# Patient Record
Sex: Male | Born: 1935 | Race: White | Hispanic: No | Marital: Married | Smoking: Former smoker
Health system: Southern US, Community
[De-identification: ages and names within clinical notes are randomized; demographics above are authoritative.]

## PROBLEM LIST (undated history)

## (undated) DIAGNOSIS — F419 Anxiety disorder, unspecified: Secondary | ICD-10-CM

## (undated) DIAGNOSIS — D126 Benign neoplasm of colon, unspecified: Secondary | ICD-10-CM

## (undated) DIAGNOSIS — Z951 Presence of aortocoronary bypass graft: Secondary | ICD-10-CM

## (undated) DIAGNOSIS — C9140 Hairy cell leukemia not having achieved remission: Secondary | ICD-10-CM

## (undated) DIAGNOSIS — R351 Nocturia: Secondary | ICD-10-CM

## (undated) DIAGNOSIS — E119 Type 2 diabetes mellitus without complications: Secondary | ICD-10-CM

## (undated) DIAGNOSIS — K644 Residual hemorrhoidal skin tags: Secondary | ICD-10-CM

## (undated) DIAGNOSIS — R350 Frequency of micturition: Secondary | ICD-10-CM

## (undated) DIAGNOSIS — Z8679 Personal history of other diseases of the circulatory system: Secondary | ICD-10-CM

## (undated) DIAGNOSIS — R3915 Urgency of urination: Secondary | ICD-10-CM

## (undated) DIAGNOSIS — I2510 Atherosclerotic heart disease of native coronary artery without angina pectoris: Secondary | ICD-10-CM

## (undated) DIAGNOSIS — H3530 Unspecified macular degeneration: Secondary | ICD-10-CM

## (undated) DIAGNOSIS — E785 Hyperlipidemia, unspecified: Secondary | ICD-10-CM

## (undated) DIAGNOSIS — Z87442 Personal history of urinary calculi: Secondary | ICD-10-CM

## (undated) DIAGNOSIS — I2089 Other forms of angina pectoris: Secondary | ICD-10-CM

## (undated) DIAGNOSIS — K219 Gastro-esophageal reflux disease without esophagitis: Secondary | ICD-10-CM

## (undated) DIAGNOSIS — Z8719 Personal history of other diseases of the digestive system: Secondary | ICD-10-CM

## (undated) DIAGNOSIS — H6980 Other specified disorders of Eustachian tube, unspecified ear: Secondary | ICD-10-CM

## (undated) DIAGNOSIS — K5730 Diverticulosis of large intestine without perforation or abscess without bleeding: Secondary | ICD-10-CM

## (undated) DIAGNOSIS — I252 Old myocardial infarction: Secondary | ICD-10-CM

## (undated) DIAGNOSIS — I10 Essential (primary) hypertension: Secondary | ICD-10-CM

## (undated) DIAGNOSIS — N320 Bladder-neck obstruction: Secondary | ICD-10-CM

## (undated) DIAGNOSIS — E781 Pure hyperglyceridemia: Secondary | ICD-10-CM

## (undated) DIAGNOSIS — H269 Unspecified cataract: Secondary | ICD-10-CM

## (undated) DIAGNOSIS — L03113 Cellulitis of right upper limb: Secondary | ICD-10-CM

## (undated) DIAGNOSIS — I7300 Raynaud's syndrome without gangrene: Secondary | ICD-10-CM

## (undated) DIAGNOSIS — G4700 Insomnia, unspecified: Secondary | ICD-10-CM

## (undated) DIAGNOSIS — Z8546 Personal history of malignant neoplasm of prostate: Secondary | ICD-10-CM

## (undated) DIAGNOSIS — I208 Other forms of angina pectoris: Secondary | ICD-10-CM

## (undated) DIAGNOSIS — M1990 Unspecified osteoarthritis, unspecified site: Secondary | ICD-10-CM

## (undated) HISTORY — DX: Personal history of other diseases of the circulatory system: Z86.79

## (undated) HISTORY — DX: Pure hyperglyceridemia: E78.1

## (undated) HISTORY — DX: Gastro-esophageal reflux disease without esophagitis: K21.9

## (undated) HISTORY — DX: Atherosclerotic heart disease of native coronary artery without angina pectoris: I25.10

## (undated) HISTORY — DX: Essential (primary) hypertension: I10

## (undated) HISTORY — PX: CORONARY ARTERY BYPASS GRAFT: SHX141

## (undated) HISTORY — PX: CARDIAC CATHETERIZATION: SHX172

## (undated) HISTORY — DX: Diverticulosis of large intestine without perforation or abscess without bleeding: K57.30

## (undated) HISTORY — DX: Raynaud's syndrome without gangrene: I73.00

## (undated) HISTORY — DX: Anxiety disorder, unspecified: F41.9

## (undated) HISTORY — DX: Hyperlipidemia, unspecified: E78.5

## (undated) HISTORY — DX: Residual hemorrhoidal skin tags: K64.4

## (undated) HISTORY — DX: Hairy cell leukemia not having achieved remission: C91.40

## (undated) HISTORY — DX: Other specified disorders of Eustachian tube, unspecified ear: H69.80

## (undated) HISTORY — DX: Type 2 diabetes mellitus without complications: E11.9

## (undated) HISTORY — DX: Unspecified macular degeneration: H35.30

## (undated) HISTORY — DX: Benign neoplasm of colon, unspecified: D12.6

## (undated) HISTORY — DX: Insomnia, unspecified: G47.00

## (undated) HISTORY — DX: Cellulitis of right upper limb: L03.113

---
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
## 1999-04-16 ENCOUNTER — Ambulatory Visit (HOSPITAL_COMMUNITY): Admission: RE | Admit: 1999-04-16 | Discharge: 1999-04-16 | Payer: Self-pay | Admitting: Internal Medicine

## 1999-04-16 ENCOUNTER — Encounter: Payer: Self-pay | Admitting: Internal Medicine

## 1999-08-21 ENCOUNTER — Other Ambulatory Visit: Admission: RE | Admit: 1999-08-21 | Discharge: 1999-08-21 | Payer: Self-pay | Admitting: Gastroenterology

## 1999-08-21 ENCOUNTER — Encounter (INDEPENDENT_AMBULATORY_CARE_PROVIDER_SITE_OTHER): Payer: Self-pay | Admitting: Specialist

## 2000-01-14 HISTORY — PX: DEBRIDEMENT TENNIS ELBOW: SHX1442

## 2002-03-01 ENCOUNTER — Ambulatory Visit (HOSPITAL_COMMUNITY): Admission: RE | Admit: 2002-03-01 | Discharge: 2002-03-01 | Payer: Self-pay | Admitting: Gastroenterology

## 2002-03-01 ENCOUNTER — Encounter: Payer: Self-pay | Admitting: Gastroenterology

## 2003-05-29 ENCOUNTER — Encounter: Admission: RE | Admit: 2003-05-29 | Discharge: 2003-05-29 | Payer: Self-pay | Admitting: Internal Medicine

## 2003-07-07 ENCOUNTER — Ambulatory Visit (HOSPITAL_COMMUNITY): Admission: RE | Admit: 2003-07-07 | Discharge: 2003-07-07 | Payer: Self-pay | Admitting: Gastroenterology

## 2003-09-05 ENCOUNTER — Encounter (INDEPENDENT_AMBULATORY_CARE_PROVIDER_SITE_OTHER): Payer: Self-pay | Admitting: Gastroenterology

## 2003-09-05 ENCOUNTER — Encounter: Payer: Self-pay | Admitting: Gastroenterology

## 2003-09-05 DIAGNOSIS — K5730 Diverticulosis of large intestine without perforation or abscess without bleeding: Secondary | ICD-10-CM

## 2003-09-05 HISTORY — DX: Diverticulosis of large intestine without perforation or abscess without bleeding: K57.30

## 2004-01-14 DIAGNOSIS — Z951 Presence of aortocoronary bypass graft: Secondary | ICD-10-CM

## 2004-01-14 HISTORY — DX: Presence of aortocoronary bypass graft: Z95.1

## 2004-01-24 ENCOUNTER — Ambulatory Visit: Payer: Self-pay | Admitting: Internal Medicine

## 2004-04-22 ENCOUNTER — Inpatient Hospital Stay (HOSPITAL_COMMUNITY): Admission: EM | Admit: 2004-04-22 | Discharge: 2004-05-01 | Payer: Self-pay | Admitting: Emergency Medicine

## 2004-04-22 ENCOUNTER — Ambulatory Visit: Payer: Self-pay | Admitting: Cardiology

## 2004-04-23 HISTORY — PX: CARDIAC CATHETERIZATION: SHX172

## 2004-05-07 ENCOUNTER — Encounter: Admission: RE | Admit: 2004-05-07 | Discharge: 2004-05-07 | Payer: Self-pay | Admitting: Cardiothoracic Surgery

## 2004-05-21 ENCOUNTER — Ambulatory Visit: Payer: Self-pay | Admitting: Cardiology

## 2004-05-31 ENCOUNTER — Ambulatory Visit: Payer: Self-pay | Admitting: Cardiology

## 2004-06-03 ENCOUNTER — Encounter (HOSPITAL_COMMUNITY): Admission: RE | Admit: 2004-06-03 | Discharge: 2004-09-01 | Payer: Self-pay | Admitting: Cardiology

## 2004-06-07 ENCOUNTER — Encounter: Admission: RE | Admit: 2004-06-07 | Discharge: 2004-07-17 | Payer: Self-pay | Admitting: Cardiothoracic Surgery

## 2004-07-04 ENCOUNTER — Ambulatory Visit: Payer: Self-pay | Admitting: Cardiology

## 2004-07-19 ENCOUNTER — Ambulatory Visit: Payer: Self-pay | Admitting: Internal Medicine

## 2004-07-23 ENCOUNTER — Encounter: Admission: RE | Admit: 2004-07-23 | Discharge: 2004-07-23 | Payer: Self-pay | Admitting: Internal Medicine

## 2004-08-15 ENCOUNTER — Ambulatory Visit: Payer: Self-pay | Admitting: Internal Medicine

## 2004-09-02 ENCOUNTER — Encounter (HOSPITAL_COMMUNITY): Admission: RE | Admit: 2004-09-02 | Discharge: 2004-09-30 | Payer: Self-pay | Admitting: Cardiology

## 2004-10-16 ENCOUNTER — Ambulatory Visit: Payer: Self-pay | Admitting: Gastroenterology

## 2004-10-21 ENCOUNTER — Ambulatory Visit (HOSPITAL_COMMUNITY): Admission: RE | Admit: 2004-10-21 | Discharge: 2004-10-21 | Payer: Self-pay | Admitting: Gastroenterology

## 2005-02-05 ENCOUNTER — Encounter (INDEPENDENT_AMBULATORY_CARE_PROVIDER_SITE_OTHER): Payer: Self-pay | Admitting: Specialist

## 2005-02-05 ENCOUNTER — Ambulatory Visit (HOSPITAL_COMMUNITY): Admission: RE | Admit: 2005-02-05 | Discharge: 2005-02-05 | Payer: Self-pay | Admitting: *Deleted

## 2005-02-05 HISTORY — PX: LAPAROSCOPIC CHOLECYSTECTOMY: SUR755

## 2005-02-14 ENCOUNTER — Ambulatory Visit: Payer: Self-pay | Admitting: Internal Medicine

## 2005-03-27 ENCOUNTER — Ambulatory Visit: Payer: Self-pay | Admitting: Internal Medicine

## 2005-04-02 ENCOUNTER — Ambulatory Visit: Payer: Self-pay | Admitting: Internal Medicine

## 2005-08-20 ENCOUNTER — Ambulatory Visit: Payer: Self-pay | Admitting: Gastroenterology

## 2005-08-26 ENCOUNTER — Ambulatory Visit: Payer: Self-pay | Admitting: Gastroenterology

## 2005-08-26 ENCOUNTER — Encounter (INDEPENDENT_AMBULATORY_CARE_PROVIDER_SITE_OTHER): Payer: Self-pay | Admitting: Specialist

## 2005-09-10 ENCOUNTER — Ambulatory Visit: Payer: Self-pay | Admitting: Gastroenterology

## 2005-10-28 ENCOUNTER — Ambulatory Visit: Payer: Self-pay | Admitting: Internal Medicine

## 2005-11-06 ENCOUNTER — Ambulatory Visit: Payer: Self-pay | Admitting: Internal Medicine

## 2005-11-07 ENCOUNTER — Ambulatory Visit: Payer: Self-pay | Admitting: Internal Medicine

## 2005-11-07 LAB — CONVERTED CEMR LAB
ALT: 40 units/L (ref 0–40)
AST: 29 units/L (ref 0–37)
Basophils Relative: 0.4 % (ref 0.0–1.0)
Eosinophil percent: 1.8 % (ref 0.0–5.0)
MCV: 92 fL (ref 78.0–100.0)
Monocytes Absolute: 1.1 10*3/uL — ABNORMAL HIGH (ref 0.2–0.7)
Monocytes Relative: 18 % — ABNORMAL HIGH (ref 3.0–11.0)
Neutro Abs: 3.1 10*3/uL (ref 1.4–7.7)
Platelets: 191 10*3/uL (ref 150–400)
Potassium: 3.7 meq/L (ref 3.5–5.1)
RBC: 5.75 M/uL (ref 4.22–5.81)
WBC: 6.3 10*3/uL (ref 4.5–10.5)

## 2006-04-02 ENCOUNTER — Ambulatory Visit (HOSPITAL_COMMUNITY): Admission: RE | Admit: 2006-04-02 | Discharge: 2006-04-02 | Payer: Self-pay | Admitting: Urology

## 2006-04-14 ENCOUNTER — Ambulatory Visit: Admission: RE | Admit: 2006-04-14 | Discharge: 2006-06-03 | Payer: Self-pay | Admitting: Radiation Oncology

## 2006-04-20 DIAGNOSIS — E1169 Type 2 diabetes mellitus with other specified complication: Secondary | ICD-10-CM | POA: Insufficient documentation

## 2006-04-20 DIAGNOSIS — E785 Hyperlipidemia, unspecified: Secondary | ICD-10-CM

## 2006-05-11 ENCOUNTER — Encounter (INDEPENDENT_AMBULATORY_CARE_PROVIDER_SITE_OTHER): Payer: Self-pay | Admitting: Specialist

## 2006-05-11 ENCOUNTER — Inpatient Hospital Stay (HOSPITAL_COMMUNITY): Admission: RE | Admit: 2006-05-11 | Discharge: 2006-05-13 | Payer: Self-pay | Admitting: Urology

## 2006-05-11 HISTORY — PX: ROBOT ASSISTED LAPAROSCOPIC RADICAL PROSTATECTOMY: SHX5141

## 2006-07-29 ENCOUNTER — Inpatient Hospital Stay (HOSPITAL_COMMUNITY): Admission: EM | Admit: 2006-07-29 | Discharge: 2006-07-31 | Payer: Self-pay | Admitting: Emergency Medicine

## 2006-07-30 ENCOUNTER — Encounter: Payer: Self-pay | Admitting: Internal Medicine

## 2006-08-21 ENCOUNTER — Encounter: Payer: Self-pay | Admitting: Internal Medicine

## 2006-09-30 ENCOUNTER — Telehealth (INDEPENDENT_AMBULATORY_CARE_PROVIDER_SITE_OTHER): Payer: Self-pay | Admitting: *Deleted

## 2006-11-12 ENCOUNTER — Ambulatory Visit: Payer: Self-pay | Admitting: Internal Medicine

## 2006-11-12 DIAGNOSIS — F411 Generalized anxiety disorder: Secondary | ICD-10-CM | POA: Insufficient documentation

## 2006-11-20 ENCOUNTER — Encounter: Payer: Self-pay | Admitting: Internal Medicine

## 2006-11-24 ENCOUNTER — Encounter: Payer: Self-pay | Admitting: Internal Medicine

## 2007-02-15 ENCOUNTER — Telehealth (INDEPENDENT_AMBULATORY_CARE_PROVIDER_SITE_OTHER): Payer: Self-pay | Admitting: *Deleted

## 2007-04-08 ENCOUNTER — Telehealth: Payer: Self-pay | Admitting: Internal Medicine

## 2007-04-09 ENCOUNTER — Ambulatory Visit: Payer: Self-pay | Admitting: Internal Medicine

## 2007-04-09 DIAGNOSIS — J019 Acute sinusitis, unspecified: Secondary | ICD-10-CM | POA: Insufficient documentation

## 2007-05-20 ENCOUNTER — Encounter: Payer: Self-pay | Admitting: Internal Medicine

## 2007-06-15 ENCOUNTER — Telehealth (INDEPENDENT_AMBULATORY_CARE_PROVIDER_SITE_OTHER): Payer: Self-pay | Admitting: *Deleted

## 2007-10-12 ENCOUNTER — Telehealth (INDEPENDENT_AMBULATORY_CARE_PROVIDER_SITE_OTHER): Payer: Self-pay | Admitting: *Deleted

## 2007-10-18 ENCOUNTER — Ambulatory Visit: Payer: Self-pay | Admitting: Internal Medicine

## 2007-10-21 ENCOUNTER — Telehealth (INDEPENDENT_AMBULATORY_CARE_PROVIDER_SITE_OTHER): Payer: Self-pay | Admitting: *Deleted

## 2007-11-18 ENCOUNTER — Encounter: Payer: Self-pay | Admitting: Internal Medicine

## 2007-11-18 LAB — CONVERTED CEMR LAB
AST: 34 units/L
Albumin: 4.1 g/dL
Alkaline Phosphatase: 56 units/L
BUN: 16 mg/dL
CO2, serum: 28 mmol/L
Creatinine, Ser: 1 mg/dL
Glucose, Bld: 108 mg/dL
HDL: 53 mg/dL
Potassium, serum: 4.6 mmol/L
Total Bilirubin: 0.8 mg/dL
Total Protein: 6.8 g/dL

## 2007-11-24 ENCOUNTER — Encounter: Payer: Self-pay | Admitting: Internal Medicine

## 2007-12-14 DIAGNOSIS — K644 Residual hemorrhoidal skin tags: Secondary | ICD-10-CM | POA: Insufficient documentation

## 2007-12-14 HISTORY — DX: Residual hemorrhoidal skin tags: K64.4

## 2007-12-15 ENCOUNTER — Ambulatory Visit: Payer: Self-pay | Admitting: Gastroenterology

## 2007-12-15 DIAGNOSIS — Z860100 Personal history of colon polyps, unspecified: Secondary | ICD-10-CM | POA: Insufficient documentation

## 2007-12-15 DIAGNOSIS — K219 Gastro-esophageal reflux disease without esophagitis: Secondary | ICD-10-CM | POA: Insufficient documentation

## 2008-01-25 ENCOUNTER — Ambulatory Visit: Payer: Self-pay | Admitting: Gastroenterology

## 2008-02-21 ENCOUNTER — Encounter: Payer: Self-pay | Admitting: Gastroenterology

## 2008-02-21 ENCOUNTER — Encounter: Payer: Self-pay | Admitting: Internal Medicine

## 2008-04-21 ENCOUNTER — Telehealth: Payer: Self-pay | Admitting: Internal Medicine

## 2008-05-19 ENCOUNTER — Encounter: Payer: Self-pay | Admitting: Internal Medicine

## 2008-06-21 ENCOUNTER — Telehealth (INDEPENDENT_AMBULATORY_CARE_PROVIDER_SITE_OTHER): Payer: Self-pay | Admitting: *Deleted

## 2008-06-22 ENCOUNTER — Ambulatory Visit: Payer: Self-pay | Admitting: Internal Medicine

## 2008-06-22 DIAGNOSIS — H6980 Other specified disorders of Eustachian tube, unspecified ear: Secondary | ICD-10-CM

## 2008-06-22 DIAGNOSIS — H6990 Unspecified Eustachian tube disorder, unspecified ear: Secondary | ICD-10-CM

## 2008-06-22 HISTORY — DX: Other specified disorders of Eustachian tube, unspecified ear: H69.80

## 2008-06-22 HISTORY — DX: Unspecified eustachian tube disorder, unspecified ear: H69.90

## 2008-07-24 ENCOUNTER — Encounter: Payer: Self-pay | Admitting: Internal Medicine

## 2008-08-09 ENCOUNTER — Telehealth (INDEPENDENT_AMBULATORY_CARE_PROVIDER_SITE_OTHER): Payer: Self-pay | Admitting: *Deleted

## 2008-08-16 ENCOUNTER — Telehealth (INDEPENDENT_AMBULATORY_CARE_PROVIDER_SITE_OTHER): Payer: Self-pay | Admitting: *Deleted

## 2008-10-09 ENCOUNTER — Encounter: Payer: Self-pay | Admitting: Internal Medicine

## 2008-10-23 ENCOUNTER — Ambulatory Visit: Payer: Self-pay | Admitting: Internal Medicine

## 2008-11-03 ENCOUNTER — Ambulatory Visit: Payer: Self-pay | Admitting: Internal Medicine

## 2008-11-03 ENCOUNTER — Encounter (INDEPENDENT_AMBULATORY_CARE_PROVIDER_SITE_OTHER): Payer: Self-pay | Admitting: *Deleted

## 2008-11-03 LAB — CONVERTED CEMR LAB
OCCULT 1: NEGATIVE
OCCULT 3: NEGATIVE

## 2008-11-21 ENCOUNTER — Encounter: Payer: Self-pay | Admitting: Internal Medicine

## 2008-11-22 ENCOUNTER — Encounter: Payer: Self-pay | Admitting: Internal Medicine

## 2009-02-28 ENCOUNTER — Telehealth (INDEPENDENT_AMBULATORY_CARE_PROVIDER_SITE_OTHER): Payer: Self-pay | Admitting: *Deleted

## 2009-03-21 ENCOUNTER — Telehealth (INDEPENDENT_AMBULATORY_CARE_PROVIDER_SITE_OTHER): Payer: Self-pay | Admitting: *Deleted

## 2009-04-10 ENCOUNTER — Encounter: Payer: Self-pay | Admitting: Internal Medicine

## 2009-06-01 ENCOUNTER — Encounter: Payer: Self-pay | Admitting: Internal Medicine

## 2009-07-10 ENCOUNTER — Telehealth (INDEPENDENT_AMBULATORY_CARE_PROVIDER_SITE_OTHER): Payer: Self-pay | Admitting: *Deleted

## 2009-08-15 ENCOUNTER — Ambulatory Visit: Payer: Self-pay | Admitting: Internal Medicine

## 2009-08-15 DIAGNOSIS — R238 Other skin changes: Secondary | ICD-10-CM | POA: Insufficient documentation

## 2009-08-17 LAB — CONVERTED CEMR LAB
AST: 43 units/L — ABNORMAL HIGH (ref 0–37)
Albumin: 4.1 g/dL (ref 3.5–5.2)
Alkaline Phosphatase: 45 units/L (ref 39–117)
Basophils Relative: 0.2 % (ref 0.0–3.0)
Bilirubin, Direct: 0.2 mg/dL (ref 0.0–0.3)
MCHC: 34.5 g/dL (ref 30.0–36.0)
Prothrombin Time: 11.8 s (ref 9.7–11.8)
RDW: 13.5 % (ref 11.5–14.6)
Total Bilirubin: 0.8 mg/dL (ref 0.3–1.2)
Total Protein: 6.6 g/dL (ref 6.0–8.3)
aPTT: 27.1 s (ref 21.7–28.8)

## 2009-09-20 ENCOUNTER — Telehealth (INDEPENDENT_AMBULATORY_CARE_PROVIDER_SITE_OTHER): Payer: Self-pay | Admitting: *Deleted

## 2009-10-11 ENCOUNTER — Encounter: Payer: Self-pay | Admitting: Internal Medicine

## 2009-10-12 ENCOUNTER — Ambulatory Visit: Payer: Self-pay | Admitting: Internal Medicine

## 2009-11-20 ENCOUNTER — Encounter: Payer: Self-pay | Admitting: Internal Medicine

## 2009-12-12 ENCOUNTER — Ambulatory Visit: Payer: Self-pay | Admitting: Cardiology

## 2009-12-12 ENCOUNTER — Encounter: Payer: Self-pay | Admitting: Internal Medicine

## 2009-12-19 ENCOUNTER — Telehealth (INDEPENDENT_AMBULATORY_CARE_PROVIDER_SITE_OTHER): Payer: Self-pay | Admitting: *Deleted

## 2009-12-20 ENCOUNTER — Ambulatory Visit: Payer: Self-pay

## 2009-12-20 ENCOUNTER — Encounter (HOSPITAL_COMMUNITY)
Admission: RE | Admit: 2009-12-20 | Discharge: 2010-02-12 | Payer: Self-pay | Source: Home / Self Care | Attending: Cardiology | Admitting: Cardiology

## 2009-12-20 ENCOUNTER — Encounter: Payer: Self-pay | Admitting: Internal Medicine

## 2010-01-28 ENCOUNTER — Telehealth (INDEPENDENT_AMBULATORY_CARE_PROVIDER_SITE_OTHER): Payer: Self-pay | Admitting: *Deleted

## 2010-02-12 NOTE — Letter (Signed)
Summary: [facility]  [facility]   Imported By: Lanelle Bal 10/22/2009 13:19:08  _____________________________________________________________________  External Attachment:    Type:   Image     Comment:   External Document

## 2010-02-12 NOTE — Letter (Signed)
Summary: [facility]   Imported By: Lanelle Bal 06/13/2009 12:40:55  _____________________________________________________________________  External Attachment:    Type:   Image     Comment:   External Document

## 2010-02-12 NOTE — Progress Notes (Signed)
Summary: Nuclear Pre-Procedure  Phone Note Outgoing Call   Call placed by: Milana Na, EMT-P,  December 19, 2009 3:34 PM Summary of Call: Left message with information on Myov[employer] Information Sheet (see scanned document for details).      Nuclear Med Background Indications for Stress Test: Evaluation for Ischemia, Graft Patency   History: CABG, Myocardial Infarction, Myocardial Perfusion Study  History Comments: '06 MI --CABG x4 '07 MPS NL Fixed Inf-Lat defect  Symptoms: Chest Pain    Nuclear Pre-Procedure Cardiac Risk Factors: History of Smoking, Hypertension, Lipids Height (in): 71  Nuclear Med Study Referring MD:  P.Jordan     Appended Document: Cardiology Nuclear Testing copy sent to Dr.Jordan

## 2010-02-12 NOTE — Progress Notes (Signed)
Summary: REFILL REQUEST  Phone Note Refill Request Call back at [phone] Message from:  Pharmacy on July 10, 2009 10:02 AM  Refills Requested: Medication #1:  LORAZEPAM 1 MG TABS TAKE ONE HALF TABLET BY MOUTH DAILY AT BEDTIME AS NEEDED   Dosage confirmed as above?Dosage Confirmed   Supply Requested: 1 month   Last Refilled: 05/08/[facility] [address]  Next Appointment Scheduled: NONE Initial call taken by: Lavell Islam,  July 10, 2009 10:03 AM    Prescriptions: LORAZEPAM 1 MG TABS (LORAZEPAM) TAKE ONE HALF TABLET BY MOUTH DAILY AT BEDTIME AS NEEDED  #30 x 0   Entered by:   Shonna Chock   Authorized by:   [name] MD   Signed by:   Shonna Chock on 07/10/2009   Method used:   Printed then faxed to ...       [id number] ([facility]       [address]       [place], [place]  [address]       Ph: [phone] or [phone]       Fax: [phone]   RxID:   [id number]

## 2010-02-12 NOTE — Progress Notes (Signed)
Summary: Refill Request  Phone Note Refill Request Call back at [phone] Message from:  Pharmacy on September 20, 2009 9:20 AM  Refills Requested: Medication #1:  LORAZEPAM 1 MG TABS TAKE ONE HALF TABLET BY MOUTH DAILY AT BEDTIME AS NEEDED   Dosage confirmed as above?Dosage Confirmed   Supply Requested: 1 month   Last [facility]: [facility] [address]  Next Appointment Scheduled: NONE Initial call taken by: Harold Barban,  September 20, 2009 9:20 AM    Prescriptions: LORAZEPAM 1 MG TABS (LORAZEPAM) TAKE ONE HALF TABLET BY MOUTH DAILY AT BEDTIME AS NEEDED  #30 x 1   Entered by:   Shonna Chock CMA   Authorized by:   [name] MD   Signed by:   Shonna Chock CMA on 09/20/2009   Method used:   Printed then faxed to ...       [id number] (retail)       [address]       [place], [place]  [address]       Ph: [phone] or [phone]       Fax: [phone]   RxID:   [id number]

## 2010-02-12 NOTE — Progress Notes (Signed)
Summary: refill  Phone Note Refill Request Message from:  Fax from Pharmacy on February 28, 2009 9:23 AM  Refills Requested: Medication #1:  LORAZEPAM 1 MG TABS TAKE ONE HALF TABLET BY MOUTH DAILY AT BEDTIME AS NEEDED [facility] [address] fax [phone]   Method Requested: Fax to Local Pharmacy Next Appointment Scheduled: no appt Initial call taken by: Barb Merino,  February 28, 2009 9:24 AM    Prescriptions: LORAZEPAM 1 MG TABS (LORAZEPAM) TAKE ONE HALF TABLET BY MOUTH DAILY AT BEDTIME AS NEEDED  #30 x 1   Entered by:   Shonna Chock   Authorized by:   [name] MD   Signed by:   Shonna Chock on 02/28/2009   Method used:   Printed then faxed to ...       [facility] ([facility]       [address]       [place], [place]  [address]       Ph: [phone] or [phone]       Fax: [phone]   RxID:   [id number]

## 2010-02-12 NOTE — Progress Notes (Signed)
Summary: Refill Request  Phone Note Refill Request Message from:  [facility] [address] Fax #: [phone]  Refills Requested: Medication #1:  FLUOXETINE HCL 10 MG  TABS 1qd   Dosage confirmed as above?Dosage Confirmed   Supply Requested: 3 months   Last Refilled: 12/19/2008 Next Appointment Scheduled: none Initial call taken by: Harold Barban,  March 21, 2009 1:45 PM    Prescriptions: FLUOXETINE HCL 10 MG  TABS (FLUOXETINE HCL) 1qd  #90 Tablet x 2   Entered by:   Shonna Chock   Authorized by:   [name] MD   Signed by:   Shonna Chock on 03/21/2009   Method used:   Electronically to        [facility] ([facility]ail[facility]       [address]       [place], [place]  [address]       Ph: [phone] or [phone]       Fax: [phone]   RxID:   [id number]

## 2010-02-12 NOTE — Assessment & Plan Note (Signed)
Summary: rash on arm/cbs   Vital Signs:  Patient profile:   76 year old male Height:      71 inches Weight:      201 pounds Temp:     98.0 degrees F oral Pulse rate:   80 / minute Resp:     15 per minute BP sitting:   130 / 70  (left arm)  Vitals Entered By: Jeremy Johann CMA (August 15, 2009 2:03 PM) CC: burning discolored spots on arm, discuss growth on skin   Primary Care Provider:  [name], MD  CC:  burning discolored spots on arm and discuss growth on skin.  History of Present Illness: He is concerned about enlarging " liver spots " on arms  with burning when in sun for past  24 hrs. Chronically he has had smaller , stable spots . Med list reviewed, not on Doxycycline or HCTZ. He is on "low dose ASA " once daily, not on Plavix. He drinks 3-4 glasses of wine   daily . He is on Simvastatin 80 mg at bedtime. He was concerned about cancer.  Current Medications (verified): 1)  Lorazepam 1 Mg Tabs (Lorazepam) .... Take One Half Tablet By Mouth Daily At Bedtime As Needed 2)  Metoprolol Tartrate 50 Mg Tabs (Metoprolol Tartrate) .... Take 1 Tablet By Mouth Once A Day 3)  Zocor 80 Mg Tabs (Simvastatin) .[name].. 1 By Mouth At Bedtime 4)  Diovan 80 Mg Tabs (Valsartan) .... Take 1 Tablet By Mouth Once A Day 5)  Isosorbide Mononitrate Cr 60 Mg Xr24h-Tab (Isosorbide Mononitrate) .... Take 1 Tablet By Mouth Once A Day 6)  Adult Aspirin Ec Low Strength 81 Mg Tbec (Aspirin) .... Take 1 Tablet By Mouth Once A Day\par 7)  Multivitamins  Tabs (Multiple Vitamin) .... Take 1 Tablet By Mouth Once A Day 8)  Xalatan 0.005 %  Soln (Latanoprost) .... As Directed 9)  Fluoxetine Hcl 10 Mg  Tabs (Fluoxetine Hcl) .[name].. 1qd 10)  Prilosec Otc 20 Mg Tbec (Omeprazole Magnesium) .[name].. 1 By Mouth Once Daily 11)  Sucralfate 1 Gm Tabs (Sucralfate) .[name].. 1 Tablet in 5cc Water Three Times A Day Pre Meals  Allergies (verified): 1)  ! * Clindamycin  Review of Systems ENT:  Denies nosebleeds. Resp:  Denies  coughing up blood. GI:  Denies bloody stools and dark tarry stools. GU:  Denies hematuria.  Physical Exam  General:  in no acute distress; alert,appropriate and cooperative throughout examination Eyes:  No corneal or conjunctival inflammation noted.No icterus Abdomen:  Bowel sounds positive,abdomen soft and non-tender without masses, organomegaly . Ventral hernia present Skin:  Ecchymoses L forearm . Keratoses L posterior  ear & L back Cervical Nodes:  No lymphadenopathy noted Axillary Nodes:  No palpable lymphadenopathy   Impression & Recommendations:  Problem # 1:  ECCHYMOSES (ICD-782.9)  Orders: Venipuncture ([address]) TLB-CBC Platelet - w/Differential (85025-CBCD) TLB-Hepatic/Liver Function Pnl (80076-HEPATIC) TLB-PT (Protime) (85610-PTP) TLB-PTT (85730-PTTL)  Complete Medication List: 1)  Lorazepam 1 Mg Tabs (Lorazepam) .... Take one half tablet by mouth daily at bedtime as needed 2)  Metoprolol Tartrate 50 Mg Tabs (Metoprolol tartrate) .... Take 1 tablet by mouth once a day 3)  Zocor 80 Mg Tabs (Simvastatin) .[name].. 1 by mouth at bedtime 4)  Diovan 80 Mg Tabs (Valsartan) .... Take 1 tablet by mouth once a day 5)  Isosorbide Mononitrate Cr 60 Mg Xr24h-tab (Isosorbide mononitrate) .... Take 1 tablet by mouth once a day 6)  Adult Aspirin Ec Low Strength  81 Mg Tbec (Aspirin) .... Take 1 tablet by mouth once a day\par 7)  Multivitamins Tabs (Multiple vitamin) .... Take 1 tablet by mouth once a day 8)  Xalatan 0.005 % Soln (Latanoprost) .... As directed 9)  Fluoxetine Hcl 10 Mg Tabs (Fluoxetine hcl) .[name].. 1qd 10)  Prilosec Otc 20 Mg Tbec (Omeprazole magnesium) .[name].. 1 by mouth once daily 11)  Sucralfate 1 Gm Tabs (Sucralfate) .[name].. 1 tablet in 5cc water three times a day pre meals  Patient Instructions: 1)  Please share note & labs with Dr [place] when seen  Appended Document: rash on arm/cbs

## 2010-02-12 NOTE — Letter (Signed)
Summary: [facility]  [facility]   Imported By: Lanelle Bal 04/17/2009 09:43:13  _____________________________________________________________________  External Attachment:    Type:   Image     Comment:   External Document

## 2010-02-14 NOTE — Progress Notes (Signed)
Summary: Refill Request  Phone Note Refill Request Message from:  Pharmacy on January 28, 2010 10:54 AM  Refills Requested: Medication #1:  LORAZEPAM 1 MG TABS TAKE ONE HALF TABLET BY MOUTH DAILY AT BEDTIME AS NEEDED   Dosage confirmed as above?Dosage Confirmed   Supply Requested: 30   [facility] [address]   Next Appointment Scheduled: none Initial call taken by: Harold Barban,  January 28, 2010 10:54 AM    New/Updated Medications: LORAZEPAM 1 MG TABS (LORAZEPAM) 1/2 by mouth at bedtime as needed **APPOINTMENT DUE** Prescriptions: LORAZEPAM 1 MG TABS (LORAZEPAM) 1/2 by mouth at bedtime as needed **APPOINTMENT DUE**  #15 x 0   Entered by:   Shonna Chock CMA   Authorized by:   [name] MD   Signed by:   Shonna Chock CMA on 01/28/2010   Method used:   Printed then faxed to ...       [facility]       [address]       [place], [place]  [address]       Ph: [phone] or [phone]       Fax: [phone]   RxID:   [id number]  Patient needs to schdule CPX ( appointment)

## 2010-02-14 NOTE — Assessment & Plan Note (Signed)
Summary: Cardiology Nuclear Testing  Nuclear Med Background Indications for Stress Test: Evaluation for Ischemia, Graft Patency   History: CABG, Myocardial Infarction, Myocardial Perfusion Study  History Comments: '06 MI --CABG x4 '07 MPS NL Fixed Inf-Lat defect  Symptoms: Chest Pain, DOE, SOB    Nuclear Pre-Procedure Cardiac Risk Factors: History of Smoking, Hypertension, Lipids Caffeine/Decaff Intake: None NPO After: 8:00 AM Lungs: clear IV 0.9% NS with Angio Cath: 22g     IV Site: R Antecubital IV Started by: Bonnita Levan, RN Chest Size (in) 44     Height (in): 71 Weight (lb): 208 BMI: 29.11  Nuclear Med Study 1 or 2 day study:  1 day     Stress Test Type:  Treadmill/Lexiscan Reading MD:  Arvilla Meres, MD     Referring MD:  P.Jordan Resting Radionuclide:  Technetium 28m Tetrofosmin     Resting Radionuclide Dose:  11 mCi  Stress Radionuclide:  Technetium 14m Tetrofosmin     Stress Radionuclide Dose:  32 mCi   Stress Protocol Exercise Time (min):  6:02 min     Max HR:  134 bpm     Predicted Max HR:  147 bpm  Max Systolic BP: 193 mm Hg     Percent Max HR:  91.16 %     METS: 7.0 Rate Pressure Product:  [address]  Lexiscan: 0.4 mg   Stress Test Technologist:  Milana Na, EMT-P     Nuclear Technologist:  Doyne Keel, CNMT  Rest Procedure  Myocardial perfusion imaging was performed at rest 45 minutes following the intravenous administration of Technetium 35m Tetrofosmin.  Stress Procedure  The patient exercised for 6:02. The patient stopped due to fatigue, sob, and denied any chest pain.  There were non specific ST-T wave changes.  Technetium 9m Tetrofosmin was injected at peak exercise and myocardial perfusion imaging was performed after a brief delay.  QPS Raw Data Images:  Normal; no motion artifact; normal heart/lung ratio. Stress Images:  There is decreased uptake in the inferolateral wall Rest Images:  There is decreased uptake in the inferolateral  wall Subtraction (SDS):  Possible previous small inferolateral infarct with mild to moderate reversibility suggestive of ischemia. Transient Ischemic Dilatation:  1.12  (Normal <1.22)  Lung/Heart Ratio:  .35  (Normal <0.45)  Quantitative Gated Spect Images QGS EDV:  90 ml QGS ESV:  26 ml QGS EF:  72 % QGS cine images:  Normal  Findings Low risk nuclear study Evidence for inferior ischemia      Overall Impression  Exercise Capacity: Lexiscan with low level exercise Clinical Symptoms: There is dyspnea. ECG Impression: No significant ST segment change suggestive of ischemia. Overall Impression: Low risk stress nuclear study. Overall Impression Comments: Possible previous small inferolateral infarct with mild to moderate reversibility suggestive of ischemia.  Appended Document: Cardiology Nuclear Testing copy sent to Dr.Jordan

## 2010-02-14 NOTE — Letter (Signed)
Summary: [facility]   Imported By: Lanelle Bal 12/24/2009 09:34:18  _____________________________________________________________________  External Attachment:    Type:   Image     Comment:   External Document

## 2010-02-22 ENCOUNTER — Telehealth (INDEPENDENT_AMBULATORY_CARE_PROVIDER_SITE_OTHER): Payer: Self-pay | Admitting: *Deleted

## 2010-02-28 NOTE — Progress Notes (Signed)
Summary: Refill Request  Phone Note Refill Request Call back at [phone] Message from:  Pharmacy on February 22, 2010 3:36 PM  Refills Requested: Medication #1:  LORAZEPAM 1 MG TABS 1/2 by mouth at bedtime as needed **APPOINTMENT DUE**   Dosage confirmed as above?Dosage Confirmed   Supply Requested: 15   [facility] [address]   Next Appointment Scheduled: none Initial call taken by: Harold Barban,  February 22, 2010 3:36 PM  Follow-up for Phone Call        OK X 1 Follow-up by: [name] MD,  February 22, 2010 5:10 PM    Prescriptions: LORAZEPAM 1 MG TABS (LORAZEPAM) 1/2 by mouth at bedtime as needed **APPOINTMENT DUE**  #15 x 0   Entered by:   Shonna Chock CMA   Authorized by:   [name] MD   Signed by:   Shonna Chock CMA on 02/22/2010   Method used:   Printed then faxed to ...       [facility]       [address]       [place], [place]  [address]       Ph: [phone] or [phone]       Fax: [phone]   RxID:   [id number]

## 2010-03-14 ENCOUNTER — Other Ambulatory Visit: Payer: Self-pay

## 2010-03-25 ENCOUNTER — Telehealth (INDEPENDENT_AMBULATORY_CARE_PROVIDER_SITE_OTHER): Payer: Self-pay | Admitting: *Deleted

## 2010-04-02 NOTE — Progress Notes (Signed)
Summary: REFILL  Phone Note Refill Request Message from:  Pharmacy on March 25, 2010 12:43 PM  Refills Requested: Medication #1:  LORAZEPAM 1 MG TABS 1/2 by mouth at bedtime as needed **APPOINTMENT DUE**   Dosage confirmed as above?Dosage Confirmed   Supply Requested: 1 month [facility] [address]  Next Appointment Scheduled: NONE Initial call taken by: Lavell Islam,  March 25, 2010 12:44 PM    Prescriptions: LORAZEPAM 1 MG TABS (LORAZEPAM) 1/2 by mouth at bedtime as needed **APPOINTMENT DUE**  #15 x 0   Entered by:   Shonna Chock CMA   Authorized by:   [name] MD   Signed by:   Shonna Chock CMA on 03/25/2010   Method used:   Printed then faxed to ...       [facility]       [address]       [place], [place]  [address]       Ph: [phone] or [phone]       Fax: [phone]   RxID:   [id number]

## 2010-04-18 ENCOUNTER — Other Ambulatory Visit (INDEPENDENT_AMBULATORY_CARE_PROVIDER_SITE_OTHER): Payer: Medicare Other | Admitting: *Deleted

## 2010-04-18 ENCOUNTER — Telehealth: Payer: Self-pay | Admitting: Cardiology

## 2010-04-18 DIAGNOSIS — Z79899 Other long term (current) drug therapy: Secondary | ICD-10-CM

## 2010-04-18 LAB — HEPATIC FUNCTION PANEL: Total Bilirubin: 0.7 mg/dL (ref 0.3–1.2)

## 2010-04-18 NOTE — Telephone Encounter (Signed)
LM that he is scheduled to be seen in June but if having problems now to call back. Will notify him of lab results.

## 2010-04-18 NOTE — Telephone Encounter (Signed)
PATIENT WAS HERE FOR LAB THIS MORNING AND WANTED TO KNOW WHEN HE NEEDS TO COME BACK. DICTATION FROM LAST OV DOES NOT INDICATE THIS. PT SAID HAS BEEN HAVING PROBLEMS. NOT SURE IF HE COULD WAIT FOR NEXT AVAILABLE. PT ASK THAT ANITA CALL. PLACED CHART IN BOX.

## 2010-04-25 ENCOUNTER — Other Ambulatory Visit: Payer: Self-pay | Admitting: *Deleted

## 2010-04-25 MED ORDER — LORAZEPAM 1 MG PO TABS: 1.0000 mg | ORAL_TABLET | Freq: Three times a day (TID) | ORAL | Status: DC | PRN

## 2010-04-30 ENCOUNTER — Telehealth: Payer: Self-pay | Admitting: *Deleted

## 2010-04-30 NOTE — Telephone Encounter (Signed)
Message copied by Murrell Redden on Tue Apr 30, 2010 10:28 AM ------      Message from: [place], PETER      Created: Sat Apr 27, 2010  2:13 PM       Transaminases are mildly elevated but stable. Please report.

## 2010-04-30 NOTE — Telephone Encounter (Signed)
Notified of HFT's. Sent to Dr. [name]

## 2010-05-28 NOTE — Discharge Summary (Signed)
NAMEDAMONEY, JULIA NO.:  [id number]   MEDICAL RECORD NO.:  [id number]          PATIENT TYPE:  INP   LOCATION:  1424                         FACILITY:  [facility]   PHYSICIAN:  Valetta Fuller, M.D.  DATE OF BIRTH:  08/03/1934   DATE OF ADMISSION:  05/11/2006  DATE OF DISCHARGE:  05/13/2006                               DISCHARGE SUMMARY   DISCHARGE DIAGNOSIS:  Adenocarcinoma of prostate pathologic stage  PT3AN0MX.   PROCEDURE PERFORMED:  Robotic assisted laparoscopic radical retropubic  prostatectomy with bilateral pelvic lymph node dissection on May 11, 2006.   HOSPITAL COURSE:  Mr. Couey is 76 year old male originally sent to [place]  through the courtesy of Dr. Patsi Sears for management of clinical stage  T1C adenocarcinoma of the prostate.  The patient ended up having  relatively high-volume tumor in the right lobe of his prostate with a  primary Gleason 4 component.  He had lower volume disease on the left  side primarily with the Gleason 3 plus 3 equals 6 disease.  The patient  had no evidence of metastatic disease on preoperative evaluation.  He  had undergone extensive consultation and elected to have a laparoscopic-  assisted robotic prostatectomy.  That surgery was done on May 11, 2006.  Surgery itself really was unremarkable and the patient did very  well with relatively minimal blood loss.  The patient's final pathology  revealed tumor involving both lobes.  There was extracapsular extension  of tumor with involvement of the surgical margins.  Seminal vesicles and  lymph nodes were negative.  He was felt to be pathologic stage PT3A.  The patient's postoperative course was relatively unremarkable.  Hemoglobin postop day #1 was 13.4.  Urine output remained pretty good  with minimal JP drainage.  The patient and his wife did not feel  comfortable with him going home on postoperative day #1.  He was kept an  additional night to check.  He had a low  grade fever on postoperative  day #1.  On postop day #2 he had a small amount of gas pain, but had  also passed flatus.  He was taking liquids in relatively well.  He  remained afebrile with good urinary output and minimal JP drainage.  His  Jackson-Pratt drain was removed.  He underwent leg bag teaching and  routine postoperative instructions.  He was able to be then discharged  on that day.   DISPOSITION:  The patient was discharged to home.  He was given a  prescription for pain medicines to take as needed and to continue on  regular medications.  He is to go ahead and start a general diet.  We  talked about activity limitations and he will follow up in our office in  approximately 10 days.           ______________________________  Valetta Fuller, M.D.  Electronically Signed     DSG/MEDQ  D:  06/15/2006  T:  06/15/2006  Job:  [id number]

## 2010-05-28 NOTE — H&P (Signed)
[name], DENZ NO.:  [id number]   MEDICAL RECORD NO.:  [id number]          PATIENT TYPE:  INP   LOCATION:  1846                         FACILITY:  [facility]   PHYSICIAN:  Colleen Can. Deborah Chalk, M.D.DATE OF BIRTH:  05/08/1934   DATE OF ADMISSION:  07/29/2006  DATE OF DISCHARGE:                              HISTORY & PHYSICAL   CHIEF COMPLAINT:  Chest pain.   HISTORY OF PRESENT ILLNESS:  [name] is a very pleasant 76 year old  white male who has known ischemic heart disease. He had a previous  subendocardial MI a little over two years ago and subsequently underwent  coronary artery bypass grafting. He has had a long-standing history of  gastroesophageal reflux disease as well as chronic esophagitis. He has  also recently had prostate surgery towards the latter part of April. He  presents to our office with a two-day history of left-sided chest pain.  His worse episode occurred late this morning at around 11:00. It has  been radiating to the left arm. He has had tingling down to the fingers.  He has not been short of breath. He has had no nausea. His cardiac  symptoms are basically identical to his GI symptoms. He became very  scared and nervous. He presented to our office where he continued to  have just a vague ache in the left upper chest. It was worse with  palpation on the chest wall. EKG showed Q waves in leads III and aVF.  The Q waves in aVF are new. He had no acute changes. In light of the  ongoing symptomatology and difficult nature to sort out, he is referred  for admission with plans for cardiac catheterization.   His past medical history:  1. Known atherosclerotic cardiovascular disease. He had a      subendocardial MI in April of 2006 and subsequently underwent      coronary artery bypass grafting x4 with left internal mammary to      the LAD, saphenous vein graft to the diagonal, saphenous vein graft      to the distal circumflex, and saphenous  vein graft to the first      obtuse marginal per Dr. Kathlee Nations Trigt. His last Cardiolite test      was in April of 2007 which showed a very small mid inferolateral      defect. His ejection fraction was 61%. There was no evidence of      ischemia.  2. Chronic esophagitis with gastroesophageal reflux disease.  3. Hypertension.  4. Hyperlipidemia.  5. Previous cholecystectomy.  6. Recent prostatectomy April 2008 with subsequent residual      incontinence.  7. Previous rotator cuff injury.  8. Previous surgery for tennis elbow.   ALLERGIES:  None.   CURRENT MEDICATIONS:  1. Zocor 40 mg a day.  2. Xalatan eye drops daily.  3. Altace 10 mg a day.  4. Toprol-XL 50 mg a day.  5. Centrum A-Z vitamin daily.  6. Baby aspirin daily.  7. Folic acid daily.  8. Lorazepam p.r.n.  9. Zegerid 40 mg a day.  10.Carafate at bedtime which was recently started just over the last 1      to 2 weeks.   His family history:  Father died at 32 with Parkinson's, mother died in  her 64s with a history of a staph infection, one sister has had bypass  surgery at age 52, and another sister has had 2 coronary stents placed.   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 He is married. His wife is a Customer service manager. He has 2 sons in  good health. He stopped smoking over 30 years ago. He does drink 2 to 3  glasses of wine per day.   REVIEW OF SYSTEMS:  He has had continued to have right shoulder  discomfort. He notes that he had this after bypass surgery and actually  had to see orthopedics as well as had to have physical therapy. He does  feel like the shoulder injury may have been re-aggravated at the time of  his prostate surgery. He has continued to have problems with chronic  esophagitis and reflux which are exacerbated with coffee intake and  alcohol intake. He has tried to exercise but really not on a regular  basis. He has not used nitroglycerin. He has had no  shortness of breath.  He does appear to have had exertional symptoms. He does describe a  sensation of paralysis of both shoulders that occurs while driving his  vehicle. This has been somewhat chronic but has also been worse over the  past week. He is not exercising now secondary to incontinence. He has  also had some associated belching. This tends to relieve his discomfort,  yet for it only to return. He has had some tenderness over the chest  wall as well. He notes that his symptoms of chest pain are identical  with his esophagitis, gallbladder disease, as well as heart disease.   On exam, he is a very pleasant white male. He is no acute distress. He  is somewhat hard of hearing. His weight is 199 pounds. Blood pressure is  130/78, heart rate 60 and regular, respirations 18. He is afebrile.  SKIN:  Is warm and dry. His color is suntanned.  His lungs are clear.  HEART:  Shows a regular rhythm. He does have some chest wall tenderness  over the left upper chest. Cardiac exam shows a regular rhythm. There is  no murmur or rub.  ABDOMEN:  Is soft. He has operative scars noted.  EXTREMITIES:  Are without edema.  NEUROLOGICAL:  Shows no gross focal deficits.   PERTINENT LABORATORY DATA:  Are pending.   OVERALL IMPRESSION:  1. Atypical chest pain.  2. Known ischemic heart disease with previous subendocardial      myocardial infarction and subsequent coronary artery bypass      grafting.  3. Chronic esophagitis and reflux.  4. Recent robotic surgery/prostatectomy for prostate cancer.  5. Hypertension.  6. Hyperlipidemia.   PLAN:  [name] symptoms are certainly difficult to sort out. It is  felt that it is best to proceed with admission to the hospital. He will  be placed on IV nitroglycerin and IV heparin. Will proceed on with  cardiac catheterization per Dr. Swaziland tomorrow afternoon. If his bypass  grafts are patent, then we can proceed on with other evaluation.  The patient  has been maintained on protein-pump inhibitor as well as the  addition of Carafate, and we will continue those during this  admission.  Cardiac enzymes will be drawn in a serial fashion. Further treatment  plan to follow per Dr. Elvis Coil discretion.      Sharlee Blew, N.P.      Colleen Can. Deborah Chalk, M.D.  Electronically Signed    LC/MEDQ  D:  07/29/2006  T:  07/29/2006  Job:  [id number]   cc:   Peter M. Swaziland, M.D.  Colleen Can. Deborah Chalk, M.D.

## 2010-05-28 NOTE — Cardiovascular Report (Signed)
NAME:  [name], [name] NO.:  [id number]   MEDICAL RECORD NO.:  [id number]          PATIENT TYPE:  INP   LOCATION:  3743                         FACILITY:  [facility]   PHYSICIAN:  Peter M. Swaziland, M.D.  DATE OF BIRTH:  12-Dec-1934   DATE OF PROCEDURE:  07/30/2006  DATE OF DISCHARGE:                            CARDIAC CATHETERIZATION   INDICATIONS FOR PROCEDURE:  The patient is a 76 year old male who is  status post coronary artery  bypass surgery in April 2006.  He now  presents with refractory left shoulder discomfort.  The patient does  have a history of hypercholesterolemia and hypertension.   PROCEDURE:  Left heart catheterization, coronary and left ventricular  angiography, saphenous vein graft angiography x3, LIMA graft  angiography.   EQUIPMENT USED:  6-French 4-cm right and left Judkins catheter, 6-French  LIMA catheter, 6-French left Amplatz 1 catheter, 6-French LCB catheter,  6-French pigtail catheter, 6-French arterial sheath.   MEDICATIONS:  Local anesthesia with 1% Xylocaine.   CONTRAST:  185 mL of Omnipaque.   HEMODYNAMIC DATA:  Aortic pressure is 130/75 with a mean of 98 mmHg,  left ventricle pressure is 125 with EDP of 27 mmHg.   ANGIOGRAPHIC DATA:  The left coronary artery arises normally.  It  distributes in a dominant fashion.  The left main coronary has mild  irregularities less than 10%.   The left anterior descending artery is occluded after the takeoff of the  first diagonal branch.  The first diagonal branch has approximately 50%  stenosis at its origin.  It then tapers significantly in the mid vessel  and is occluded in the mid to distal vessel with faint left-to-left  collaterals to the distal diagonal.   The left circumflex coronary artery is a dominant vessel. It gives rise  to four marginal vessels and then terminates in a posterior descending  artery.  The first marginal vessel is a large branch which has segmental  80-90%  stenosis proximally. The second marginal vessel is without  significant disease.  The mid left circumflex coronary has a 70%  stenosis at the takeoff of a small third obtuse marginal vessel.  The  fourth obtuse marginal vessel has a 99% stenosis in the mid vessel at  the insertion site of the vein graft.   The terminal circumflex prior to the PDA has diffuse 60-70% disease.   The right coronary artery is a nondominant vessel.  It is occluded  proximally and has bridging collaterals to the remainder of the vessel.   The LIMA graft to the LAD is a large graft that is widely patent.  It  inserts in the mid-LAD and appears to have good runoff.  It is very  difficult to directly engage this but we were able to get good flush  shots of this vessel.   The saphenous vein graft to the first obtuse marginal vessel is widely  patent with good runoff.   The saphenous vein graft to the first diagonal branch is occluded.   The saphenous vein graft to the fourth obtuse marginal vessel is  occluded as well.  Left ventricular angiography was performed in the RAO view and this  demonstrates normal left ventricular size, contractility with normal  systolic function.  Ejection fraction is estimated at 60% with no  significant mitral insufficiency.   FINAL INTERPRETATION:  1. Severe three-vessel obstructive atherosclerotic coronary artery      disease.  2. Patent LIMA graft to LAD.  3. Patent saphenous vein graft to the first obtuse marginal vessel.  4. Occluded saphenous vein graft to the diagonal.  5. Occluded saphenous vein graft to the fourth obtuse marginal vessel.  6. Normal left ventricular function.   PLAN:  At this point would recommend aggressive medical therapy. If the  patient has persistent anginal symptoms could consider percutaneous  intervention of the mid circumflex and possibly the fourth obtuse  marginal vessel although this is fairly distal and a small branch.            ______________________________  Peter M. Swaziland, M.D.     PMJ/MEDQ  D:  07/30/2006  T:  07/31/2006  Job:  [id number]   cc:   Kerin Perna, M.D.

## 2010-05-28 NOTE — Discharge Summary (Signed)
NAME:  [name], [name] NO.:  [id number]   MEDICAL RECORD NO.:  [id number]          PATIENT TYPE:  INP   LOCATION:  3743                         FACILITY:  [facility]   PHYSICIAN:  Peter M. Swaziland, M.D.  DATE OF BIRTH:  [birth year]   DATE OF ADMISSION:  07/29/2006  DATE OF DISCHARGE:  07/31/2006                               DISCHARGE SUMMARY   HISTORY OF PRESENT ILLNESS:  [name] is a 76 year old white male who  has a known history of coronary disease.  He had coronary bypass surgery  in April 2006.  He presented, at this time, with complaints of left  upper chest pain radiating to his left shoulder and tingling in his left  arm.  These symptoms were refractory, and somewhat difficult to  evaluate.  He had no shortness of breath, nausea, or vomiting.  Due to  his refractory symptoms, it is recommended that he be admitted for  further evaluation with cardiac catheterization.  The patient does have  a history of hypercholesterolemia and hypertension.  He has a history of  prior prostate surgery.   For details of his past medical history, social history, family history  and physical exam -- please see admission history and physical.   LABORATORY DATA:  His ECG shows Q waves in leads III and aVF; otherwise  no acute changes.  His chest x-ray showed cardiomegaly without acute  change.  White count 6100, hemoglobin 15.4 hematocrit 45.4, platelets  were 195,000.  TSH was 0.606.  Coags were normal.  Sodium 136, potassium  3.8, chloride 103, CO2 25, glucose of 115, BUN 11, creatinine 0.98.  Serial cardiac enzymes were normal.  Liver function studies were normal.  Cholesterol is 138, triglycerides 176, HDL 46, LDL of 57.  This is a  nonfasting sample.   HOSPITAL COURSE:  The patient was admitted to telemetry; and was placed  on IV nitroglycerin and heparin.  As noted he ruled out for myocardial  infarction.  On July 30, 2006 he underwent cardiac catheterization.  This  demonstrated occlusion of the LAD proximally.  The first diagonal  was also occluded in the mid-to-distal vessel.  The left circumflex  coronary had an 80-90% stenosis of the proximal first marginal vessel.  There was a 70% stenosis at the mid circumflex.  The fourth obtuse  marginal vessel had a 95% stenosis in the mid vessel at the insertion of  the vein graft.  There was diffuse 60-70% stenosis in the PDA; this was  a dominant circumflex.  The right coronary was a nondominant vessel; and  was occluded proximally, and had bridging collaterals to the rest of the  right coronary artery.  His grafts included LIMA graft to LAD which was  patent; a vein graft to the first marginal vessel which was patent;  saphenous vein graft to the first diagonal was occluded.  The saphenous  vein graft to the fourth obtuse marginal vessel was occluded.  Left  ventricular function was normal with an ejection fraction 60%.   Based on these findings, it was felt that the patient would be best  treated medically.  The fourth marginal vessel was small in caliber; and  had a distal location.  The first diagonal was occluded.  The only area  that could be intervened on would be the mid circumflex, but this was  only a moderate stenosis.  It was unclear that this was the source of  his discomfort.  It was fairly clear through his hospital stay that his  left shoulder pain was more of a musculoskeletal pain.  This was  reproduced with any repositioning of his shoulder.  The patient had had  similar pain after bypass surgery on the right; and it took fairly  extensive physical therapy and chiropractor care to relieve the pain.  He dates his current pain to when he had his prostate surgery; and it  may be that positioning during his surgery aggravated underlying  condition.   The patient is intolerant of nonsteroidal anti-inflammatory drugs.  It  is recommended that he take Tylenol for pain; and he is going to   followup with his chiropractor concerning treatment of his left shoulder  pain.  If this was unsuccessful, he may need referral to orthopedics.  As far as his coronary disease, we did intensify his therapy.  We added  a long-acting nitrate with isosorbide 60 mg per day started him on  Plavix 75 mg per day.  The patient had also had been experiencing  symptoms of cough and strangling on Altace; and so we switched this to  an angiotensin receptor blocker, Diovan 160 mg per day.  The patient was  discharged home in stable condition on July 31, 2006.   DISCHARGE DIAGNOSIS:  1. Left shoulder pain predominantly musculoskeletal.  2. Coronary disease status post coronary artery bypass graft with      occlusion of two of his vein grafts to small diffusely diseased      vessels.  3. Hypercholesterolemia.  4. Hypertension.  5. Gastroesophageal reflux disease, chronic  6. History of prostate surgery.   DISCHARGE MEDICATIONS:  1. Zocor 40 mg daily.  2. Zegerid 40 mg daily.  3. Carafate 1 gram daily.  4. Metoprolol tartrate 50 mg daily.  5. Xalatan eye drops as prescribed.  6. Folic acid 1 mg daily.  7. Altace was DISCONTINUED.  8. Isosorbide mononitrate 60 mg per day.  9. Plavix 75 mg per day.  10.Diovan 160 mg per day.   The patient will follow up with Dr. Swaziland in 2-3 weeks.   DISCHARGE STATUS:  Improved.           ______________________________  Peter M. Swaziland, M.D.     PMJ/MEDQ  D:  07/31/2006  T:  07/31/2006  Job:  [id number]

## 2010-05-31 NOTE — Assessment & Plan Note (Signed)
[facility]                           GASTROENTEROLOGY OFFICE NOTE   RAPHEAL, MASSO                     MRN:          [id number]  DATE:09/10/2005                            DOB:          [birth year]    I went over the _____________and the results of his colonoscopy.  I told him  that I did not think that the results were consistent with his shoulder pain  and that he probably should seek a consultation with an orthopedic surgeon  involved with, predominately, the shoulder.  He does see the physicians at  Dr. Pearletha Furl office.  I told him if he had problems with this, I would be  more than happy to help him out.  He says he is basically asymptomatic now.  He taking Lopressor, baby aspirin, folic acid, Altace, metoprolol,  sucralfate, __________ .  I did go over the results of the biopsies and  which are basically negative.  I told him that this was all good.  I told  him he needed to continue on a proton pump inhibitor; I gave him some  samples and wrote him out a prescription for his medication and told him to  call me if he has any further difficulty.                                   Ulyess Mort, MD   SML/MedQ  DD:  09/10/2005  DT:  09/11/2005  Job #:  [id number]

## 2010-05-31 NOTE — Op Note (Signed)
NAME:  [name], [name]              ACCOUNT NO.:  [id number]   MEDICAL RECORD NO.:  [id number]          PATIENT TYPE:  INP   LOCATION:  2301                         FACILITY:  [facility]   PHYSICIAN:  Kathlee Nations Trigt III, M.D.DATE OF BIRTH:  Mar 22, 1934   DATE OF PROCEDURE:  04/25/2004  DATE OF DISCHARGE:                                 OPERATIVE REPORT   OPERATION:  Coronary artery bypass grafting x 4 (left internal mammary  artery to the left anterior descending, saphenous vein graft to diagonal,  saphenous vein graft to first obtuse marginal, saphenous vein graft to  distal circumflex).   PREOPERATIVE DIAGNOSIS:  Class IV unstable angina with severe three vessel  coronary artery disease.   POSTOPERATIVE DIAGNOSIS:  Class IV unstable angina with severe three vessel  coronary artery disease.   SURGEON:  Kerin Perna, M.D.   ASSISTANT:  Coral Ceo, P.A.-C.   ANESTHESIA:  General.   INDICATIONS FOR PROCEDURE:  The patient is a 76 year old male who presented  with unstable angina and minimally elevated enzymes.  Cardiac  catheterization demonstrated severe three vessel coronary artery disease  with preserved LV function.  He was not felt to be a candidate for  percutaneous intervention and a surgical evaluation was requested.  Prior to  surgery, I examined the patient in his CCU room and reviewed the results of  the cardiac catheterization with the patient and family.  I discussed the  indications and expected benefits of coronary bypass surgery for treatment  of his coronary artery disease.  I discussed the major indications,  benefits, and risks with the patient.  We discussed the major details of the  operation including the choice of conduit for grafts, the location of the  surgical incisions, the use of general anesthesia and cardiopulmonary  bypass, and the expected postoperative hospital recovery.  I discussed with  the patient the risks to him of coronary bypass surgery  including the risks  of MI, CVA, bleeding, infection, and death.  He understood the implications  for the surgery and agreed to proceed with the operation as planned under  what I felt was an informed consent.   OPERATIVE FINDINGS:  The patient's coronaries were severely and diffusely  diseased.  The distal circumflex, the dominant coronary vessel, had a small  posterior descending which was too small to graft.  The OM1 was  intramyocardial.  The diagonal was a heavily calcified vessel and a  suboptimal target.  Vein was harvested from the leg endoscopically and was a  good conduit.  The mammary artery was a good vessel with excellent flow.   PROCEDURE:  The patient was brought to the operating room and placed supine  on the operating table.  General anesthesia was induced under invasive  hemodynamic monitoring.  The chest, abdomen, and legs were prepped with  Betadine and draped as a sterile field.  A sternal incision was made as the  saphenous vein was harvested endoscopically from the right leg.  The left  internal mammary artery was harvested as a pedicle graft from its origin of  the subclavian vessels.  It was a good vessel with excellent flow.  Heparin  was administered and the ACT was documented as being therapeutic.  The  sternal retractor was placed using the deep blades due to the patient's  obese body habitus.  The pericardium was opened and suspended.  Purse-  strings were placed in the ascending aorta and right atrium and the patient  was cannulated and placed on bypass.  The coronaries were identified for  grafting and the mammary artery and vein grafts were prepared for the distal  anastomoses.  Cardioplegia catheters were placed for both antegrade aortic  and retrograde coronary sinus cardioplegia.  The patient was cooled to 30  degrees.  The aortic Cross clamp was applied and 800 mL of cold blood  cardioplegia was delivered in split doses between the antegrade aortic  and  retrograde coronary sinus catheters.  There was good cardioplegic arrest and  septal temperature dropped to less than 15 degrees.  Topical iced saline  slush was used to augment myocardial preservation and a pericardial  insufflator pad was used to protect the left phrenic nerve.   The distal coronary anastomoses were then performed.  The first distal  anastomosis was to the diagonal branch of the LAD.  It was 1.4 mm vessel  with a heavily calcified proximal 90% stenosis.  A reversed saphenous vein  was sewn end-to-side with running 7-0 Prolene and there was good flow  through the graft.  The second distal anastomosis was to the OM1.  This was  a heavily diffusely diseased 1.7 vessel with proximal 80% stenosis.  A  reversed saphenous vein graft was sewn end-to-side with running 7-0 Prolene  with good flow through the graft.  Cardioplegia was redosed.  The third  distal anastomosis was to the distal circumflex posterolateral branch.  This  was a 1.5 mm vessel with a proximal 80% stenosis.  It was also heavily and  diffusely diseased.  A reversed saphenous vein was sewn end-to-side with  running 7-0 Prolene with good flow through the graft.  The cardioplegia was  redosed.  The fourth distal anastomosis was placed to the mid LAD which was  deep in epicardial fat.  The left IMA pedicle was brought through an opening  created in the left lateral pericardium and was brought down to the LAD and  sewn end-to-side with running 8-0 Prolene.  There was excellent flow through  the anastomosis after briefly releasing the bulldog on the mammary pedicle.  The bulldog was replaced and the pedicle was secured to the epicardium with  6-0 Prolene.  Cardioplegia was redosed.   While the Cross clamp was still in place, three proximal vein anastomoses  were placed on the ascending aorta using a 4.0 mm punch and running 6-0 Prolene.  Prior to tying down the last proximal anastomosis, air was vented  from  the coronaries and left side of the heart using a dose of retrograde  warm blood cardioplegia and the usual deairing maneuvers on bypass.  The  Cross clamp was then removed.  The heart resumed a spontaneous rhythm.  Air  was aspirated from the vein grafts using a 27 gauge needle.  The vein grafts  were then perfused and each had good flow.  Hemostasis was documented at the  proximal and distal anastomoses.  The patient was rewarmed and reperfused.  Temporary pacing wires were applied.  The lungs were expanded and the  ventilator resumed.  When the patient was rewarmed and reperfused, he was  weaned from bypass on low dose Dopamine with stable blood pressure and good  cardiac output.  Protamine was administered without adverse reaction.  The  mediastinum was irrigated with warm antibiotic irrigation.  The leg incision  was irrigated and closed in a standard fashion.  The pericardium was loosely  reapproximated superiorly.  Two mediastinal and left pleural chest tubes  were placed and brought out through separate incisions.  The sternum was  closed with interrupted steel wire.  The pectoralis fascia was closed with a  running #1 Vicryl.  While we were closing the subcutaneous layer, the  patient developed a change in his EKG morphology which was consistent with  either a left bundle branch block or ST elevation from ischemia.  He  remained hemodynamically stable.  We elected to reopen the chest.  All the  sternal wires were removed.  The sternal retractor was replaced.  He  remained hemodynamically stable.  The vein grafts were inspected and found  to be patent.  No visible air was seen in the vein grafts.  They were  aspirated again with a 27 gauge needle.  There was no apparent air  aspirated.  A 27 gauge needle was also used to aspirate the ascending aorta.  Again, no apparent air was aspirated.  The patient's EKG morphology returned  back to normal.  The anesthesiologist placed a  transesophageal 2D echo  probe.,  The LV function was normal and there was no regional wall  dysfunction, there was no sign of intramyocardial or intraventricular air.  There were no valvular abnormalities.  The patient was observed carefully  and continued to be stable with a more normal EKG morphology.  The sternum  was then reclosed with interrupted  steel wire.  The pectoralis fascia was reclosed with a running #1 Vicryl.  The subcutaneous tissue and skin layers were closed with running Vicryl and  sterile dressings were applied.  Total bypass time was 140 minutes.  The  Cross clamp time was 84 minutes.      PV/MEDQ  D:  04/25/2004  T:  04/25/2004  Job:  [id number]   cc:   Arturo Morton. Riley Kill, M.D. [facility]

## 2010-05-31 NOTE — Op Note (Signed)
NAMEDAMON, HARGROVE NO.:  [id number]   MEDICAL RECORD NO.:  [id number]          PATIENT TYPE:  INP   LOCATION:  1424                         FACILITY:  [facility]   PHYSICIAN:  Valetta Fuller, M.D.  DATE OF BIRTH:  1934-05-15   DATE OF PROCEDURE:  05/11/2006  DATE OF DISCHARGE:                               OPERATIVE REPORT   PREOPERATIVE DIAGNOSIS:  Clinical stage T1C adenocarcinoma prostate.   POSTOPERATIVE DIAGNOSIS:  Clinical stage T1C adenocarcinoma prostate.   PROCEDURE PERFORMED:  A robotic assisted laparoscopic radical retropubic  prostatectomy with bilateral pelvic lymph node dissection.   SURGEON:  Valetta Fuller, M.D.   ASSISTANT:  Crecencio Mc, M.D.   ANESTHESIA:  General endotracheal.   COMPLICATIONS:  None.   SPECIMEN:  Prostate with bilateral pelvic lymph nodes.   INDICATIONS:  Mr. Cronkright is a 76 year old male.  He was sent to me  through the courtesy of Dr. Patsi Sears for possible surgical management  of a recently diagnosed clinical stage T1C adenocarcinoma prostate.  The  patient has had a mildly elevated PSA of approximately 5. Ultrasound  revealed a 40 grams prostate.  The patient ended up having relatively  high-volume cancer in the right lobe of his prostate.  He had primary  Gleason 4 component with the lesser component Gleason 3 for a total  score of 7.  On the left side there were smaller volume Gleason 3 + 3 =  6 disease.  The abdominal pelvic CT as well as bone scan failed to  reveal any obvious clinical metastatic disease.  The patient has had  ongoing problems with chronic prostatitis and prostadynia.  He has some  fairly significant AUA symptom score the mid 20 range with some  improvement with alpha blocker therapy.  He has severe significant  erectile dysfunction.  The patient underwent extensive counseling by Dr.  Patsi Sears as well as Dr. Chipper Herb in radiation oncology.  I also  discussed multiple options with him.   The patient does have a history of  coronary artery disease but has had good cardiac report recently and was  given clearance as a low risk cardiac patient.  We recommended  unilateral nerve spare but felt that he would have ongoing significant  erectile dysfunction.  The patient appeared to understand the advantages  and disadvantages of a surgical approach for managing this problem.  He  elected to proceed with a robotic prostatectomy.   TECHNIQUE AND FINDINGS:  The patient was brought to the operating room.  He had received preoperative Unasyn and also had compression boots  placed.  He had successful induction of general endotracheal anesthesia  and was placed in the mid lithotomy position.  He was secured carefully  the table then placed in Trendelenburg position.  All extremities were  carefully padded.  He was then prepped and draped in the usual manner.  Foley catheter was inserted sterilely on the field.  Initial camera port  incision was chosen 18 cm above the pubic symphysis just to the left of  the umbilicus.  A standard open Hasson technique was used  and a 12-mm  cannula was placed.  The abdomen was then insufflated without incident  and carefully inspected.  No adhesions were noted.  Additional ports  were all placed under direct visual guidance.  This included three 8-mm  robotic ports, a 12-mm assist port and 5-mm assist port.  Once all ports  were in position, the robot was then docked.  The bladder was filled and  the space of Retzius was entered by going superior to the bladder  utilizing a cautery scissors.  Once the prostate bladder was exposed,  the prostate was defatted.  The endopelvic fascia was incised laterally  on both sides from the apex of the base of the prostate.  Levator  musculature was swept away off the apex of the prostate, allowing [place] to  isolate the dorsal vein complex.  Puboprostatic ligaments were taken  down.  An ETS stapling device was used to  staple across the dorsal vein.   Attention was then turned towards the anterior bladder neck.  This was  identified with the aid of the balloon in the catheter.  The anterior  bladder neck was divided.  The underlying catheter was removed and  retracted anteriorly.  There was no evidence of a middle lobe.  Indigo  carmine was utilized to help identify the ureteral orifices.  The  posterior bladder neck was then divided.  The seminal vesicles and vas  were identified in the midline.  These structures were individually  dissected free.  We then were able to establish the posterior plane  between the rectum and the prostate.  Attention was then turned towards  nerve spare on the low left side.  The neurovascular bundle was  dissected off the lateral posterior aspect of the prostate and then  freed apically towards the base.  Once that plane was established, we  were able to lift the prostate and identify the pedicle to the prostate.  This was taken down with surgical clips of Hem-o-lok variety.  On the  right side we went wide and completely left the neurovascular bundle  attached to the lateral surface of the prostate.  A small amount of  remaining dorsal vein was then incised.  The underlying urethra was then  transected completely leave a nice urethral stump and the specimen was  brought out of the pelvis and placed in the abdomen.   Attention was then turned towards bilateral pelvic lymph node  dissection.  Standard obturator packets were removed bilaterally  identifying the obturator nerve on both sides and preserving the  structure.  Clips were used for the lymph channels and small vessels.  The bladder did not require any type of reconstruction.  Reanastomosis  was done initially with a 2-0 Vicryl suture in the 6 o'clock position on  the urethra and bladder neck to reapproximate those structures.  We then used a double-armed 3-0 Monocryl suture to perform a 360 degrees running   anastomosis.  A new coude catheter was then placed through the  anastomosis without difficulty and irrigation revealed this to be  watertight.  A Blake drain was placed in the pelvis through one of the  robotic ports.  Estimated blood loss was relatively minimal.  The  specimen was retrieved through an Endopouch.  The 12-mm assist port was  closed with the help of a suture passer with some 2-0 Vicryl suture.  The camera port incision was extended slightly for removal of the  specimen.  The fascia was then closed  with a running zero Vicryl suture.  All incisions were then closed with staples.  The patient was brought to  recovery room in stable condition.  He had no obvious complications or  problems.           ______________________________  Valetta Fuller, M.D.  Electronically Signed     DSG/MEDQ  D:  05/12/2006  T:  05/13/2006  Job:  [id number]   cc:   Vonzell Schlatter. Patsi Sears, M.D.  Fax: [phone]   [facility]

## 2010-05-31 NOTE — Cardiovascular Report (Signed)
[name], [name] NO.:  [id number]   MEDICAL RECORD NO.:  [id number]          PATIENT TYPE:  INP   LOCATION:  2926                         FACILITY:  [facility]   PHYSICIAN:  Arturo Morton. Riley Kill, M.D. [facility] OF BIRTH:  Nov 29, 1934   DATE OF PROCEDURE:  04/23/2004  DATE OF DISCHARGE:                              CARDIAC CATHETERIZATION   INDICATIONS:  [name] is a 76 year old gentleman who presents with a non-  ST-elevation MI.  He has continued to have some stuttering chest pain, there  are not definitive electrocardiographic abnormalities.  He was brought to  the catheterization laboratory after evaluation by Dr. Daleen Nash this morning.   PROCEDURE:  1.  Left heart catheterization  2.  Selective coronary arteriography.  3.  Selective left ventriculography.  4.  Subclavian angiography.   DESCRIPTION OF PROCEDURE:  The procedure was performed through the right  femoral artery using 6-French catheters.  He tolerated the procedure without  complications and he was taken to the holding area in satisfactory clinical  condition.  I reviewed the films with the patient's spouse, and discussed  the patient's case and we called a surgical consult.   HEMODYNAMIC DATA:  1.  Central aortic pressure 126/84, mean 104.  2.  Left ventricular pressure 122/50.  3.  No gradient or pullback across the aortic valve.   ANGIOGRAPHIC DATA:  1.  On plain fluoroscopy there was fairly moderate calcification of the      coronary arteries, particularly in the LAD system.  2.  The left main coronary is a large-caliber vessel that is free of      critical disease.  3.  The LAD courses to the apex.  The LAD is severely diseased.  There is      probably a 50% eccentric stenosis at the ostium, there is then 95%      narrowing in the LAD proper at the takeoff of the major diagonal.      Distal to this is diffuse 70 and 90% narrowing in the LAD.  The distal      vessel has mild luminal  irregularity but is a vessel that appears to be      suitable for grafting.  There is probably 50% disease of the distal      apical tip.  The diagonal itself is a fairly large-caliber vessel that      itself is diffusely diseased.  There are tandem lesions that involve 90%      at the ostium, 50-70% at the mid vessel.  Some branches demonstrate 50-      70% narrowing prior to a second bifurcation, the bifurcation also has      80% disease.  As noted, these vessels are severely diseased and exact      grading is difficult because of the diffuseness of the disease.  4.  The circumflex is a very large-caliber vessel.  There is an initial      small marginal branch that is small in caliber and free of focal      disease.  There is then a large-caliber  marginal branch that has about      90% narrowing and then opens up into what looks like a 3-4 mm vessel.      Following this, there is an additional marginal branch that has 50%      narrowing at the ostium and 50% narrowing in the mid.  The AV circumflex      has 70% hazy narrowing, then distally prior to a takeoff of a small      marginal.  The next marginal has 70% narrowing at its ostium and then a      90% mid stenosis and what looks like a large- caliber vessel.  The PDA      is fairly small in caliber and diffusely diseased throughout its      proximal portion.  5.  The right coronary artery is totally occluded and probably is a small      vessel given the size of the circumflex.  6.  The subclavian appears to be widely patent.  There is some mild      narrowing at the ostium of the vertebral which looks like a 50% area of      plaquing.   Ventriculography in the RAO projection reveals well-preserved global  systolic function.   CONCLUSIONS:  1.  Well-preserved left ventricular function.  2.  Critical three-vessel coronary artery disease.   DISPOSITION:  The anatomy is not suitable for percutaneous intervention.  He  has severe  three-vessel disease, ongoing ischemic symptoms, and preserved  overall LV function.  Revascularization surgical consultation has been  obtained with [facility].  My understanding is that Dr. Donata Nash will be seeing  the patient.      TDS/MEDQ  D:  04/23/2004  T:  04/23/2004  Job:  [id number]   cc:   [name], M.D. [facility]   [name], M.D. [facility]   CV Laboratory

## 2010-05-31 NOTE — Consult Note (Signed)
NAMEKIP, CROPP NO.:  [id number]   MEDICAL RECORD NO.:  [id number]          PATIENT TYPE:  INP   LOCATION:  2926                         FACILITY:  [facility]   PHYSICIAN:  Kathlee Nations Trigt III, M.D.DATE OF BIRTH:  1934/11/02   DATE OF CONSULTATION:  04/23/2004  DATE OF DISCHARGE:                                   CONSULTATION   PRIMARY CARDIOLOGIST:  Olga Millers, M.D. [facility]   REASON FOR CONSULTATION:  Severe three-vessel coronary artery disease with  subendocardial myocardial infarction and unstable angina.   CHIEF COMPLAINT:  Chest pain.   HISTORY OF PRESENT ILLNESS:  I was asked to evaluate this 76 year old white  male for potential surgical coronary revascularization for recently  diagnosed severe three-vessel coronary artery disease. The patient developed  exertional chest pain which progressed to nocturnal angina. He presented to  the emergency room on the evening of April 10th. At that time he had had  stuttering chest pain for 36 hours. It was associated with some diaphoresis,  nausea, and shortness of breath. It improved with rest. The patient's EKG  showed a sinus rhythm with evidence of a previous inferior MI. There is  slight ST-segment elevation in the inferolateral leads. His cardiac enzymes  are positive with his CPK-MB arising to 40 ng/mL. He is admitted, placed on  heparin, Integrilin, and nitroglycerin, and underwent cardiac  catheterization. He was also given Plavix 75 mg yesterday and today. Cardiac  catheterization was completed by Dr. Bonnee Quin. This showed severe three-  vessel coronary artery disease with a dominant left coronary circulation.  There is a proximal 95% stenosis of the LAD and a 95% stenosis of a  diagonal. The OM-1 had a 90% stenosis and the distal circumflex had an 80%  stenosis. Ejection fraction was 60% and left ventricular end-diastolic  pressure was 15 mmHg. There was no evidence of mitral regurgitation or  aortic  stenosis. The patient was felt not to be a candidate for PCI and a  surgical evaluation was requested.   PAST MEDICAL HISTORY:  1.  GERD, esophagitis.  2.  Hypertension.  3.  Hyperlipidemia.   HOME MEDICATIONS:  Nexium, Norvasc, folate, multivitamin.   ALLERGIES:  None.   SOCIAL HISTORY:  The patient smokes an occasional cigar. He drinks wine  usually every night. He is still employed as a Surveyor, minerals.   FAMILY HISTORY:  Positive for coronary artery disease in his sister and  father.   PAST SURGICAL HISTORY:  Left tennis elbow surgery.   REVIEW OF SYSTEMS:  CONSTITUTIONAL: Negative for fever or weight loss. ENT:  Negative for difficulty swallowing or active dental problems. THORACIC:  Negative for chest trauma, abnormal chest x-ray, or productive cough.  CARDIAC: Positive for unstable angina or acute MI. Negative for history of  cardiac murmur. GI: Positive for GERD. Negative for jaundice, hepatitis, or  blood per rectum. ENDOCRINE: Negative for diabetes or thyroid disease,  RENAL: Positive for kidney stones. Negative for BPH or prostate cancer.  VASCULAR: Negative for DVT, claudication, or TIA. HEMATOLOGIC: Negative for  bleeding disorder or prior blood transfusion. NEUROLOGIC:  Negative for  stroke or seizure.   PHYSICAL EXAMINATION:  VITAL SIGNS: The patient is 5 feet 11 inches, weight  204 pounds, blood pressure 148/70, pulse 80 and regular sinus, respirations  18, saturation 95% on room air.  GENERAL APPEARANCE:  A pleasant middle-age white male, slightly overweight,  in the CCU, following cardiac catheterization, in no acute distress.  HEENT: Normocephalic. Dentition good.  NECK: Without JVD, mass, or carotid bruits.  LYMPHATICS: No palpable supraclavicular or axillary adenopathy.  LUNGS: Clear without thoracic deformity.  CARDIAC: Regular rhythm without S3, gallop, murmur, or rub.  ABDOMEN: Soft, nontender without organomegaly or pulsatile mass.  EXTREMITIES: No  clubbing, cyanosis, or edema.  VASCULAR EXAM:  2+ pulses in all extremities without venous insufficiency of  the lower extremities.  NEUROLOGIC: Alert and oriented without focal motor deficits.   LABORATORY DATA:  Creatinine is 1.3, hematocrit 49%.   Chest x-ray reveals no acute disease. His coronary arteriograms are reviewed  and I am in agreement with Dr. Riley Kill and his impression is severe coronary  artery disease not amenable to percutaneous intervention with fairly well  preserved LV systolic function.   IMPRESSION/PLAN:  The patient has severe coronary artery disease and would  be best served with surgical revascularization. His surgery will be  scheduled for the morning of April 13th. I discussed the procedure including  indications, benefits, risks, and expected recovery. He is in agreement to  proceed. He will obtain his pre-CABG Doppler exams in the morning.      PV/MEDQ  D:  04/23/2004  T:  04/23/2004  Job:  [id number]

## 2010-05-31 NOTE — H&P (Signed)
NAME:  TOR, TSUDA NO.:  [id number]   MEDICAL RECORD NO.:  [id number]          PATIENT TYPE:  EMS   LOCATION:  MAJO                         FACILITY:  [facility]   PHYSICIAN:  Olga Millers, M.D. LHCDATE OF BIRTH:  1934-08-20   DATE OF ADMISSION:  04/22/2004  DATE OF DISCHARGE:                                HISTORY & PHYSICAL   Mr. Shifflett is a 76 year old male with past medical history of hypertension,  hypertriglyceridemia, nephrolithiasis, and gastroesophageal reflux  disease/esophagitis, who presents with subendocardial myocardial infarction.  He has no prior cardiac history.  The patient does state that he has had  exertional chest pain for years.  He also states that he has had esophagitis  for approximately 20 years.  However, over the past six months, he has had  recurrent symptoms.  He developed substernal chest pain at approximately 3  o'clock this morning.  It was described as a pressure.  The pain did not  radiate to his neck or arm.  There was no associated shortness of breath,  nausea, vomiting, or diaphoresis.  It lasted through the morning and then  improved.  He did notice some increased dyspnea on exertion this afternoon.  His chest pain returned this evening, and he noted diaphoresis with this.  He, therefore, presented to the emergency room.  His enzymes were noted to  be positive.  We were asked to further evaluate.  Of note, his pain has  improved, but it is still at a mild level by his report.   ALLERGIES:  No known drug allergies.   MEDICATIONS AT HOME:  Nexium, Norvasc, folate, and a multivitamin.   SOCIAL HISTORY:  He does smoke an occasional cigar.  He consumes  approximately one-half bottle wine per night.   FAMILY HISTORY:  Positive for coronary artery disease in his sister.   PAST MEDICAL HISTORY:  Significant for hypertension and  hypertriglyceridemia, but there is no diabetes mellitus.  He does have a  history of  nephrolithiasis.  He has a long history of gastroesophageal  reflux disease and esophagitis.  He has had prior arm surgery.   REVIEW OF SYSTEMS:  He denies any headaches, fevers, or chills,  There is no  productive cough or hemoptysis.  There is no dysphagia, odynophagia, melena,  or hematochezia.  There is no dysuria or hematuria.  No rash or seizure  activity.  There is no orthopnea, PND.  There is no claudication.  Remaining  systems are negative.   PHYSICAL EXAMINATION:  VITAL SIGNS:  Blood pressure 159/93, pulse 83.  GENERAL:  Well-developed, well-nourished, in no acute distress.  Skin is  warm and dry. He does not appear to be depressed, and there is no peripheral  clubbing.  HEENT:  Unremarkable with normal eyelids.  NECK:  Supple with normally upstroke bilaterally, and there are no bruits  noted.  There is no jugular venous distention.  I cannot appreciate  thyromegaly.  CHEST:  Clear to auscultation with normal expansion.  CARDIOVASCULAR:  Regular rate and rhythm with normal S1 and S2.  There are  no murmurs, rubs,  or gallops noted.  ABDOMEN:  Nontender and nondistended.  Positive bowel sounds.  No  hepatosplenomegaly and no mass appreciated.  There is no abdominal bruit.  He has 2+ femoral pulses bilaterally and no bruits.  EXTREMITIES:  Show no edema, and I can palpate no cords.  He has 2+ dorsalis  pedis pulses bilaterally.  NEUROLOGIC:  Exam is grossly intact.   LABORATORY DATA AND OTHER STUDIES:  Hemoglobin 16.7, hematocrit 49.  Potassium is 4.2.  BUN and creatinine are 14 and 1.3, respectively.  His CK-  MB is 22.9 with troponin I of 1.55.   Electrocardiogram shows a normal sinus rhythm at a rate of 77.  He has an RV  conduction delay, and a prior inferior infarct is noted.  There is slight ST  elevation inferolaterally.   DIAGNOSES:  1.  Subendocardial myocardial infarction.  2.  Hypertension.  3.  Hypertriglyceridemia.  4.  History of nephrolithiasis.  5.   Gastroesophageal reflux disease/esophagitis.   PLAN:  Mr. Eisemann presents with a subendocardial myocardial infarction. We  will treat with aspirin, heparin, Integrilin, Plavix, Zocor, beta blockade,  and nitroglycerin.  If he is pain free, then we will plan to proceed with  catheterization tomorrow morning.  The risks and benefits have been  discussed with the patient, and he agrees to proceed.  We also need to  continue with risk factor modification.      BC/MEDQ  D:  04/22/2004  T:  04/22/2004  Job:  [id number]

## 2010-05-31 NOTE — Discharge Summary (Signed)
[name], [name] NO.:  [id number]   MEDICAL RECORD NO.:  [id number]          PATIENT TYPE:  INP   LOCATION:  2007                         FACILITY:  [facility]   PHYSICIAN:  Kerin Perna, M.D.  DATE OF BIRTH:  1934-01-15   DATE OF ADMISSION:  04/22/2004  DATE OF DISCHARGE:  04/30/2004                                 DISCHARGE SUMMARY   PRIMARY ADMITTING DIAGNOSIS:  Chest pain.   ADDITIONAL/DISCHARGE DIAGNOSES:  1.  Severe three-vessel coronary artery disease.  2.  Subendocardial myocardial infarction.  3.  Unstable angina.  4.  History of gastroesophageal reflux disease/esophagitis.  5.  Hypertension.  6.  Hyperlipidemia.  7.  Postoperative bronchitis.   PROCEDURES PERFORMED:  1.  Cardiac catheterization.  2.  Coronary artery bypass grafting x4 (left internal mammary artery to the      LAD, saphenous vein graft to the diagonal, saphenous vein graft to the      distal circumflex, saphenous vein graft to the first obtuse marginal).  3.  Endoscopic vein harvest right side and mid calf.   HISTORY:  The patient is a 76 year old male with no prior cardiac history,  but a past medical history positive for hypertension and  hypertriglyceridemia.  Over the past six months he has had recurrent  symptoms of exertional chest pain.  He has never sought specific treatment  for this problem, however.  On the date of this admission he was awakened  from sleep with an episode of substernal chest pain which he described as a  pressure and did not radiate to his neck or arm.  He had no associated  shortness of breath, nausea, vomiting or diaphoresis.  The pain lasted  throughout the morning and then resolved.  Later that evening he had some  increased dyspnea on exertion with a recurrence of the chest pain.  He also  developed some diaphoresis.  Because of these symptoms he presented to the  emergency department for further evaluation and treatment.  While in the ER  he  was noted to have elevation of cardiac enzymes and evidence of an acute  subendocardial myocardial infarction.  He was started on aspirin, heparin,  Integrilin, Plavix, Zocor, nitroglycerin and a beta-blocker and was admitted  for further cardiac workup.   HOSPITAL COURSE:  The patient was seen by Dr. Olga Millers and was taken  for cardiac catheterization on April 23, 2004.  He was found to have severe  three-vessel coronary artery disease which was not felt to be amenable to  percutaneous intervention.  Specifically he had a proximal 95% stenosis of  the LAD and 95% stenosis of a diagonal and 90% stenosis of the OM1 and 80%  stenosis of the distal circumflex.  His ejection fraction was 60%.  Because  of the severity of his disease and his recent MI, a cardiothoracic surgery  consultation was obtained.  Dr. Kathlee Nations Trigt saw the patient and reviewed  his films and agreed that surgical revascularization was his best course of  active.  After explanation of the risks, benefits and alternatives, the  patient  did agree to proceed with surgery.  He remained stable and pain-free  in the hospital during his preoperative workup.  He was taken to the  operating room on April 25, 2004 and underwent CABG x4 by Dr. Donata Clay as  described above.  He tolerated the procedure well and was transferred to the  SICU in stable condition.  He was able to be extubated shortly after  surgery.  He remained hemodynamically stable and was doing well on postop  day one.  At that time his chest tubes and hemodynamic monitoring devices  were removed and he was mobilized.  He was started on diuretic for postop  volume overload as well as started on beta-blockade.  He had some mild  abdominal distention with no nausea or vomiting.  He was treated  conservatively with Reglan and laxatives.  His diet was able to be advanced  as tolerated.  At present he is having normal bowel function.  Also, he has  postoperatively  had some low grade fevers associated with a productive cough  and discolored sputum.  His chest x-ray shows poor aeration in the bases.  He was started on Avelox as well as nebulizer treatments and mucolytics for  presumed postoperative bronchitis.  He has otherwise done very well  postoperatively.  He has remained in normal sinus rhythm throughout his  admission.  His blood pressure has been slowly trending upward and his beta-  blocker dose has been increased and he has been started on an ACE inhibitor.  He is diuresing well, but is still clinically volume overloaded.  His  surgical incision sites are all healing well.  He is ambulating in the halls  with cardiac rehab phase I and is doing very well.  He is tolerating regular  diet without problem.  His most recent labs show a hemoglobin of 10,  hematocrit 28.8, white count 11,000 and platelets 141, sodium 134, potassium  4.1, BUN 15, creatinine 1.2.  He has also been started on Zocor, however  because a history of a fatty liver as well as mild elevation of his liver  function tests on admission, (with an SGOT of 60, SGPT of 46) it is felt  that he will need close outpatient followup of his liver function tests with  further evaluation as needed by Dr. Victorino Dike who is the patients  gastroenterologist.  It is felt that if he continues to remain stable over  the next 24-48 hours, he will hopefully be ready for discharge home on  04/30/2004.   DISCHARGE MEDICATIONS:  1.  Enteric-coated aspirin 325 mg daily.  2.  Lopressor 50 mg b.i.d.  3.  Altace 2.5 mg daily.  4.  Zocor 40 mg daily.  5.  Avelox 400 mg daily x5 days.  6.  Lasix 40 mg daily x1 week.  7.  K-Dur 20 mEq daily x1 week.  8.  Nexium 40 mg daily.  9.  Folic acid as directed.  10. Multivitamin daily.  11. Tylox 1-2 q.4 hours p.r.n. for pain.   DISCHARGE INSTRUCTIONS:  He is asked to refrain from driving, heavy lifting or strenuous activity.  He may continue ambulating  daily and using his  incentive spirometer.  He may shower daily and clean his incisions with soap  and water.   DISCHARGE FOLLOWUP:  He will see Dr. Jens Som in two weeks.  He will have a  chest x-ray at that visit.  He is to bring his films the following week  to  his appointment at the [facility] office.  Our office will contact him with an  appointment to see Dr. Donata Clay in three weeks.  If he develops any  problems or has questions in the interim, he is asked to contact our office  immediately.      GC/MEDQ  D:  04/29/2004  T:  04/29/2004  Job:  [id number]   cc:   Olga Millers, M.D. [facility]   Ulyess Mort, M.D. [facility]   Titus Dubin. Alwyn Ren, M.D. [facility]

## 2010-05-31 NOTE — Assessment & Plan Note (Signed)
[facility]                           GASTROENTEROLOGY OFFICE NOTE   [name], [name]                     MRN:          [id number]  DATE:08/20/2005                            DOB:          [birth year]    [name] comes in concerned about esophageal pain.  He says when he drinks  anything spicy or hot or with caffeine it stresses his esophagus and he is  getting some pain up in his right chest anteriorly.  It has gotten worse  over the past 2 months and he feels like it needs to be reassessed.  I  talked to him about the shoulder pain which I think is really  musculoskeletal but he really is convinced that the other pain is at the  base of his esophagus.  Otherwise he has been doing fairly well.   PHYSICAL EXAMINATION:  GENERAL:  He looks good for his age.  VITAL SIGNS:  He weighs 201, blood pressure 146/86, pulse 82 and regular.  HEENT:  His oropharynx is negative.  NECK:  Negative.  CHEST:  Clear.  HEART:  Revealed a regular rhythm.  ABDOMEN:  Soft.  No masses or organomegaly.  RECTAL:  Deferred.  EXTREMITIES:  Unremarkable.   IMPRESSION:  1.  History of gastroesophageal reflux disease with exacerbation of his      symptoms at this time.  The patient is taking Nexium 40 mg daily.  2.  History of arteriosclerotic cardiovascular disease and status post      bypass graft.  3.  Status post cholecystectomy.   RECOMMENDATIONS:  1.  Take Zegerid b.i.d.  2.  Stop the Nexium.  3.  Use Carafate suspension  t.i.d. between meals.  4.  Schedule him for an EGD.                                   Ulyess Mort, MD   SML/MedQ  DD:  08/20/2005  DT:  08/20/2005  Job #:  [id number]

## 2010-05-31 NOTE — Op Note (Signed)
NAME:  [name], [name] NO.:  [id number]   MEDICAL RECORD NO.:  [id number]            PATIENT TYPE:   LOCATION:                                 FACILITY:   PHYSICIAN:  Alfonse Ras, MD        DATE OF BIRTH:   DATE OF PROCEDURE:  02/05/2005  DATE OF DISCHARGE:                                 OPERATIVE REPORT   PREOPERATIVE DIAGNOSIS:  Biliary dyskinesia.   POSTOPERATIVE DIAGNOSIS:  Biliary dyskinesia.   PROCEDURE:  Laparoscopic cholecystectomy.   SURGEON:  Alfonse Ras, MD   ASSISTANT:  Lebron Conners, M.D.   ANESTHESIA:  General.   DESCRIPTION:  The patient was taken to the operating room and placed in the  supine position. After adequate general anesthesia was induced using  endotracheal tube, the abdomen was prepped and draped in normal sterile  fashion. Using a transverse infraumbilical incision I dissected down to the  fascia. The fascia was opened vertically. No Vicryl pursestring suture was  placed around the fascial defect. A sound trocar was placed in the abdomen;  the abdomen was insufflated with continuous flow carbon dioxide. Under  direct vision, an 11-mm port was placed in subxiphoid region; two 5-mm ports  were placed in the right abdomen. Gallbladder was identified and retracted  cephalad.   Dissection at the neck of the gallbladder was begun until the critical view  of the cystic duct was visualized. It was clipped proximally; however, in  trying to make a ductotomy, the duct was incredibly small and fibrotic, and  could not be cannulated even with the right angle tip Bovie. For this  reason, cholangiogram could not be performed; and, therefore, the duct was  triply clipped and divided. The cystic artery was identified in a similar  fashion, triply clipped and divided. Gallbladder was taken off the  gallbladder bed using Bovie electrocautery, placed in an EndoCatch bag, and  removed through the umbilical port. Adequate hemostasis was  assured.  Pneumoperitoneum was released. The infraumbilical fascial defect was closed  with the #0 Vicryl pursestring suture. Skin incisions were closed with  subcuticular 4-0 Monocryl. Steri-Strips and sterile dressings were applied.  The patient tolerated the procedure well and went to PACU in good condition.      Alfonse Ras, MD  Electronically Signed     KRE/MEDQ  D:  02/05/2005  T:  02/05/2005  Job:  [id number]

## 2010-06-14 ENCOUNTER — Ambulatory Visit: Payer: Medicare Other | Admitting: Cardiology

## 2010-06-14 ENCOUNTER — Encounter: Payer: Self-pay | Admitting: *Deleted

## 2010-07-01 ENCOUNTER — Encounter: Payer: Self-pay | Admitting: Internal Medicine

## 2010-07-01 ENCOUNTER — Other Ambulatory Visit: Payer: Self-pay | Admitting: *Deleted

## 2010-07-02 MED ORDER — FLUOXETINE HCL 10 MG PO CAPS: 10.0000 mg | ORAL_CAPSULE | Freq: Every day | ORAL | Status: DC

## 2010-07-04 ENCOUNTER — Other Ambulatory Visit: Payer: Self-pay | Admitting: *Deleted

## 2010-07-04 ENCOUNTER — Encounter: Payer: Self-pay | Admitting: Cardiology

## 2010-07-04 ENCOUNTER — Ambulatory Visit (INDEPENDENT_AMBULATORY_CARE_PROVIDER_SITE_OTHER): Payer: Medicare Other | Admitting: Cardiology

## 2010-07-04 VITALS — BP 130/80 | HR 66 | Wt 207.0 lb

## 2010-07-04 DIAGNOSIS — E1159 Type 2 diabetes mellitus with other circulatory complications: Secondary | ICD-10-CM | POA: Insufficient documentation

## 2010-07-04 DIAGNOSIS — I10 Essential (primary) hypertension: Secondary | ICD-10-CM

## 2010-07-04 DIAGNOSIS — I2510 Atherosclerotic heart disease of native coronary artery without angina pectoris: Secondary | ICD-10-CM

## 2010-07-04 DIAGNOSIS — I152 Hypertension secondary to endocrine disorders: Secondary | ICD-10-CM | POA: Insufficient documentation

## 2010-07-04 DIAGNOSIS — E785 Hyperlipidemia, unspecified: Secondary | ICD-10-CM

## 2010-07-04 MED ORDER — SIMVASTATIN 40 MG PO TABS: 40.0000 mg | ORAL_TABLET | Freq: Every day | ORAL | Status: DC

## 2010-07-04 MED ORDER — METOPROLOL SUCCINATE ER 50 MG PO TB24: 50.0000 mg | ORAL_TABLET | Freq: Every day | ORAL | Status: DC

## 2010-07-04 NOTE — Assessment & Plan Note (Signed)
He is asymptomatic on medical therapy. We will continue with risk factor modification. He had a stress Cardiolite study in December of 2011 which demonstrated inferior lateral infarct consistent with his known disease. He had no mild peri-infarct ischemia. His ejection fraction 72%.

## 2010-07-04 NOTE — Assessment & Plan Note (Signed)
Blood pressure is well controlled on current medications. 

## 2010-07-04 NOTE — Patient Instructions (Signed)
We will schedule you for follow up blood work.  I will see you back in 6 months.

## 2010-07-04 NOTE — Telephone Encounter (Signed)
Faxed refills for simvastatin and metoprolol to [facility]

## 2010-07-04 NOTE — Progress Notes (Signed)
[name] Date of Birth: [birth year]   History of Present Illness: Mr. Prokop is seen today for followup. He states he hasn't been exercising regularly and he can tell that he is out of shape. He continues to have problems with insomnia. He has had no chest pain. We did reduce his simvastatin dose after his last visit because of elevated liver function studies. He is due for followup lab work.  Current Outpatient Prescriptions on File Prior to Visit  Medication Sig Dispense Refill  . aspirin 81 MG tablet Take 81 mg by mouth daily.        [name] FLUoxetine (PROZAC) 10 MG capsule Take 1 capsule (10 mg total) by mouth daily.  90 capsule  0  . isosorbide mononitrate (IMDUR) 60 MG 24 hr tablet Take 60 mg by mouth daily.        [name] latanoprost (XALATAN) 0.005 % ophthalmic solution 1 drop at bedtime.        [name] LORazepam (ATIVAN) 1 MG tablet Take 1 tablet (1 mg total) by mouth every 8 (eight) hours as needed for anxiety.  15 tablet  0  . Multiple Vitamin (MULTIVITAMIN) tablet Take 1 tablet by mouth daily.        . nitroGLYCERIN (NITROSTAT) 0.4 MG SL tablet Place 0.4 mg under the tongue every 5 (five) minutes as needed.        [name] omeprazole (PRILOSEC) 20 MG capsule Take 20 mg by mouth daily.        . valsartan (DIOVAN) 80 MG tablet Take 80 mg by mouth daily.        [name] DISCONTD: metoprolol (TOPROL-XL) 50 MG 24 hr tablet Take 50 mg by mouth daily.        [name] DISCONTD: simvastatin (ZOCOR) 40 MG tablet Take 40 mg by mouth at bedtime.          Allergies  Allergen Reactions  . Clindamycin     Past Medical History  Diagnosis Date  . Coronary artery disease     subendocranial MI; left inter. mamm to LAD, saph vein to diag; saph vein to distal circ; saph vein to 1st obtuse  . GERD (gastroesophageal reflux disease)   . Hypertension   . Hyperlipidemia   . Insomnia     Past Surgical History  Procedure Date  . Cardiac catheterization   . Coronary artery bypass graft     left inter. mamm to LAD, saph  vein to diag; saph vein to distal circ; saph vein to 1st obtuse  . Cholecystectomy   . Prostatectomy   . Rotator cuff repair   . Debridement tennis elbow     History  Smoking status  . Former Smoker  Smokeless tobacco  . Not on file    History  Alcohol Use  . Yes    Family History  Problem Relation Age of Onset  . Heart disease Sister     CABGX 1 sister and stents X1 sister    Review of Systems:  All other systems were reviewed and are negative.  Physical Exam: BP 130/80  Pulse 66  Wt 207 lb (93.895 kg) He is an overweight white male in no acute distress. HEENT normocephalic, atraumatic. Pupils are equal round and reactive to light. Oropharynx is clear. Neck is supple without JVD, adenopathy, thyromegaly, or bruits. Lungs are clear. Cardiac exam reveals a regular rate and rhythm without gallop or murmur. His abdomen is soft and nontender. He has no edema. Pedal pulses are good.  LABORATORY DATA:   Assessment / Plan:

## 2010-07-04 NOTE — Assessment & Plan Note (Signed)
We will followup on chemistries and lipid panel on lower dose of simvastatin.

## 2010-07-09 ENCOUNTER — Other Ambulatory Visit (INDEPENDENT_AMBULATORY_CARE_PROVIDER_SITE_OTHER): Payer: Medicare Other | Admitting: *Deleted

## 2010-07-09 DIAGNOSIS — I2510 Atherosclerotic heart disease of native coronary artery without angina pectoris: Secondary | ICD-10-CM

## 2010-07-09 LAB — LIPID PANEL
HDL: 50.8 mg/dL (ref >39.00)
Total CHOL/HDL Ratio: 3
Triglycerides: 178 mg/dL — ABNORMAL HIGH (ref 0.0–149.0)
VLDL: 35.6 mg/dL (ref 0.0–40.0)

## 2010-07-09 LAB — BASIC METABOLIC PANEL
CO2: 28 mEq/L (ref 19–32)
Calcium: 9 mg/dL (ref 8.4–10.5)
Creatinine, Ser: 1.1 mg/dL (ref 0.4–1.5)
GFR: 67.37 mL/min (ref >60.00)

## 2010-07-09 LAB — HEPATIC FUNCTION PANEL
Albumin: 4.2 g/dL (ref 3.5–5.2)
Bilirubin, Direct: 0.1 mg/dL (ref 0.0–0.3)
Total Protein: 6.5 g/dL (ref 6.0–8.3)

## 2010-07-10 ENCOUNTER — Telehealth: Payer: Self-pay | Admitting: *Deleted

## 2010-07-10 NOTE — Telephone Encounter (Signed)
Message copied by Lorayne Bender on Wed Jul 10, 2010 10:57 AM ------      Message from: [place], PETER M      Created: Tue Jul 09, 2010  9:12 PM       LFTs improved. Lipids look very good except mild increase in trigs. Continue current RX.

## 2010-07-10 NOTE — Telephone Encounter (Signed)
Notified of lab results  Will send to Dr. Alwyn Ren

## 2010-07-18 ENCOUNTER — Other Ambulatory Visit: Payer: Self-pay | Admitting: Internal Medicine

## 2010-07-18 MED ORDER — LORAZEPAM 1 MG PO TABS: 1.0000 mg | ORAL_TABLET | Freq: Three times a day (TID) | ORAL | Status: DC | PRN

## 2010-07-18 NOTE — Telephone Encounter (Signed)
RX called in .

## 2010-08-08 ENCOUNTER — Encounter: Payer: Self-pay | Admitting: Internal Medicine

## 2010-08-08 ENCOUNTER — Ambulatory Visit (INDEPENDENT_AMBULATORY_CARE_PROVIDER_SITE_OTHER): Payer: Medicare Other | Admitting: Internal Medicine

## 2010-08-08 DIAGNOSIS — K5732 Diverticulitis of large intestine without perforation or abscess without bleeding: Secondary | ICD-10-CM

## 2010-08-08 DIAGNOSIS — K5792 Diverticulitis of intestine, part unspecified, without perforation or abscess without bleeding: Secondary | ICD-10-CM

## 2010-08-08 DIAGNOSIS — R109 Unspecified abdominal pain: Secondary | ICD-10-CM

## 2010-08-08 MED ORDER — HYOSCYAMINE SULFATE 0.125 MG SL SUBL: 0.1250 mg | SUBLINGUAL_TABLET | SUBLINGUAL | Status: AC | PRN

## 2010-08-08 MED ORDER — METRONIDAZOLE 500 MG PO TABS: 500.0000 mg | ORAL_TABLET | Freq: Three times a day (TID) | ORAL | Status: AC

## 2010-08-08 MED ORDER — CIPROFLOXACIN HCL 500 MG PO TABS: 500.0000 mg | ORAL_TABLET | Freq: Two times a day (BID) | ORAL | Status: AC

## 2010-08-08 NOTE — Progress Notes (Signed)
  Subjective:    Patient ID: [name], male    DOB: 07-27-1934, 76 y.o.   MRN: [id number]  HPI ABDOMINAL PAIN: Location: BLQ  Onset: 7/24   Radiation: no  Severity: up to 5 Quality: sharp 7/24- 25, now cramping initially  with ambulation  Duration: a few minutes   Better with: after ambulating few minutes; also better with BMs  Symptoms Nausea/Vomiting: yes, nausea w/o vomiting  Diarrhea: yes, 7/24 as frank stool; relieved with Immodium AD X 1 . City water; no antibiotics or travel Constipation: yes, slightly  Melena/BRBPR: no  Hematemesis: no  Anorexia: yes, some  Fever/Chills: fever & chills last night & today Dysuria/ hematuria/pyuria: no   Wt loss: no  EtOH use: yes, 3/day  NSAIDs/ASA: yes, & tylenol    Past Surgeries:  Colonoscopy 2008:? Diverticulosis, Dr Arlyce Dice; Upper Endo 2002 : Hiatal Hernia. There was some question of Barrett's esophagus but  this was not diagnosed. He still has his appendix. He has had a prostatectomy        Review of Systems stools thin X 3 days     Objective:   Physical Exam General appearance:  good  nourishment w/o distress.  Eyes: No conjunctival inflammation or scleral icterus is present.  Oral exam: Dental hygiene is good; lips and gums are healthy appearing.There is no oropharyngeal erythema or exudate noted. Tongue moist  Heart:  Normal rate and regular rhythm. S1 and S2 normal without gallop, murmur, click, rub or other extra sounds     Lungs:Chest clear to auscultation; no wheezes, rhonchi,rales ,or rubs present.No increased work of breathing.   Abdomen: bowel sounds normal, soft  But slightly tender LLQ > RLQ  without masses, or organomegaly .Ventral  hernia noted.  No guarding or rebound   Skin:Warm & dry.  Intact without suspicious lesions or rashes ; no jaundice or tenting  Lymphatic: No lymphadenopathy is noted about the head, neck, axilla, or inguinal areas.   Genitourinary/digital rectal exam: No hernias  present. A varicocele is noted on the left. Prostate surgically absent. There is no adnexal tenderness             Assessment & Plan:  #1 abdominal pain bilateral lower quadrants. Clinical diagnosis is diverticulitis  Plan: See orders and patient instructions.

## 2010-08-08 NOTE — Patient Instructions (Signed)
Stay on clear liquids for 48-72 hours or until bowels are normal.This would include  jello, sherbert (NOT ice cream), Lipton's chicken noodle soup(NOT cream based soups),Gatorade Lite, flat Ginger ale (without High Fructose Corn Syrup),dry toast or crackers, baked potato.No milk , dairy or grease until bowels are formed. Align , a [employer] , daily if stools are loose. Immodium AD for frankly watery stool. Report increasing pain, fever or rectal bleeding To ER if pain persists or progresses or is associaled with Warning Signs as discussed.

## 2010-08-12 ENCOUNTER — Telehealth: Payer: Self-pay | Admitting: Internal Medicine

## 2010-08-12 NOTE — Telephone Encounter (Signed)
Left msg for pt to return call.

## 2010-08-12 NOTE — Telephone Encounter (Signed)
Finish both meds  ; add Lomotil 2.5  Mg prn frank diiarrhea, not loose stool. #12

## 2010-08-12 NOTE — Telephone Encounter (Signed)
Pt called says that is abdominal pain is better but now diarrhea has returned and he has been taking Immodium and would like to know what to do.

## 2010-08-13 MED ORDER — DIPHENOXYLATE-ATROPINE 2.5-0.025 MG PO TABS: ORAL_TABLET | ORAL | Status: DC

## 2010-08-13 MED ORDER — LORAZEPAM 1 MG PO TABS: 1.0000 mg | ORAL_TABLET | Freq: Three times a day (TID) | ORAL | Status: DC | PRN

## 2010-08-13 NOTE — Telephone Encounter (Signed)
Spoke with patient, patient aware of instructions

## 2010-08-14 ENCOUNTER — Other Ambulatory Visit: Payer: Self-pay | Admitting: Cardiology

## 2010-08-14 NOTE — Telephone Encounter (Signed)
escribe medication per fax request  

## 2010-08-15 ENCOUNTER — Other Ambulatory Visit: Payer: Self-pay | Admitting: Cardiology

## 2010-08-15 MED ORDER — METOPROLOL SUCCINATE ER 50 MG PO TB24: 50.0000 mg | ORAL_TABLET | Freq: Every day | ORAL | Status: DC

## 2010-08-15 NOTE — Telephone Encounter (Signed)
Pt called to get refill on prescription, pt is leaving town early tomorrow morning, pt would like to have filled today prior to his leaving, uses [facility] [address], script is for metoprolol and pt needs ASAP

## 2010-08-15 NOTE — Telephone Encounter (Signed)
escribe medication per fax request  

## 2010-08-24 ENCOUNTER — Other Ambulatory Visit: Payer: Self-pay | Admitting: Cardiology

## 2010-08-26 NOTE — Telephone Encounter (Signed)
escribe medication per fax request  

## 2010-10-01 ENCOUNTER — Other Ambulatory Visit: Payer: Self-pay | Admitting: Internal Medicine

## 2010-10-02 MED ORDER — LORAZEPAM 1 MG PO TABS: 1.0000 mg | ORAL_TABLET | Freq: Three times a day (TID) | ORAL | Status: DC | PRN

## 2010-10-02 NOTE — Telephone Encounter (Signed)
RX faxed

## 2010-10-07 ENCOUNTER — Other Ambulatory Visit: Payer: Self-pay | Admitting: Internal Medicine

## 2010-10-07 MED ORDER — FLUOXETINE HCL 10 MG PO CAPS: 10.0000 mg | ORAL_CAPSULE | Freq: Every day | ORAL | Status: DC

## 2010-10-07 NOTE — Telephone Encounter (Signed)
RX sent to pharmacy  

## 2010-10-28 LAB — BASIC METABOLIC PANEL
Calcium: 8.5
Calcium: 8.9
GFR calc Af Amer: >60
GFR calc Af Amer: >60
GFR calc non Af Amer: >60
GFR calc non Af Amer: >60
Glucose, Bld: 93
Sodium: 136
Sodium: 138

## 2010-10-28 LAB — CBC
Hemoglobin: 13.5
Hemoglobin: 14.1
RBC: 4.45
RBC: 4.67
RBC: 5.19
RDW: 13.5
WBC: 6.1

## 2010-10-28 LAB — CARDIAC PANEL(CRET KIN+CKTOT+MB+TROPI)
Relative Index: INVALID
Relative Index: INVALID
Total CK: 77
Total CK: 82

## 2010-10-28 LAB — COMPREHENSIVE METABOLIC PANEL
ALT: 29
AST: 23
Calcium: 9.5
GFR calc Af Amer: >60
Sodium: 138
Total Protein: 6.4

## 2010-10-28 LAB — TROPONIN I: Troponin I: 0.01

## 2010-10-28 LAB — APTT: aPTT: 34

## 2010-10-28 LAB — CK TOTAL AND CKMB (NOT AT ARMC)
CK, MB: 1.9
Total CK: 109

## 2010-10-28 LAB — LIPID PANEL
Total CHOL/HDL Ratio: 3
VLDL: 35

## 2010-11-28 ENCOUNTER — Telehealth: Payer: Self-pay | Admitting: Internal Medicine

## 2010-11-28 MED ORDER — LORAZEPAM 1 MG PO TABS: 1.0000 mg | ORAL_TABLET | Freq: Three times a day (TID) | ORAL | Status: DC | PRN

## 2010-11-28 NOTE — Telephone Encounter (Signed)
RX called into pharmacy

## 2010-11-28 NOTE — Telephone Encounter (Signed)
Patient needs refill lorazapam - [facility] [address] - patient going out of town today

## 2010-12-20 ENCOUNTER — Ambulatory Visit (INDEPENDENT_AMBULATORY_CARE_PROVIDER_SITE_OTHER): Payer: Medicare Other | Admitting: Cardiology

## 2010-12-20 ENCOUNTER — Encounter: Payer: Self-pay | Admitting: Cardiology

## 2010-12-20 VITALS — BP 126/82 | HR 64 | Ht 70.5 in | Wt 212.0 lb

## 2010-12-20 DIAGNOSIS — I10 Essential (primary) hypertension: Secondary | ICD-10-CM

## 2010-12-20 DIAGNOSIS — E785 Hyperlipidemia, unspecified: Secondary | ICD-10-CM

## 2010-12-20 DIAGNOSIS — R251 Tremor, unspecified: Secondary | ICD-10-CM

## 2010-12-20 DIAGNOSIS — R259 Unspecified abnormal involuntary movements: Secondary | ICD-10-CM

## 2010-12-20 DIAGNOSIS — G250 Essential tremor: Secondary | ICD-10-CM | POA: Insufficient documentation

## 2010-12-20 DIAGNOSIS — I2510 Atherosclerotic heart disease of native coronary artery without angina pectoris: Secondary | ICD-10-CM

## 2010-12-20 MED ORDER — NITROGLYCERIN 0.4 MG SL SUBL: 0.4000 mg | SUBLINGUAL_TABLET | SUBLINGUAL | Status: DC | PRN

## 2010-12-20 NOTE — Patient Instructions (Signed)
Continue your current therapy. Let me know if there is a change in your chest pain symptoms.  I will see you again in 6 months.

## 2010-12-20 NOTE — Assessment & Plan Note (Signed)
This is a new complaint. His exam is fairly unremarkable. I have asked that he discuss this with Dr. Alwyn Ren.

## 2010-12-20 NOTE — Assessment & Plan Note (Signed)
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 The vein graft to the diagonal is occluded as is the native diagonal. There is also high-grade stenosis in the fourth marginal branch and the graft to that is occluded. These lesions were not suitable for intervention. Since his anginal pattern is stable we will continue with his current medication including nitrates and beta blocker therapy. I offered the addition of calcium channel blocker or Ranexa but he feels comfortable with his current symptomatology. We did refill his sublingual nitroglycerin prescription. If he has any acceleration of his chest pain symptoms he is to contact me. Otherwise I'll see him back again in 6 months.

## 2010-12-20 NOTE — Progress Notes (Signed)
Darl Pikes Date of Birth: [birth year]   History of Present Illness: Mr. Coury is seen today for followup. He does report that he has intermittent chest pain. He describes this is mild. It tends to come and go and radiates from his left breast area to his back. Sometimes is associated with exertion. At other times it feels more like his reflux symptoms. He has had it at night and it goes away if he rolls over. There has been no significant change in the pattern of his pain over the past 6 months. He also complains of a new tremor.  Current Outpatient Prescriptions on File Prior to Visit  Medication Sig Dispense Refill  . aspirin 81 MG tablet Take 81 mg by mouth daily.        . diphenoxylate-atropine (LOMOTIL) 2.5-0.025 MG per tablet As needed for loose stools (max 8 times daily)  12 tablet  0  . FLUoxetine (PROZAC) 10 MG capsule Take 1 capsule (10 mg total) by mouth daily.  90 capsule  2  . isosorbide mononitrate (IMDUR) 60 MG 24 hr tablet Take 60 mg by mouth daily.        [name] latanoprost (XALATAN) 0.005 % ophthalmic solution 1 drop at bedtime.        [name] LORazepam (ATIVAN) 1 MG tablet Take 1 tablet (1 mg total) by mouth every 8 (eight) hours as needed for anxiety.  30 tablet  0  . metoprolol (TOPROL-XL) 50 MG 24 hr tablet Take 1 tablet (50 mg total) by mouth daily.  30 tablet  5  . Multiple Vitamin (MULTIVITAMIN) tablet Take 1 tablet by mouth daily.        . nitroGLYCERIN (NITROSTAT) 0.4 MG SL tablet Place 0.4 mg under the tongue every 5 (five) minutes as needed.        [name] omeprazole (PRILOSEC) 20 MG capsule Take 20 mg by mouth daily.        . simvastatin (ZOCOR) 40 MG tablet Take 1 tablet (40 mg total) by mouth at bedtime.  90 tablet  3  . valsartan (DIOVAN) 80 MG tablet Take 80 mg by mouth daily.        [name] DISCONTD: DIOVAN 160 MG tablet TAKE 1 TABLET BY MOUTH EVERY DAY  30 tablet  5    Allergies  Allergen Reactions  . Clindamycin     ? reaction    Past Medical History  Diagnosis  Date  . Coronary artery disease     subendocranial MI; left inter. mamm to LAD, saph vein to diag; saph vein to distal circ; saph vein to 1st obtuse  . GERD (gastroesophageal reflux disease)   . Hypertension   . Hyperlipidemia   . Insomnia   . Left shoulder pain   . Hypertriglyceridemia   . Nephrolithiasis   . Esophagitis   . Bronchitis 2006    Postoperative bronchitis  . Unstable angina   . History of atherosclerotic cardiovascular disease   . Tennis elbow     Past Surgical History  Procedure Date  . Cardiac catheterization   . Coronary artery bypass graft     left inter. mamm to LAD, saph vein to diag; saph vein to distal circ; saph vein to 1st obtuse  . Cholecystectomy   . Prostatectomy   . Rotator cuff repair   . Debridement tennis elbow     History  Smoking status  . Former Smoker  Smokeless tobacco  . Not on file    History  Alcohol Use  . Yes    Family History  Problem Relation Age of Onset  . Heart disease Sister     CABGX 1 sister and stents X1 sister  . Parkinsonism Father     Review of Systems: As noted in history of present illness. All other systems were reviewed and are negative.  Physical Exam: BP 126/82  Pulse 64  Ht 5' 10.5" (1.791 m)  Wt 212 lb (96.163 kg)  BMI 29.99 kg/m2 He is an overweight white male in no acute distress. HEENT normocephalic, atraumatic. Pupils are equal round and reactive to light. Oropharynx is clear. Neck is supple without JVD, adenopathy, thyromegaly, or bruits. Lungs are clear. Cardiac exam reveals a regular rate and rhythm without gallop or murmur. His abdomen is soft and nontender. He has no edema. Pedal pulses are good. LABORATORY DATA:   Assessment / Plan:

## 2010-12-20 NOTE — Assessment & Plan Note (Signed)
Blood pressure is well controlled today. We will continue on his current therapy.

## 2010-12-20 NOTE — Assessment & Plan Note (Signed)
His last lipid panel showed mild elevation of his triglycerides. Have recommended working on weight loss and regular aerobic exercise.

## 2010-12-25 ENCOUNTER — Other Ambulatory Visit: Payer: Self-pay | Admitting: Cardiology

## 2011-01-02 ENCOUNTER — Ambulatory Visit: Payer: Medicare Other | Admitting: Cardiology

## 2011-02-03 ENCOUNTER — Ambulatory Visit (INDEPENDENT_AMBULATORY_CARE_PROVIDER_SITE_OTHER): Payer: Medicare Other

## 2011-02-03 DIAGNOSIS — J019 Acute sinusitis, unspecified: Secondary | ICD-10-CM

## 2011-02-10 ENCOUNTER — Other Ambulatory Visit: Payer: Self-pay | Admitting: Cardiology

## 2011-02-10 ENCOUNTER — Other Ambulatory Visit: Payer: Self-pay

## 2011-02-10 MED ORDER — ISOSORBIDE MONONITRATE ER 60 MG PO TB24: 60.0000 mg | ORAL_TABLET | Freq: Every day | ORAL | Status: DC

## 2011-02-10 NOTE — Telephone Encounter (Signed)
Pt [facility] 90 day supply of [facility]ide [facility] [address]

## 2011-02-24 ENCOUNTER — Other Ambulatory Visit: Payer: Self-pay | Admitting: *Deleted

## 2011-02-24 MED ORDER — METOPROLOL SUCCINATE ER 50 MG PO TB24: 50.0000 mg | ORAL_TABLET | Freq: Every day | ORAL | Status: DC

## 2011-02-24 NOTE — Telephone Encounter (Signed)
Refilled metoprolol 

## 2011-02-28 ENCOUNTER — Telehealth: Payer: Self-pay | Admitting: Cardiology

## 2011-02-28 NOTE — Telephone Encounter (Signed)
Patient called stating insurance co raised co pay on diovan.States wants to know if Dr.Jordan can prescribe another medication that don't cost as much.Advised will check with Dr.Jordan and call him back next week.

## 2011-02-28 NOTE — Telephone Encounter (Signed)
New msg Pt called.  He is calling about his diovan medication. He wants to talk to you about this med going to generic thru his ins. Please call

## 2011-03-04 MED ORDER — LOSARTAN POTASSIUM 50 MG PO TABS: 50.0000 mg | ORAL_TABLET | Freq: Every day | ORAL | Status: DC

## 2011-03-04 NOTE — Telephone Encounter (Signed)
Pt aware of instructions.  [facility] [address]

## 2011-03-04 NOTE — Telephone Encounter (Signed)
We can certainly switch him to losartan start at 50 mg daily. Stop diovan. Letha Mirabal [place] MD, [facility]

## 2011-03-04 NOTE — Telephone Encounter (Signed)
Will need to forward to Dr [place] for his review and any orders

## 2011-03-04 NOTE — Telephone Encounter (Signed)
Fu call Pt was calling back about this issue. He hasnt heard anything

## 2011-03-12 ENCOUNTER — Telehealth: Payer: Self-pay

## 2011-03-12 ENCOUNTER — Ambulatory Visit (INDEPENDENT_AMBULATORY_CARE_PROVIDER_SITE_OTHER): Payer: Medicare Other | Admitting: Family Medicine

## 2011-03-12 VITALS — BP 125/77 | HR 65 | Temp 98.7°F | Resp 16 | Ht 70.5 in | Wt 214.0 lb

## 2011-03-12 DIAGNOSIS — J019 Acute sinusitis, unspecified: Secondary | ICD-10-CM

## 2011-03-12 DIAGNOSIS — J329 Chronic sinusitis, unspecified: Secondary | ICD-10-CM

## 2011-03-12 DIAGNOSIS — R05 Cough: Secondary | ICD-10-CM

## 2011-03-12 DIAGNOSIS — R059 Cough, unspecified: Secondary | ICD-10-CM

## 2011-03-12 MED ORDER — CEFDINIR 300 MG PO CAPS: 300.0000 mg | ORAL_CAPSULE | Freq: Two times a day (BID) | ORAL | Status: AC

## 2011-03-12 MED ORDER — FLUTICASONE PROPIONATE 50 MCG/ACT NA SUSP: 2.0000 | Freq: Every day | NASAL | Status: DC

## 2011-03-12 NOTE — Progress Notes (Signed)
Patient Name: [name] Date of Birth: [birth year] Medical Record Number: [id number] Gender: male Date of Encounter: 03/12/2011  History of Present Illness:  [name] is a 77 y.o. very pleasant male patient who presents with the following:  Here about a month ago and treated for sinusitis with amoxicillin.  He does not feel that he ever really got well.  Notes aches, sinus pressure and pain. flonase does help- he started back on this just last night.  Ears feel stuffy.  Not having a cough but is sneezing.  Lying down to rest does not help, but he feels tired.  He has not noted a fever but has had chills.  Slight ST.  Has noted some nausea but no vomiting or diarrhea.    He last had a stress test about a year ago per his report, and it looked fine.  He has not noted any heart palpitations or anginal pain.  Patient Active Problem List  Diagnoses  . HYPERLIPIDEMIA  . ANXIETY STATE NOS  . EUSTACHIAN TUBE DYSFUNCTION, BILATERAL  . EXTERNAL HEMORRHOIDS  . ESOPHAGEAL REFLUX  . DIVERTICULOSIS, COLON  . ECCHYMOSES  . PERSONAL HISTORY OF COLONIC POLYPS  . Coronary artery disease  . Hypertension  . Tremor   Past Medical History  Diagnosis Date  . Coronary artery disease     subendocranial MI; left inter. mamm to LAD, saph vein to diag; saph vein to distal circ; saph vein to 1st obtuse  . GERD (gastroesophageal reflux disease)   . Hypertension   . Hyperlipidemia   . Insomnia   . Left shoulder pain   . Hypertriglyceridemia   . Nephrolithiasis   . Esophagitis   . Bronchitis 2006    Postoperative bronchitis  . Unstable angina   . History of atherosclerotic cardiovascular disease   . Tennis elbow    Past Surgical History  Procedure Date  . Cardiac catheterization   . Coronary artery bypass graft     left inter. mamm to LAD, saph vein to diag; saph vein to distal circ; saph vein to 1st obtuse  . Cholecystectomy   . Prostatectomy   . Rotator cuff repair   . Debridement  tennis elbow    History  Substance Use Topics  . Smoking status: Former Games developer  . Smokeless tobacco: Not on file  . Alcohol Use: Yes   Family History  Problem Relation Age of Onset  . Heart disease Sister     CABGX 1 sister and stents X1 sister  . Parkinsonism Father    Allergies  Allergen Reactions  . Clindamycin     ? reaction    Medication list has been reviewed and updated.  Review of Systems: As per HPI- otherwise negative.  Physical Examination: Filed Vitals:   03/12/11 1105  BP: 125/77  Pulse: 65  Temp: 98.7 F (37.1 C)  TempSrc: Oral  Resp: 16  Height: 5' 10.5" (1.791 m)  Weight: 214 lb (97.07 kg)    Body mass index is 30.27 kg/(m^2).  GEN: WDWN, NAD, Non-toxic, A & O x 3, obese HEENT: Atraumatic, Normocephalic. Neck supple. No masses, No LAD.  Oropharynx and TM wnl bilaterally Ears and Nose: No external deformity. CV: some irregularity- question PVCs vs afib, No M/G/R. No JVD. No thrill. No extra heart sounds. PULM: CTA B, no wheezes, crackles, rhonchi. No retractions. No resp. distress. No accessory muscle use. ABD: S, NT, ND, +BS. No rebound. No HSM. EXTR: No c/c/e NEURO Normal gait.  PSYCH:  Normally interactive. Conversant. Not depressed or anxious appearing.  Calm demeanor.   EKG: normal sinus rhythm, suspect pt had PVCs on ascultation but none on 3 page strip.  No ST elevation or depression  Assessment and Plan: 1. Sinusitis  fluticasone (FLONASE) 50 MCG/ACT nasal spray, cefdinir (OMNICEF) 300 MG capsule   Encouraged him to try using the flonase for a couple of days before he starts Omnicef- I wonder if he is having allergies as opposed to a bacterial infection.  However, he has the omnicef rx on hold if he does not get better.  Continue regular follow- up with cardiology.  Let us know if he is not better in a few days- Sooner if worse.

## 2011-03-12 NOTE — Telephone Encounter (Signed)
.  [facility] Pt would like a referral to an allergist. Patient saw Dr. Patsy Lager on 03/12/11 and pt states he was diagnosed with an allergy issue.  Patient states would like to proceed with allergy shot per an allergist and would like to be referred by Dr. Patsy Lager.

## 2011-03-14 NOTE — Progress Notes (Signed)
Addended by: Abbe Amsterdam C on: 03/14/2011 10:18 AM   Modules accepted: Orders

## 2011-04-01 ENCOUNTER — Encounter: Payer: Self-pay | Admitting: Family Medicine

## 2011-04-08 ENCOUNTER — Telehealth: Payer: Self-pay | Admitting: Cardiology

## 2011-04-08 NOTE — Telephone Encounter (Signed)
New msg: [employer] calling needing a DX for pt. Please return call to discuss further.   The issues will be terminated if not addressed within 24 hours.   Please return call to discuss further ASAP.

## 2011-04-08 NOTE — Telephone Encounter (Signed)
Spoke to [employer] was told diovan will not be approved unless patient has tried another drug in same class that has been tried and failed.Will review patient's chart and call [employer] back.

## 2011-04-09 NOTE — Telephone Encounter (Signed)
Patient called no answer.LMTC. 

## 2011-04-10 ENCOUNTER — Telehealth: Payer: Self-pay | Admitting: *Deleted

## 2011-04-10 NOTE — Telephone Encounter (Signed)
Patient called was told insurance want cover diovan.Patient stated he already new they want cover.Diovan was d/c 03/04/11 and Dr.Jordan prescribed losartan.

## 2011-04-10 NOTE — Telephone Encounter (Signed)
Pt wants to his Diovan decreased. The [employer] and [employer] denied it.they said if the doctor wants to approve this medication they need to file an appeal the number to this is the [id number]

## 2011-04-10 NOTE — Telephone Encounter (Signed)
Spoke to patient earlier this morning diovan was stopped 03/04/11 and losartan prescribed.

## 2011-04-10 NOTE — Telephone Encounter (Signed)
FU Call: Pt returning call to Cheryl. Please return pt call to discuss further.  

## 2011-04-24 ENCOUNTER — Other Ambulatory Visit: Payer: Self-pay | Admitting: Urology

## 2011-04-24 ENCOUNTER — Encounter (HOSPITAL_BASED_OUTPATIENT_CLINIC_OR_DEPARTMENT_OTHER): Payer: Self-pay | Admitting: *Deleted

## 2011-04-25 ENCOUNTER — Encounter (HOSPITAL_BASED_OUTPATIENT_CLINIC_OR_DEPARTMENT_OTHER): Payer: Self-pay | Admitting: *Deleted

## 2011-04-28 ENCOUNTER — Encounter (HOSPITAL_BASED_OUTPATIENT_CLINIC_OR_DEPARTMENT_OTHER): Payer: Self-pay | Admitting: *Deleted

## 2011-04-28 NOTE — Progress Notes (Addendum)
NPO AFTER MN . ARRIVES AT 1115. NEEDS ISTAT. CURRENT 03-12-2011 W/ CHART. WILL TAKE PRILOSEC AND ZOCOR AM OF SURG. W/ SIP OF WATER.  REVIEWED CHART W/ DR EWELL MDA, PT NEEDS CARDIAC CLEARENCE. LM W/ SELITA (OR SCHEDULER FOR DR Isabel Caprice). LAST VISIT W/ DR [place] 12-20-2010.

## 2011-04-29 ENCOUNTER — Telehealth: Payer: Self-pay | Admitting: Cardiology

## 2011-04-29 NOTE — H&P (Signed)
[name] is an 77 y.o. male.   Chief Complaint: Voiding issues with recurrent urethral stricture disease/ Bladder neck contracture HPI:[name] returns for 6 month follow-up.  He is currently 77 years of age.  The patient is status post robotic prostatectomy in April 2008.  He did have a more worrisome final pathology with primary Gleason's 4 component and a total score of 7.  The tumor was bilateral.  He did have an area with a positive margin but his PSAs postoperatively has been 0.0 and we think continued observation is in his best interest.  The patient generally has relatively good urinary control but does have ongoing stress events primarily with increased activity such as lifting, golfing, etc.  Under normal circumstances with daily activities he does not leak, but he is forced to continue to wear at least a thin pad on a regular basis.  This is at least moderately bothersome to him.  The patient had preop erectile dysfunction which was not further pursued.  No real new complaints today.  He did present last month with malodorous urine and some slight dysuria. His urine was abnormal and a culture did demonstrate growth with E. coli. His symptoms have resolved. He has a crystal clear today. He does note that his urinary stream has continued to be somewhat weak. It is unclear whether he is emptying his bladder completely. Again, PSA last month was 0.0, which is quite encouraging. He is now about 5 years out from his surgery.   Past Medical History  Diagnosis Date  . GERD (gastroesophageal reflux disease)   . Hypertension   . Hyperlipidemia   . Insomnia   . Hypertriglyceridemia   . History of atherosclerotic cardiovascular disease   . Bladder neck contracture   . Stable angina   . History of myocardial infarction 2006   S/P CABG  . S/P CABG x 4 2006  . Coronary artery disease CARDIOLOGIST- DR [place]-  LAST VISIT NOTE 12-20-2010 IN EPIC AND W/ CHART    subendocranial MI; left  inter. mamm to LAD, saph vein to diag; saph vein to distal circ; saph vein to 1st obtuse  . Arthritis   . H/O hiatal hernia   . History of kidney stones   . Glaucoma BILATERAL  . Cataract immature BILATERAL  . Frequency of urination   . Urgency of urination   . Nocturia   . History of prostate cancer S/P PROSTATECTOMY-  NO RECURRENCE    FOLLOWED BY DR Isabel Caprice    Past Surgical History  Procedure Date  . Debridement tennis elbow 2002    RIGHT  . Robot assisted laparoscopic radical prostatectomy 05-11-2006  . Cardiac catheterization 07-30-2006  DR [place]    POST CABG / OCCLUDED DIAGONAL &  FOURTH OBTUSE MARGINAL GRAFTS/ NORMAL LVF/ PATENT LIMA TO LAD & SEPHENOUS VEIN GRAFT TO 1ST OBTUSE MARGINAL   . Cardiac catheterization 04-23-2004    CRITICAL THREE-VESSEL CAD/ PRESERVED LVF  . Laparoscopic cholecystectomy 02-05-2005  . Coronary artery bypass graft 04-25-2004  X4 VESSELS    left inter. mamm to LAD, saph vein to diag; saph vein to distal circ; saph vein to 1st obtuse    Family History  Problem Relation Age of Onset  . Heart disease Sister     CABGX 1 sister and stents X1 sister  . Parkinsonism Father    Social History:  reports that he has quit smoking. His smoking use included Cigarettes and Cigars. He quit after 3 years of use.  He has never used smokeless tobacco. He reports that he drinks about 8.4 ounces of alcohol per week. He reports that he does not use illicit drugs.  Allergies:  Allergies  Allergen Reactions  . Clindamycin Other (See Comments)    UNKNOWN    No current facility-administered medications on file as of .   Medications Prior to Admission  Medication Sig Dispense Refill  . aspirin 81 MG tablet Take 81 mg by mouth daily.       [name] FLUoxetine (PROZAC) 10 MG capsule Take 1 capsule (10 mg total) by mouth daily.  90 capsule  2  . isosorbide mononitrate (IMDUR) 60 MG 24 hr tablet Take 60 mg by mouth every evening.      . latanoprost (XALATAN) 0.005 %  ophthalmic solution Place 1 drop into both eyes at bedtime.       [name] losartan (COZAAR) 50 MG tablet Take 50 mg by mouth every morning.      . metoprolol succinate (TOPROL-XL) 50 MG 24 hr tablet Take 50 mg by mouth every evening.      . Multiple Vitamin (MULTIVITAMIN) tablet Take 1 tablet by mouth daily.       . nitrofurantoin (MACRODANTIN) 100 MG capsule Take 100 mg by mouth 4 (four) times daily.      . nitroGLYCERIN (NITROSTAT) 0.4 MG SL tablet Place 1 tablet (0.4 mg total) under the tongue every 5 (five) minutes as needed for chest pain.  100 tablet  3  . omeprazole (PRILOSEC) 20 MG capsule Take 20 mg by mouth every morning.       [name] PRESCRIPTION MEDICATION Place 1 spray into the nose daily. Q- NASL  NASAL SPRAY DAILY      . simvastatin (ZOCOR) 40 MG tablet       . fluticasone (FLONASE) 50 MCG/ACT nasal spray Place 2 sprays into the nose daily.      [name] LORazepam (ATIVAN) 1 MG tablet Take 1 mg by mouth every 8 (eight) hours as needed.        No results found for this or any previous visit (from the past 48 hour(s)). No results found.  Review of Systems - Negative except Slow urinary stream, Fatigue, itching, sinus problems,Back pain, joint pain, depression.  Height 5' 10.5" (1.791 m), weight 95.255 kg (210 lb). General appearance: alert, cooperative and no distress Neck: no adenopathy and no JVD Resp: clear to auscultation bilaterally Cardio: regular rate and rhythm GI: soft, non-tender; bowel sounds normal; no masses,  no organomegaly Male genitalia: normal, penis: no lesions or discharge. testes: no masses or tenderness. no hernias Extremities: extremities normal, atraumatic, no cyanosis or edema Skin: Skin color, texture, turgor normal. No rashes or lesions Neurologic: Grossly normal  Assessment/Plan Urethral stricture disease/bladder neck contracture status post robotic prostatectomy.   From a cancer standpoint I am certainly encouraged with what we are seeing [name]. Again,  the patient did have a positive margin but his PSA has continued to be undetectable now 5 years out, which is extremely encouraging. [name] has had a recent episode of cystitis. His urine is clear today. His ultrasound, however, showed close to a 400 cc residual and it is extremely likely that his infection was secondary to incomplete bladder emptying. He has had more leakage, especially with activity, suggesting an overflow phenomenon. Cystoscopically today he clearly has a recurrent bladder contracture. It is not pinhole but I would estimate it at no bigger than about 8 [place]. This should be treated. I  would recommend the least traumatic way possible. I would suggest that we start with a balloon dilation in the operating room. If that results in sufficient opening, I would leave it there. If not, we can consider a limited Holmium laser of part of the scar as well. I would anticipate about a 30 minute procedure. This should be able to be done as an outpatient. At the time of surgery we will have to determine whether we think he would benefit from leaving an indwelling catheter in for a day or two. I would like to keep him on a daily antibiotic until the time of the procedure. He understands that this has an excellent chance of improving urinary stream and bladder emptying, but will have a more uncertain result with regard to any leakage issues. I do think the overflow situation would improve dramatically, but it is always difficult to know how the stress incontinence will respond to these types of procedures. He understands that recurrence of stricturing/contractures is very possible.  Aven Cegielski S 04/29/2011, 5:38 PM

## 2011-04-29 NOTE — Telephone Encounter (Signed)
New msg Pt said he is having urology surgery tomorrow. He needs surgical clearance Please call

## 2011-04-29 NOTE — Telephone Encounter (Signed)
Patient called was told surgical clearance already faxed to [facility].

## 2011-04-30 ENCOUNTER — Ambulatory Visit (HOSPITAL_BASED_OUTPATIENT_CLINIC_OR_DEPARTMENT_OTHER): Payer: Medicare Other | Admitting: Anesthesiology

## 2011-04-30 ENCOUNTER — Encounter (HOSPITAL_BASED_OUTPATIENT_CLINIC_OR_DEPARTMENT_OTHER): Payer: Self-pay | Admitting: Anesthesiology

## 2011-04-30 ENCOUNTER — Ambulatory Visit (HOSPITAL_BASED_OUTPATIENT_CLINIC_OR_DEPARTMENT_OTHER)
Admission: RE | Admit: 2011-04-30 | Discharge: 2011-04-30 | Disposition: A | Discharge status: Home / Self Care | Payer: Medicare Other | Source: Ambulatory Visit | Attending: Urology | Admitting: Urology

## 2011-04-30 ENCOUNTER — Encounter (HOSPITAL_BASED_OUTPATIENT_CLINIC_OR_DEPARTMENT_OTHER): Payer: Self-pay | Admitting: *Deleted

## 2011-04-30 ENCOUNTER — Encounter (HOSPITAL_BASED_OUTPATIENT_CLINIC_OR_DEPARTMENT_OTHER)
Admission: RE | Disposition: A | Discharge status: Home / Self Care | Payer: Self-pay | Source: Ambulatory Visit | Attending: Urology

## 2011-04-30 DIAGNOSIS — I10 Essential (primary) hypertension: Secondary | ICD-10-CM | POA: Insufficient documentation

## 2011-04-30 DIAGNOSIS — Z951 Presence of aortocoronary bypass graft: Secondary | ICD-10-CM | POA: Insufficient documentation

## 2011-04-30 DIAGNOSIS — Z9079 Acquired absence of other genital organ(s): Secondary | ICD-10-CM | POA: Insufficient documentation

## 2011-04-30 DIAGNOSIS — N320 Bladder-neck obstruction: Secondary | ICD-10-CM

## 2011-04-30 DIAGNOSIS — Z7982 Long term (current) use of aspirin: Secondary | ICD-10-CM | POA: Insufficient documentation

## 2011-04-30 DIAGNOSIS — E785 Hyperlipidemia, unspecified: Secondary | ICD-10-CM | POA: Insufficient documentation

## 2011-04-30 DIAGNOSIS — K219 Gastro-esophageal reflux disease without esophagitis: Secondary | ICD-10-CM | POA: Insufficient documentation

## 2011-04-30 DIAGNOSIS — Z8546 Personal history of malignant neoplasm of prostate: Secondary | ICD-10-CM | POA: Insufficient documentation

## 2011-04-30 DIAGNOSIS — I252 Old myocardial infarction: Secondary | ICD-10-CM | POA: Insufficient documentation

## 2011-04-30 DIAGNOSIS — I2510 Atherosclerotic heart disease of native coronary artery without angina pectoris: Secondary | ICD-10-CM | POA: Insufficient documentation

## 2011-04-30 DIAGNOSIS — Z79899 Other long term (current) drug therapy: Secondary | ICD-10-CM | POA: Insufficient documentation

## 2011-04-30 HISTORY — DX: Unspecified cataract: H26.9

## 2011-04-30 HISTORY — DX: Personal history of urinary calculi: Z87.442

## 2011-04-30 HISTORY — PX: BALLOON DILATION: SHX5330

## 2011-04-30 HISTORY — DX: Other forms of angina pectoris: I20.89

## 2011-04-30 HISTORY — DX: Personal history of other diseases of the digestive system: Z87.19

## 2011-04-30 HISTORY — DX: Old myocardial infarction: I25.2

## 2011-04-30 HISTORY — DX: Nocturia: R35.1

## 2011-04-30 HISTORY — PX: CYSTOSCOPY: SHX5120

## 2011-04-30 HISTORY — DX: Urgency of urination: R39.15

## 2011-04-30 HISTORY — DX: Personal history of malignant neoplasm of prostate: Z85.46

## 2011-04-30 HISTORY — DX: Bladder-neck obstruction: N32.0

## 2011-04-30 HISTORY — DX: Other forms of angina pectoris: I20.8

## 2011-04-30 HISTORY — DX: Presence of aortocoronary bypass graft: Z95.1

## 2011-04-30 HISTORY — DX: Unspecified osteoarthritis, unspecified site: M19.90

## 2011-04-30 HISTORY — DX: Frequency of micturition: R35.0

## 2011-04-30 LAB — POCT I-STAT 4, (NA,K, GLUC, HGB,HCT)
Glucose, Bld: 129 mg/dL — ABNORMAL HIGH (ref 70–99)
HCT: 39 % (ref 39.0–52.0)
Hemoglobin: 13.3 g/dL (ref 13.0–17.0)

## 2011-04-30 SURGERY — CYSTOSCOPY
Anesthesia: General

## 2011-04-30 MED ORDER — CIPROFLOXACIN IN D5W 400 MG/200ML IV SOLN
400.0000 mg | INTRAVENOUS | Status: AC
  Administered 2011-04-30: 400 mg via INTRAVENOUS

## 2011-04-30 MED ORDER — BELLADONNA ALKALOIDS-OPIUM 16.2-60 MG RE SUPP
RECTAL | Status: DC | PRN
  Administered 2011-04-30: 1 via RECTAL

## 2011-04-30 MED ORDER — LACTATED RINGERS IV SOLN
INTRAVENOUS | Status: DC | PRN
  Administered 2011-04-30: 10:00:00 via INTRAVENOUS

## 2011-04-30 MED ORDER — LIDOCAINE HCL 2 % EX GEL
CUTANEOUS | Status: DC | PRN
  Administered 2011-04-30: 1

## 2011-04-30 MED ORDER — PROPOFOL 10 MG/ML IV EMUL
INTRAVENOUS | Status: DC | PRN
  Administered 2011-04-30: 200 mg via INTRAVENOUS

## 2011-04-30 MED ORDER — FENTANYL CITRATE 0.05 MG/ML IJ SOLN: 25.0000 ug | INTRAMUSCULAR | Status: DC | PRN

## 2011-04-30 MED ORDER — FENTANYL CITRATE 0.05 MG/ML IJ SOLN
INTRAMUSCULAR | Status: DC | PRN
  Administered 2011-04-30 (×2): 50 ug via INTRAVENOUS

## 2011-04-30 MED ORDER — SODIUM CHLORIDE 0.9 % IR SOLN
Status: DC | PRN
  Administered 2011-04-30: 3000 mL

## 2011-04-30 MED ORDER — ONDANSETRON HCL 4 MG/2ML IJ SOLN
INTRAMUSCULAR | Status: DC | PRN
  Administered 2011-04-30: 4 mg via INTRAVENOUS

## 2011-04-30 MED ORDER — MIDAZOLAM HCL 5 MG/5ML IJ SOLN
INTRAMUSCULAR | Status: DC | PRN
  Administered 2011-04-30: 2 mg via INTRAVENOUS

## 2011-04-30 MED ORDER — IOHEXOL 350 MG/ML SOLN
INTRAVENOUS | Status: DC | PRN
  Administered 2011-04-30: 50 mL via INTRAVENOUS

## 2011-04-30 MED ORDER — DEXAMETHASONE SODIUM PHOSPHATE 4 MG/ML IJ SOLN
INTRAMUSCULAR | Status: DC | PRN
  Administered 2011-04-30: 4 mg via INTRAVENOUS

## 2011-04-30 MED ORDER — KETOROLAC TROMETHAMINE 30 MG/ML IJ SOLN
INTRAMUSCULAR | Status: DC | PRN
  Administered 2011-04-30: 30 mg via INTRAVENOUS

## 2011-04-30 MED ORDER — HYDROCODONE-ACETAMINOPHEN 5-325 MG PO TABS: 1.0000 | ORAL_TABLET | Freq: Four times a day (QID) | ORAL | Status: AC | PRN

## 2011-04-30 MED ORDER — LACTATED RINGERS IV SOLN
INTRAVENOUS | Status: DC
  Administered 2011-04-30: 100 mL/h via INTRAVENOUS
  Administered 2011-04-30: 13:00:00 via INTRAVENOUS

## 2011-04-30 MED ORDER — LIDOCAINE HCL (CARDIAC) 20 MG/ML IV SOLN
INTRAVENOUS | Status: DC | PRN
  Administered 2011-04-30: 100 mg via INTRAVENOUS

## 2011-04-30 SURGICAL SUPPLY — 23 items
BAG DRAIN URO-CYSTO SKYTR STRL (DRAIN) ×2 IMPLANT
BAG DRN UROCATH (DRAIN) ×1
BALLN NEPHROSTOMY (BALLOONS) ×2
BALLOON NEPHROSTOMY (BALLOONS) IMPLANT
CANISTER SUCT LVC 12 LTR MEDI- (MISCELLANEOUS) ×1 IMPLANT
CATH ROBINSON RED A/P 14FR (CATHETERS) IMPLANT
CATH ROBINSON RED A/P 16FR (CATHETERS) IMPLANT
CLOTH BEACON ORANGE TIMEOUT ST (SAFETY) ×2 IMPLANT
DRAPE CAMERA CLOSED 9X96 (DRAPES) ×2 IMPLANT
ELECT REM PT RETURN 9FT ADLT (ELECTROSURGICAL) ×2
ELECTRODE REM PT RTRN 9FT ADLT (ELECTROSURGICAL) ×1 IMPLANT
GLOVE BIO SURGEON STRL SZ7.5 (GLOVE) ×2 IMPLANT
GLOVE INDICATOR 6.5 STRL GRN (GLOVE) ×1 IMPLANT
GOWN STRL REIN XL XLG (GOWN DISPOSABLE) ×2 IMPLANT
GOWN SURGICAL LARGE (GOWNS) ×1 IMPLANT
NDL SAFETY ECLIPSE 18X1.5 (NEEDLE) IMPLANT
NEEDLE HYPO 18GX1.5 SHARP (NEEDLE)
NEEDLE HYPO 22GX1.5 SAFETY (NEEDLE) IMPLANT
NS IRRIG 500ML POUR BTL (IV SOLUTION) IMPLANT
PACK CYSTOSCOPY (CUSTOM PROCEDURE TRAY) ×2 IMPLANT
SYR 20CC LL (SYRINGE) ×1 IMPLANT
SYR BULB IRRIGATION 50ML (SYRINGE) IMPLANT
WATER STERILE IRR 3000ML UROMA (IV SOLUTION) ×2 IMPLANT

## 2011-04-30 NOTE — Anesthesia Procedure Notes (Signed)
Procedure Name: LMA Insertion Date/Time: 04/30/2011 11:35 AM Performed by: Jessica Priest Pre-anesthesia Checklist: Patient identified, Emergency Drugs available, Suction available and Patient being monitored Patient Re-evaluated:Patient Re-evaluated prior to inductionOxygen Delivery Method: Circle System Utilized Preoxygenation: Pre-oxygenation with 100% oxygen Intubation Type: IV induction Ventilation: Mask ventilation without difficulty LMA: LMA inserted LMA Size: 4.0 Number of attempts: 1 Airway Equipment and Method: bite block Placement Confirmation: positive ETCO2 Tube secured with: Tape Dental Injury: Teeth and Oropharynx as per pre-operative assessment

## 2011-04-30 NOTE — Anesthesia Postprocedure Evaluation (Signed)
  Anesthesia Post-op Note  Patient: [name]  Procedure(s) Performed: Procedure(s) (LRB): CYSTOSCOPY (N/A) BALLOON DILATION (N/A)  Patient Location: PACU  Anesthesia Type: General  Level of Consciousness: oriented and sedated  Airway and Oxygen Therapy: Patient Spontanous Breathing  Post-op Pain: mild  Post-op Assessment: Post-op Vital signs reviewed, Patient's Cardiovascular Status Stable, Respiratory Function Stable and Patent Airway  Post-op Vital Signs: stable  Complications: No apparent anesthesia complications

## 2011-04-30 NOTE — Discharge Instructions (Signed)

## 2011-04-30 NOTE — Progress Notes (Addendum)
Pt questioning macrodantin dose.  States he takes 100mg  x1 @ home.  Instructions read 4x/day.  Dr. Isabel Caprice paged via beeper.  Pt ready to go home so I talked w Selena Batten in the office.  Dr. Isabel Caprice isn't available at this time.  Selena Batten will clarify and call pt at home in the am. "  Pt called and message left on voicemail for him.

## 2011-04-30 NOTE — Anesthesia Preprocedure Evaluation (Signed)
Anesthesia Evaluation  Patient identified by MRN, date of birth, ID band Patient awake    Reviewed: Allergy & Precautions, H&P , NPO status , Patient's Chart, lab work & pertinent test results, reviewed documented beta blocker date and time   Airway Mallampati: II TM Distance: >3 FB Neck ROM: Full    Dental  (+) Teeth Intact and Dental Advisory Given   Pulmonary neg pulmonary ROS,  breath sounds clear to auscultation        Cardiovascular hypertension, Pt. on medications + CAD Rhythm:Regular Rate:Normal  CAD, s/p CABG 2006 Cath 2008 2 occluded grafts, medical management Rare CP Given cardiology clearance   Neuro/Psych negative neurological ROS  negative psych ROS   GI/Hepatic negative GI ROS, Neg liver ROS,   Endo/Other  negative endocrine ROS  Renal/GU negative Renal ROS   Bladder neck contracture Hx prostate cancer    Musculoskeletal negative musculoskeletal ROS (+)   Abdominal   Peds negative pediatric ROS (+)  Hematology negative hematology ROS (+)   Anesthesia Other Findings   Reproductive/Obstetrics negative OB ROS                           Anesthesia Physical Anesthesia Plan  ASA: III  Anesthesia Plan: General   Post-op Pain Management:    Induction: Intravenous  Airway Management Planned: LMA  Additional Equipment:   Intra-op Plan:   Post-operative Plan: Extubation in OR  Informed Consent: I have reviewed the patients History and Physical, chart, labs and discussed the procedure including the risks, benefits and alternatives for the proposed anesthesia with the patient or authorized representative who has indicated his/her understanding and acceptance.   Dental advisory given  Plan Discussed with: CRNA and Surgeon  Anesthesia Plan Comments:         Anesthesia Quick Evaluation

## 2011-04-30 NOTE — Op Note (Signed)
Preoperative diagnosis: Bladder neck contracture status post radical prostatectomy. Postoperative diagnosis: Same  Procedure: Cystoscopy with balloon dilation of bladder neck contracture.   Surgeon: Valetta Fuller M.D.  Anesthesia: Gen.  Indications: [name] is 77 years of age. He is approximately 5 years status post a prostatectomy for prostate cancer. PSA has been undetectable and he is no sign of recurrent disease. The patient recently had an episode of acute cystitis. Also residual was found to be close to 400 cc used. He had reported some weakening of his urinary stream and on cystoscopy in the office clearly had evidence of a recurrent bladder neck contracture. We recommended that we go ahead with an additional dilation. We discussed the pros and cons of this with them. He understands a stress leakage may worsen.     Technique and findings: Patient was brought to the operating room or in successful induction of general anesthesia. Patient was lithotomy position prepped and draped in usual manner. The patient had placement of PAS compression boots. Appropriate surgical timeout was performed. Cystoscopy revealed a bladder neck contracture of approximately 8 French size. Guidewire was placed through this. A 24 French fascial dilating balloon was then placed through this narrowed area with fluoroscopic guidance. This was inflated to proximally 16 atmospheres for 5 minutes. There was excellent dilation of the strictured area. I did not feel indwelling Foley catheter was necessary. Lidocaine jelly was instilled per urethra. The patient's right recovery room having had no obvious complications.

## 2011-04-30 NOTE — Interval H&P Note (Signed)
History and Physical Interval Note:  04/30/2011 11:20 AM  [name]  has presented today for surgery, with the diagnosis of BLADDER NECK CONTRACTURE  The various methods of treatment have been discussed with the patient and family. After consideration of risks, benefits and other options for treatment, the patient has consented to  Procedure(s) (LRB): CYSTOSCOPY (N/A) BALLOON DILATION (N/A) HOLMIUM LASER APPLICATION (N/A) as a surgical intervention .  The patients' history has been reviewed, patient examined, no change in status, stable for surgery.  I have reviewed the patients' chart and labs.  Questions were answered to the patient's satisfaction.     [name]

## 2011-04-30 NOTE — Transfer of Care (Signed)
Immediate Anesthesia Transfer of Care Note  Patient: [name]  Procedure(s) Performed: Procedure(s) (LRB): CYSTOSCOPY (N/A) BALLOON DILATION (N/A)  Patient Location: PACU  Anesthesia Type: General  Level of Consciousness: awake, sedated, patient cooperative and responds to stimulation  Airway & Oxygen Therapy: Patient Spontanous Breathing and Patient connected to face mask oxygen  Post-op Assessment: Report given to PACU RN, Post -op Vital signs reviewed and stable and Patient moving all extremities  Post vital signs: Reviewed and stable  Complications: No apparent anesthesia complications

## 2011-05-01 ENCOUNTER — Encounter (HOSPITAL_BASED_OUTPATIENT_CLINIC_OR_DEPARTMENT_OTHER): Payer: Self-pay | Admitting: Urology

## 2011-06-10 ENCOUNTER — Other Ambulatory Visit: Payer: Self-pay | Admitting: Cardiology

## 2011-06-17 ENCOUNTER — Ambulatory Visit (INDEPENDENT_AMBULATORY_CARE_PROVIDER_SITE_OTHER): Payer: Medicare Other | Admitting: Cardiology

## 2011-06-17 ENCOUNTER — Encounter: Payer: Self-pay | Admitting: Cardiology

## 2011-06-17 VITALS — BP 134/72 | HR 60 | Ht 70.0 in | Wt 215.0 lb

## 2011-06-17 DIAGNOSIS — E785 Hyperlipidemia, unspecified: Secondary | ICD-10-CM

## 2011-06-17 DIAGNOSIS — I10 Essential (primary) hypertension: Secondary | ICD-10-CM

## 2011-06-17 DIAGNOSIS — I2510 Atherosclerotic heart disease of native coronary artery without angina pectoris: Secondary | ICD-10-CM

## 2011-06-17 DIAGNOSIS — E7800 Pure hypercholesterolemia, unspecified: Secondary | ICD-10-CM

## 2011-06-17 MED ORDER — METOPROLOL SUCCINATE ER 50 MG PO TB24: 50.0000 mg | ORAL_TABLET | Freq: Every evening | ORAL | Status: DC

## 2011-06-17 NOTE — Patient Instructions (Signed)
Continue your medication  Stay active  We will schedule you for fasting lab work

## 2011-06-18 NOTE — Assessment & Plan Note (Signed)
Blood pressure is well controlled today. We'll continue his current therapy.

## 2011-06-18 NOTE — Assessment & Plan Note (Signed)
There has been no change in his chest pain pattern which is atypical for angina. We will continue with his current medical therapy. This includes aspirin, isosorbide, and metoprolol. 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

## 2011-06-18 NOTE — Assessment & Plan Note (Signed)
We will followup fasting lab work including complete chemistry panel and lipid panel. Since he is having some muscle cramping we will also check a CPK. For now we will continue with his statin therapy.

## 2011-06-18 NOTE — Progress Notes (Signed)
Darl Pikes Date of Birth: 08/03/1934   History of Present Illness: Mr. Catanzaro is seen today for followup. In general he has been doing well. He has started exercising more. He admits that he hasn't been as active. He denies any change in his chest pain symptoms. He still gets occasional chest pain is described as mild and is nonexertional. He occasionally gets cramps in his arms, legs, and neck. He denies any true myalgias.  Current Outpatient Prescriptions on File Prior to Visit  Medication Sig Dispense Refill  . aspirin 81 MG tablet Take 81 mg by mouth daily.       . isosorbide mononitrate (IMDUR) 60 MG 24 hr tablet TAKE 1 TABLET (60 MG TOTAL) BY MOUTH DAILY.  90 tablet  0  . latanoprost (XALATAN) 0.005 % ophthalmic solution Place 1 drop into both eyes at bedtime.       [name] losartan (COZAAR) 50 MG tablet Take 50 mg by mouth every morning.      . metoprolol succinate (TOPROL-XL) 50 MG 24 hr tablet Take 1 tablet (50 mg total) by mouth every evening.  90 tablet  3  . Multiple Vitamin (MULTIVITAMIN) tablet Take 1 tablet by mouth daily.       . nitroGLYCERIN (NITROSTAT) 0.4 MG SL tablet Place 1 tablet (0.4 mg total) under the tongue every 5 (five) minutes as needed for chest pain.  100 tablet  3  . omeprazole (PRILOSEC) 20 MG capsule Take 20 mg by mouth every morning.       [name] PRESCRIPTION MEDICATION Place 1 spray into the nose daily. Q- NASL  NASAL SPRAY DAILY      . simvastatin (ZOCOR) 40 MG tablet Take 40 mg by mouth daily.         Allergies  Allergen Reactions  . Clindamycin Other (See Comments)    UNKNOWN    Past Medical History  Diagnosis Date  . GERD (gastroesophageal reflux disease)   . Hypertension   . Hyperlipidemia   . Insomnia   . Hypertriglyceridemia   . History of atherosclerotic cardiovascular disease   . Bladder neck contracture   . Stable angina   . History of myocardial infarction 2006   S/P CABG  . S/P CABG x 4 2006  . Coronary artery disease  CARDIOLOGIST- DR [place]-  LAST VISIT NOTE 12-20-2010 IN EPIC AND W/ CHART    subendocranial MI; left inter. mamm to LAD, saph vein to diag; saph vein to distal circ; saph vein to 1st obtuse  . Arthritis   . H/O hiatal hernia   . History of kidney stones   . Glaucoma BILATERAL  . Cataract immature BILATERAL  . Frequency of urination   . Urgency of urination   . Nocturia   . History of prostate cancer S/P PROSTATECTOMY-  NO RECURRENCE    FOLLOWED BY DR Isabel Caprice    Past Surgical History  Procedure Date  . Debridement tennis elbow 2002    RIGHT  . Robot assisted laparoscopic radical prostatectomy 05-11-2006  . Cardiac catheterization 07-30-2006  DR [place]    POST CABG / OCCLUDED DIAGONAL &  FOURTH OBTUSE MARGINAL GRAFTS/ NORMAL LVF/ PATENT LIMA TO LAD & SEPHENOUS VEIN GRAFT TO 1ST OBTUSE MARGINAL   . Cardiac catheterization 04-23-2004    CRITICAL THREE-VESSEL CAD/ PRESERVED LVF  . Laparoscopic cholecystectomy 02-05-2005  . Coronary artery bypass graft 04-25-2004  X4 VESSELS    left inter. mamm to LAD, saph vein to diag; saph vein to distal  circ; saph vein to 1st obtuse  . Cystoscopy 04/30/2011    Procedure: CYSTOSCOPY;  Surgeon: Valetta Fuller, MD;  Location: [facility];  Service: Urology;  Laterality: N/A;  30 MINUTES CYSTO, BALLOON DILATION OF BLADDER NECK CONTRACTURE  . Balloon dilation 04/30/2011    Procedure: BALLOON DILATION;  Surgeon: Valetta Fuller, MD;  Location: [facility];  Service: Urology;  Laterality: N/A;    History  Smoking status  . Former Smoker -- 3 years  . Types: Cigarettes, Cigars  Smokeless tobacco  . Never Used  Comment: QUIT SMOKING CIGARETTES 30 YRS AGO AND QUIT OCCASIONAL CIGAR USE 2006    History  Alcohol Use  . 8.4 oz/week  . 7 Glasses of wine, 7 Shots of liquor per week    Family History  Problem Relation Age of Onset  . Heart disease Sister     CABGX 1 sister and stents X1 sister  . Parkinsonism Father      Review of Systems: As noted in history of present illness. All other systems were reviewed and are negative.  Physical Exam: BP 134/72  Pulse 60  Ht 5\' 10"  (1.778 m)  Wt 215 lb (97.523 kg)  BMI 30.85 kg/m2 He is an overweight white male in no acute distress. HEENT normocephalic, atraumatic. Pupils are equal round and reactive to light. Oropharynx is clear. Neck is supple without JVD, adenopathy, thyromegaly, or bruits. Lungs are clear. Cardiac exam reveals a regular rate and rhythm without gallop or murmur. His abdomen is soft and nontender. He has no edema. Pedal pulses are good. LABORATORY DATA:  ECG shows sinus bradycardia with first-degree AV block. He has voltage criteria for LVH with left axis deviation. Assessment / Plan:

## 2011-06-19 ENCOUNTER — Other Ambulatory Visit (INDEPENDENT_AMBULATORY_CARE_PROVIDER_SITE_OTHER): Payer: Medicare Other

## 2011-06-19 DIAGNOSIS — I10 Essential (primary) hypertension: Secondary | ICD-10-CM

## 2011-06-19 DIAGNOSIS — E7800 Pure hypercholesterolemia, unspecified: Secondary | ICD-10-CM

## 2011-06-19 DIAGNOSIS — I2510 Atherosclerotic heart disease of native coronary artery without angina pectoris: Secondary | ICD-10-CM

## 2011-06-19 LAB — BASIC METABOLIC PANEL
Calcium: 8.8 mg/dL (ref 8.4–10.5)
Creatinine, Ser: 1 mg/dL (ref 0.4–1.5)
GFR: 73.96 mL/min (ref >60.00)

## 2011-06-19 LAB — LIPID PANEL
HDL: 41.3 mg/dL (ref >39.00)
LDL Cholesterol: 41 mg/dL (ref 0–99)
Total CHOL/HDL Ratio: 2
Triglycerides: 94 mg/dL (ref 0.0–149.0)

## 2011-06-19 LAB — HEPATIC FUNCTION PANEL
Bilirubin, Direct: 0.1 mg/dL (ref 0.0–0.3)
Total Bilirubin: 0.8 mg/dL (ref 0.3–1.2)

## 2011-08-13 ENCOUNTER — Encounter: Payer: Self-pay | Admitting: Internal Medicine

## 2011-08-13 ENCOUNTER — Ambulatory Visit (INDEPENDENT_AMBULATORY_CARE_PROVIDER_SITE_OTHER): Payer: Medicare Other | Admitting: Internal Medicine

## 2011-08-13 VITALS — BP 114/62 | HR 63 | Temp 98.1°F | Wt 218.0 lb

## 2011-08-13 DIAGNOSIS — R252 Cramp and spasm: Secondary | ICD-10-CM

## 2011-08-13 DIAGNOSIS — IMO0001 Reserved for inherently not codable concepts without codable children: Secondary | ICD-10-CM

## 2011-08-13 DIAGNOSIS — G250 Essential tremor: Secondary | ICD-10-CM

## 2011-08-13 DIAGNOSIS — R7309 Other abnormal glucose: Secondary | ICD-10-CM

## 2011-08-13 DIAGNOSIS — G252 Other specified forms of tremor: Secondary | ICD-10-CM

## 2011-08-13 DIAGNOSIS — M791 Myalgia, unspecified site: Secondary | ICD-10-CM

## 2011-08-13 MED ORDER — TRAMADOL HCL 50 MG PO TABS: 50.0000 mg | ORAL_TABLET | Freq: Four times a day (QID) | ORAL | Status: AC | PRN

## 2011-08-13 NOTE — Patient Instructions (Addendum)
There is  minimal reduction in Alkaline Phosphatase.Dietary sources of  Alk Phos include whole grains & nuts. Alk Phos is important for optimal liver & bone health.      Repeat the isometric exercises discussed 4- 5 times prior to standing if you've been seated for a period of time.  To prevent tremor, avoid stimulants such as decongestants, diet pills, nicotine, or caffeine (coffee, tea, cola, or chocolate) to excess.  Please try to go on My Chart within the next 24 hours to allow me to release the results directly to you.

## 2011-08-13 NOTE — Progress Notes (Signed)
Subjective:    Patient ID: [name], male    DOB: [birth year], 77 y.o.   MRN: [id number]  HPI He has several concerns; his major concern is cramps. For the last few weeks he's had cramps in his legs which will awaken him. He states he has no difficulty going to sleep but is awakened by the cramps and then will have trouble  going back to sleep  He denies claudication symptoms. He's also had cramps in shoulders and neck area for 2-3 months.  He was on simvastatin 40 mg daily until one month ago. Because of the cramping his cardiologist decreased this to one half pill daily. The cramping in his legs has actually gotten worse despite this intervention. The shoulder & neck cramps may have improved with the decreased dose of statin  His last LDL was 41 on 06/19/11; his AST and ALT were normal at that time.  On the same date his potassium and calcium were normal. Alkaline phosphatase was mildly reduced at 37.  He describes a tremor in his RUE with it outstretched  for the last year. He is right-handed. He does not notice a tremor on the left. He is on metoprolol ,a beta selective agent. He is unfamiliar with any family history of tremor    Review of Systems Labs also revealed glucoses ranging from 122-129. He denies polyphagia, polydipsia, polyuria.       Objective:   Physical Exam Gen.: adequately nourished in appearance. Alert, appropriate and cooperative throughout exam.  Eyes: No corneal or conjunctival inflammation noted. Arcus present. Extraocular motion intact.No lid lag or proptosis Neck: No deformities, masses, or tenderness noted. Range of motion decreased. Thyroid normal. Lungs: Normal respiratory effort; chest expands symmetrically. Lungs are clear to auscultation without rales, wheezes, or increased work of breathing. Heart: Normal rate and rhythm. Normal S1 and S2. No gallop, click, or rub. Grade 1/6 systolic murmur  Abdomen: Bowel sounds normal; abdomen soft and nontender.  No masses, organomegaly .Large ventral hernia noted.                                                                 Musculoskeletal/extremities:  No clubbing, cyanosis,  or deformity noted. Tone & strength  normal.Joints normal. Nail health good; no onycholysis. Trace-one half plus ankle edema at the sock line Vascular: Carotid, radial artery, dorsalis pedis and  posterior tibial pulses are full and equal. No bruits present. Neurologic: Alert and oriented x3. Deep tendon reflexes symmetrical and normal. No tremor at rest       Skin: Intact without suspicious lesions or rashes. Lymph: No cervical, axillary lymphadenopathy present. Psych: Mood and affect are normal. Normally interactive                                                                                        Assessment & Plan:  #1 nocturnal cramps; possible restless leg variant. Additionally he describes cramps  in shoulders and neck area despite reduction in his statin dose  #2 tremor, benign intention  #3 hyperglycemia  Plan: See labs and orders

## 2011-08-14 ENCOUNTER — Ambulatory Visit: Payer: Medicare Other | Admitting: Internal Medicine

## 2011-08-14 LAB — HEMOGLOBIN A1C: Hgb A1c MFr Bld: 6.8 % — ABNORMAL HIGH (ref 4.6–6.5)

## 2011-09-02 ENCOUNTER — Ambulatory Visit (INDEPENDENT_AMBULATORY_CARE_PROVIDER_SITE_OTHER): Payer: Medicare Other | Admitting: Internal Medicine

## 2011-09-02 VITALS — BP 132/80 | HR 62 | Temp 98.5°F | Resp 16 | Ht 71.0 in | Wt 210.0 lb

## 2011-09-02 DIAGNOSIS — J209 Acute bronchitis, unspecified: Secondary | ICD-10-CM

## 2011-09-02 DIAGNOSIS — J029 Acute pharyngitis, unspecified: Secondary | ICD-10-CM

## 2011-09-02 DIAGNOSIS — R05 Cough: Secondary | ICD-10-CM

## 2011-09-02 DIAGNOSIS — R059 Cough, unspecified: Secondary | ICD-10-CM

## 2011-09-02 MED ORDER — HYDROCODONE-ACETAMINOPHEN 7.5-500 MG/15ML PO SOLN: 5.0000 mL | Freq: Four times a day (QID) | ORAL | Status: AC | PRN

## 2011-09-02 MED ORDER — AZITHROMYCIN 500 MG PO TABS: 500.0000 mg | ORAL_TABLET | Freq: Every day | ORAL | Status: AC

## 2011-09-02 NOTE — Progress Notes (Signed)
  Subjective:    Patient ID: [name], male    DOB: 11/09/1934, 77 y.o.   MRN: [id number]  HPI Abrupt onset st and cough. No sob or cp. No smoke or asthma Feels feverish,rundown.   Review of Systems Heart disorder    Objective:   Physical Exam  Constitutional: He is oriented to person, place, and time. He appears well-nourished. No distress.  HENT:  Right Ear: External ear normal.  Left Ear: External ear normal.  Mouth/Throat: Oropharyngeal exudate present.  Eyes: EOM are normal.  Neck: Neck supple.  Cardiovascular: Normal rate and normal heart sounds.   Pulmonary/Chest: Effort normal. He has wheezes.  Lymphadenopathy:    He has cervical adenopathy.  Neurological: He is alert and oriented to person, place, and time.  Skin: Skin is warm and dry.   rst       Assessment & Plan:  Pharyngitis /bronchitis

## 2011-09-02 NOTE — Patient Instructions (Addendum)
Laryngitis At the top of your windpipe is your voice box. It is the source of your voice. Inside your voice box are 2 bands of muscles called vocal cords. When you breathe, your vocal cords are relaxed and open so that air can get into the lungs. When you decide to say something, these cords come together and vibrate. The sound from these vibrations goes into your throat and comes out through your mouth as sound. Laryngitis is an inflammation of the vocal cords that causes hoarseness, cough, loss of voice, sore throat, and dry throat. Laryngitis can be temporary (acute) or long-term (chronic). Most cases of acute laryngitis improve with time.Chronic laryngitis lasts for more than 3 weeks. CAUSES Laryngitis can often be related to excessive smoking, talking, or yelling, as well as inhalation of toxic fumes and allergies. Acute laryngitis is usually caused by a viral infection, vocal strain, measles or mumps, or bacterial infections. Chronic laryngitis is usually caused by vocal cord strain, vocal cord injury, postnasal drip, growths on the vocal cords, or acid reflux. SYMPTOMS   Cough.   Sore throat.   Dry throat.  RISK FACTORS  Respiratory infections.   Exposure to irritating substances, such as cigarette smoke, excessive amounts of alcohol, stomach acids, and workplace chemicals.   Voice trauma, such as vocal cord injury from shouting or speaking too loud.  DIAGNOSIS  Your cargiver will perform a physical exam. During the physical exam, your caregiver will examine your throat. The most common sign of laryngitis is hoarseness. Laryngoscopy may be necessary to confirm the diagnosis of this condition. This procedure allows your caregiver to look into the larynx. HOME CARE INSTRUCTIONS  Drink enough fluids to keep your urine clear or pale yellow.   Rest until you no longer have symptoms or as directed by your caregiver.   Breathe in moist air.   Take all medicine as directed by your  caregiver.   Do not smoke.   Talk as little as possible (this includes whispering).   Write on paper instead of talking until your voice is back to normal.   Follow up with your caregiver if your condition has not improved after 10 days.  SEEK MEDICAL CARE IF:   You have trouble breathing.   You cough up blood.   You have persistent fever.   You have increasing pain.   You have difficulty swallowing.  MAKE SURE YOU:  Understand these instructions.   Will watch your condition.   Will get help right away if you are not doing well or get worse.  Document Released: 12/30/2004 Document Revised: 12/19/2010 Document Reviewed: 03/07/2010 [facility] Patient Information 2012 [place], [place].Pharyngitis, Viral and Bacterial Pharyngitis is soreness (inflammation) or infection of the pharynx. It is also called a sore throat. CAUSES  Most sore throats are caused by viruses and are part of a cold. However, some sore throats are caused by strep and other bacteria. Sore throats can also be caused by post nasal drip from draining sinuses, allergies and sometimes from sleeping with an open mouth. Infectious sore throats can be spread from person to person by coughing, sneezing and sharing cups or eating utensils. TREATMENT  Sore throats that are viral usually last 3-4 days. Viral illness will get better without medications (antibiotics). Strep throat and other bacterial infections will usually begin to get better about 24-48 hours after you begin to take antibiotics. HOME CARE INSTRUCTIONS   If the caregiver feels there is a bacterial infection or if there is a  positive strep test, they will prescribe an antibiotic. The full course of antibiotics must be taken. If the full course of antibiotic is not taken, you or your child may become ill again. If you or your child has strep throat and do not finish all of the medication, serious heart or kidney diseases may develop.   Drink enough water and  fluids to keep your urine clear or pale yellow.   Only take over-the-counter or prescription medicines for pain, discomfort or fever as directed by your caregiver.   Get lots of rest.   Gargle with salt water ( tsp. of salt in a glass of water) as often as every 1-2 hours as you need for comfort.   Hard candies may soothe the throat if individual is not at risk for choking. Throat sprays or lozenges may also be used.  SEEK MEDICAL CARE IF:   Large, tender lumps in the neck develop.   A rash develops.   Green, yellow-brown or bloody sputum is coughed up.   Your baby is older than 3 months with a rectal temperature of 100.5 F (38.1 C) or higher for more than 1 day.  SEEK IMMEDIATE MEDICAL CARE IF:   A stiff neck develops.   You or your child are drooling or unable to swallow liquids.   You or your child are vomiting, unable to keep medications or liquids down.   You or your child has severe pain, unrelieved with recommended medications.   You or your child are having difficulty breathing (not due to stuffy nose).   You or your child are unable to fully open your mouth.   You or your child develop redness, swelling, or severe pain anywhere on the neck.   You have a fever.   Your baby is older than 3 months with a rectal temperature of 102 F (38.9 C) or higher.   Your baby is 58 months old or younger with a rectal temperature of 100.4 F (38 C) or higher.  MAKE SURE YOU:   Understand these instructions.   Will watch your condition.   Will get help right away if you are not doing well or get worse.  Document Released: 12/30/2004 Document Revised: 12/19/2010 Document Reviewed: 03/29/2007 [facility] Patient Information 2012 [place], [place].Bronchitis Bronchitis is the body's way of reacting to injury and/or infection (inflammation) of the bronchi. Bronchi are the air tubes that extend from the windpipe into the lungs. If the inflammation becomes severe, it may cause  shortness of breath. CAUSES  Inflammation may be caused by:  A virus.   Germs (bacteria).   Dust.   Allergens.   Pollutants and many other irritants.  The cells lining the bronchial tree are covered with tiny hairs (cilia). These constantly beat upward, away from the lungs, toward the mouth. This keeps the lungs free of pollutants. When these cells become too irritated and are unable to do their job, mucus begins to develop. This causes the characteristic cough of bronchitis. The cough clears the lungs when the cilia are unable to do their job. Without either of these protective mechanisms, the mucus would settle in the lungs. Then you would develop pneumonia. Smoking is a common cause of bronchitis and can contribute to pneumonia. Stopping this habit is the single most important thing you can do to help yourself. TREATMENT   Your caregiver may prescribe an antibiotic if the cough is caused by bacteria. Also, medicines that open up your airways make it easier to breathe.  Your caregiver may also recommend or prescribe an expectorant. It will loosen the mucus to be coughed up. Only take over-the-counter or prescription medicines for pain, discomfort, or fever as directed by your caregiver.   Removing whatever causes the problem (smoking, for example) is critical to preventing the problem from getting worse.   Cough suppressants may be prescribed for relief of cough symptoms.   Inhaled medicines may be prescribed to help with symptoms now and to help prevent problems from returning.   For those with recurrent (chronic) bronchitis, there may be a need for steroid medicines.  SEEK IMMEDIATE MEDICAL CARE IF:   During treatment, you develop more pus-like mucus (purulent sputum).   You have a fever.   Your baby is older than 3 months with a rectal temperature of 102 F (38.9 C) or higher.   Your baby is 59 months old or younger with a rectal temperature of 100.4 F (38 C) or higher.    You become progressively more ill.   You have increased difficulty breathing, wheezing, or shortness of breath.  It is necessary to seek immediate medical care if you are elderly or sick from any other disease. MAKE SURE YOU:   Understand these instructions.   Will watch your condition.   Will get help right away if you are not doing well or get worse.  Document Released: 12/30/2004 Document Revised: 12/19/2010 Document Reviewed: 11/09/2007 [facility] Patient Information 2012 [place], [place].

## 2011-09-09 ENCOUNTER — Other Ambulatory Visit: Payer: Self-pay | Admitting: Cardiology

## 2011-09-09 DIAGNOSIS — I10 Essential (primary) hypertension: Secondary | ICD-10-CM

## 2011-09-09 MED ORDER — METOPROLOL SUCCINATE ER 50 MG PO TB24: 50.0000 mg | ORAL_TABLET | Freq: Every evening | ORAL | Status: DC

## 2011-09-09 MED ORDER — ISOSORBIDE MONONITRATE ER 60 MG PO TB24: 60.0000 mg | ORAL_TABLET | Freq: Every day | ORAL | Status: DC

## 2011-09-09 NOTE — Telephone Encounter (Signed)
Pt needs a 90 day supply sent in

## 2011-10-04 ENCOUNTER — Other Ambulatory Visit: Payer: Self-pay | Admitting: Cardiology

## 2011-10-12 ENCOUNTER — Other Ambulatory Visit: Payer: Self-pay | Admitting: Cardiology

## 2011-12-29 ENCOUNTER — Other Ambulatory Visit (INDEPENDENT_AMBULATORY_CARE_PROVIDER_SITE_OTHER): Payer: Medicare Other

## 2011-12-29 DIAGNOSIS — E119 Type 2 diabetes mellitus without complications: Secondary | ICD-10-CM

## 2011-12-29 LAB — HEMOGLOBIN A1C: Hgb A1c MFr Bld: 6.8 % — ABNORMAL HIGH (ref 4.6–6.5)

## 2012-01-19 ENCOUNTER — Ambulatory Visit: Payer: Medicare Other

## 2012-01-20 ENCOUNTER — Ambulatory Visit (INDEPENDENT_AMBULATORY_CARE_PROVIDER_SITE_OTHER): Payer: Medicare Other | Admitting: Internal Medicine

## 2012-01-20 ENCOUNTER — Encounter: Payer: Self-pay | Admitting: Internal Medicine

## 2012-01-20 VITALS — BP 112/70 | HR 62 | Temp 97.4°F | Resp 13 | Wt 203.0 lb

## 2012-01-20 DIAGNOSIS — J00 Acute nasopharyngitis [common cold]: Secondary | ICD-10-CM

## 2012-01-20 MED ORDER — FLUTICASONE PROPIONATE 50 MCG/ACT NA SUSP: 1.0000 | Freq: Two times a day (BID) | NASAL | Status: DC | PRN

## 2012-01-20 NOTE — Progress Notes (Signed)
  Subjective:    Patient ID: [name], male    DOB: [birth year], 77 y.o.   MRN: [id number]  HPI The respiratory tract symptoms began  6 weeks ago as malaise , rhinitis,  cough with white sputum.  Significant associated symptoms included frontal headache.  No fever  and sweats  present ; but he has had chilling   Extrinsic symptoms of   sneezing with profuse rhinitis began 01/17/12 .  Itchy , watery eyes  were not noted.  Treatment with  Neti pot & Tylenol resolved all symptoms except new nasal symptoms  There is no history of asthma. The patient  quit smoking in 2005                  Review of Systems Symptoms not present included  facial pain, dental pain, sore throat, nasal purulence, earache , and otic discharge Cough was not associated with shortness of breath or wheezing       Objective:   Physical Exam General appearance:good health ;well nourished; no acute distress or increased work of breathing is present.  No  lymphadenopathy about the head, neck, or axilla noted.  Eyes: No conjunctival inflammation or lid edema is present.  Ears:  External ear exam shows no significant lesions or deformities.  Otoscopic examination reveals wax on L Nose:  External nasal examination shows no deformity or inflammation. Nasal mucosa are eryhthematous without lesions or exudates. Septal deviation to R.No obstruction to airflow.  Oral exam: Dental hygiene is good; lips and gums are healthy appearing.There is no oropharyngeal erythema or exudate noted.  Neck:  No deformities, masses, or tenderness noted.   Heart:  Normal rate and regular rhythm. S1 and S2 normal without gallop, murmur, click, rub or other extra sounds.  Lungs:Chest clear to auscultation; no wheezes, rhonchi,rales ,or rubs present.No increased work of breathing.   Extremities:  No cyanosis, edema, or clubbing  noted  Skin: Warm & dry         Assessment & Plan:  #1 rhinitis Plan: See orders and  recommendations

## 2012-01-20 NOTE — Patient Instructions (Addendum)
Plain Mucinex for thick secretions ;force NON dairy fluids . Use a Neti pot daily as needed for sinus congestion; going from open side to congested side . Nasal cleansing in the shower as discussed. Make sure that all residual soap is removed to prevent irritation. Fluticasone 1 spray in each nostril twice a day as needed. Use the "crossover" technique as discussed. Plain Allegra 160 daily as needed for itchy eyes & sneezing. Repeat the isometric exercises discussed 4- 5 times prior to standing if you've been seated for a period of time.

## 2012-02-10 ENCOUNTER — Encounter: Payer: Self-pay | Admitting: Cardiology

## 2012-02-10 ENCOUNTER — Ambulatory Visit (INDEPENDENT_AMBULATORY_CARE_PROVIDER_SITE_OTHER): Payer: Medicare Other | Admitting: Cardiology

## 2012-02-10 VITALS — BP 133/83 | HR 57 | Ht 70.0 in | Wt 205.8 lb

## 2012-02-10 DIAGNOSIS — I10 Essential (primary) hypertension: Secondary | ICD-10-CM

## 2012-02-10 DIAGNOSIS — I7300 Raynaud's syndrome without gangrene: Secondary | ICD-10-CM | POA: Insufficient documentation

## 2012-02-10 DIAGNOSIS — E785 Hyperlipidemia, unspecified: Secondary | ICD-10-CM

## 2012-02-10 DIAGNOSIS — I2510 Atherosclerotic heart disease of native coronary artery without angina pectoris: Secondary | ICD-10-CM

## 2012-02-10 NOTE — Patient Instructions (Signed)
We will schedule you for fasting lab work and a nuclear stress test.  Keep your core body temperature warm.

## 2012-02-10 NOTE — Progress Notes (Signed)
[name] Date of Birth: 09/07/1934   History of Present Illness: Mr. Cardell is seen today for followup. He has a history of coronary disease and is status post CABG in 2006. It has been several years since his last stress test. On his last visit we reduced his Zocor dose because of complaints of muscle cramps. He does state that they have improved but he still has them to some degree. His CPK at that time was normal. It is interesting to note that this was repeated 6 weeks later and was elevated. More recently he has complained of cold intolerance with his fingertips turning white and then blue when exposed to cold. He does complain of some dizziness when he first stands up.  Current Outpatient Prescriptions on File Prior to Visit  Medication Sig Dispense Refill  . aspirin 81 MG tablet Take 81 mg by mouth daily.       . fluticasone (FLONASE) 50 MCG/ACT nasal spray Place 1 spray into the nose 2 (two) times daily as needed for rhinitis.  16 g  2  . isosorbide mononitrate (IMDUR) 60 MG 24 hr tablet Take 1 tablet (60 mg total) by mouth daily.  90 tablet  3  . latanoprost (XALATAN) 0.005 % ophthalmic solution Place 1 drop into both eyes at bedtime.       [name] losartan (COZAAR) 50 MG tablet Take 50 mg by mouth every morning.      . metoprolol succinate (TOPROL-XL) 50 MG 24 hr tablet Take 1 tablet (50 mg total) by mouth every evening.  90 tablet  3  . Multiple Vitamin (MULTIVITAMIN) tablet Take 1 tablet by mouth daily.       . nitroGLYCERIN (NITROSTAT) 0.4 MG SL tablet Place 1 tablet (0.4 mg total) under the tongue every 5 (five) minutes as needed for chest pain.  100 tablet  3  . omeprazole (PRILOSEC) 20 MG capsule Take 20 mg by mouth every morning.       . simvastatin (ZOCOR) 40 MG tablet Take 20 mg by mouth daily.         Allergies  Allergen Reactions  . Clindamycin Other (See Comments)    UNKNOWN    Past Medical History  Diagnosis Date  . GERD (gastroesophageal reflux disease)   .  Hypertension   . Hyperlipidemia   . Insomnia   . Hypertriglyceridemia   . History of atherosclerotic cardiovascular disease   . Bladder neck contracture   . Stable angina   . History of myocardial infarction 2006   S/P CABG  . S/P CABG x 4 2006  . Coronary artery disease CARDIOLOGIST- DR Swaziland-  LAST VISIT NOTE 12-20-2010 IN EPIC AND W/ CHART    subendocranial MI; left inter. mamm to LAD, saph vein to diag; saph vein to distal circ; saph vein to 1st obtuse  . Arthritis   . H/O hiatal hernia   . History of kidney stones   . Glaucoma(365) BILATERAL  . Cataract immature BILATERAL  . Frequency of urination   . Urgency of urination   . Nocturia   . History of prostate cancer S/P PROSTATECTOMY-  NO RECURRENCE    FOLLOWED BY DR Isabel Caprice  . Macular degeneration   . Raynaud's phenomenon     Past Surgical History  Procedure Date  . Debridement tennis elbow 2002    RIGHT  . Robot assisted laparoscopic radical prostatectomy 05-11-2006  . Cardiac catheterization 07-30-2006  DR Sw[place]and    POST CABG / OCCLUDED  DIAGONAL &  FOURTH OBTUSE MARGINAL GRAFTS/ NORMAL LVF/ PATENT LIMA TO LAD & SEPHENOUS VEIN GRAFT TO 1ST OBTUSE MARGINAL   . Cardiac catheterization 04-23-2004    CRITICAL THREE-VESSEL CAD/ PRESERVED LVF  . Laparoscopic cholecystectomy 02-05-2005  . Coronary artery bypass graft 04-25-2004  X4 VESSELS    left inter. mamm to LAD, saph vein to diag; saph vein to distal circ; saph vein to 1st obtuse  . Cystoscopy 04/30/2011    Procedure: CYSTOSCOPY;  Surgeon: Valetta Fuller, MD;  Location: [facility];  Service: Urology;  Laterality: N/A;  30 MINUTES CYSTO, BALLOON DILATION OF BLADDER NECK CONTRACTURE  . Balloon dilation 04/30/2011    Procedure: BALLOON DILATION;  Surgeon: Valetta Fuller, MD;  Location: [facility];  Service: Urology;  Laterality: N/A;    History  Smoking status  . Former Smoker -- 3 years  . Types: Cigarettes, Cigars  Smokeless tobacco  .  Never Used    Comment: QUIT SMOKING CIGARETTES 30 YRS AGO AND QUIT OCCASIONAL CIGAR USE 2006    History  Alcohol Use  . 8.4 oz/week  . 7 Glasses of wine, 7 Shots of liquor per week    Family History  Problem Relation Age of Onset  . Heart disease Sister     CABGX 1 sister and stents X1 sister  . Parkinsonism Father     Review of Systems: As noted in history of present illness. He has been diagnosed with macular degeneration. He is scheduled for cataract surgery. All other systems were reviewed and are negative.  Physical Exam: BP 133/83  Pulse 57  Ht 5\' 10"  (1.778 m)  Wt 205 lb 12.8 oz (93.35 kg)  BMI 29.53 kg/m2 He is a pleasant white male in no acute distress. HEENT normocephalic, atraumatic. Pupils are equal round and reactive to light. Oropharynx is clear. Neck is supple without JVD, adenopathy, thyromegaly, or bruits. Lungs are clear. Cardiac exam reveals a regular rate and rhythm without gallop or murmur. His abdomen is soft and nontender. He has no edema. Pedal pulses are good. Radial pulses are good. He does have bluish discoloration of his fingertips and they're cool to touch. He is alert oriented x3. Cranial nerves II through XII are intact. LABORATORY DATA:   Assessment / Plan: 1. Coronary disease status post CABG. Clinically without significant symptoms. We will update his evaluation with a nuclear stress test. Continue aspirin, isosorbide, and metoprolol.  2. Hypertension. Blood pressure is well controlled. He is not orthostatic on my exam.  3. Hyperlipidemia. We will reassess fasting lipids on lower dose of Zocor. We'll also repeat a CPK.  4. Raynaud's phenomenon. I stressed the importance of keeping his core body temperature warm. We will check an ANA and rheumatoid factor to screen for connective tissue disorder.

## 2012-02-13 ENCOUNTER — Encounter: Payer: Self-pay | Admitting: Cardiology

## 2012-02-17 ENCOUNTER — Other Ambulatory Visit: Payer: Self-pay | Admitting: *Deleted

## 2012-02-17 ENCOUNTER — Ambulatory Visit (HOSPITAL_COMMUNITY): Payer: Medicare Other | Attending: Cardiology | Admitting: Radiology

## 2012-02-17 ENCOUNTER — Other Ambulatory Visit (INDEPENDENT_AMBULATORY_CARE_PROVIDER_SITE_OTHER): Payer: Medicare Other

## 2012-02-17 ENCOUNTER — Telehealth: Payer: Self-pay

## 2012-02-17 VITALS — BP 141/82 | HR 62 | Ht 70.0 in | Wt 202.0 lb

## 2012-02-17 DIAGNOSIS — I7300 Raynaud's syndrome without gangrene: Secondary | ICD-10-CM

## 2012-02-17 DIAGNOSIS — R0609 Other forms of dyspnea: Secondary | ICD-10-CM | POA: Insufficient documentation

## 2012-02-17 DIAGNOSIS — I10 Essential (primary) hypertension: Secondary | ICD-10-CM

## 2012-02-17 DIAGNOSIS — I2510 Atherosclerotic heart disease of native coronary artery without angina pectoris: Secondary | ICD-10-CM

## 2012-02-17 DIAGNOSIS — E785 Hyperlipidemia, unspecified: Secondary | ICD-10-CM

## 2012-02-17 DIAGNOSIS — R079 Chest pain, unspecified: Secondary | ICD-10-CM

## 2012-02-17 DIAGNOSIS — I4949 Other premature depolarization: Secondary | ICD-10-CM

## 2012-02-17 DIAGNOSIS — R0789 Other chest pain: Secondary | ICD-10-CM | POA: Insufficient documentation

## 2012-02-17 DIAGNOSIS — R0989 Other specified symptoms and signs involving the circulatory and respiratory systems: Secondary | ICD-10-CM

## 2012-02-17 DIAGNOSIS — E119 Type 2 diabetes mellitus without complications: Secondary | ICD-10-CM | POA: Insufficient documentation

## 2012-02-17 DIAGNOSIS — R0602 Shortness of breath: Secondary | ICD-10-CM

## 2012-02-17 DIAGNOSIS — R000 Tachycardia, unspecified: Secondary | ICD-10-CM | POA: Insufficient documentation

## 2012-02-17 LAB — HEPATIC FUNCTION PANEL
ALT: 22 U/L (ref 0–53)
AST: 21 U/L (ref 0–37)
Albumin: 4 g/dL (ref 3.5–5.2)
Alkaline Phosphatase: 44 U/L (ref 39–117)
Bilirubin, Direct: 0.1 mg/dL (ref 0.0–0.3)
Total Protein: 6.6 g/dL (ref 6.0–8.3)

## 2012-02-17 LAB — BASIC METABOLIC PANEL
CO2: 24 mEq/L (ref 19–32)
Calcium: 9.1 mg/dL (ref 8.4–10.5)
Chloride: 106 mEq/L (ref 96–112)
Glucose, Bld: 130 mg/dL — ABNORMAL HIGH (ref 70–99)
Potassium: 4 mEq/L (ref 3.5–5.1)
Sodium: 136 mEq/L (ref 135–145)

## 2012-02-17 LAB — CK: Total CK: 97 U/L (ref 7–232)

## 2012-02-17 LAB — LIPID PANEL: Total CHOL/HDL Ratio: 3

## 2012-02-17 MED ORDER — TECHNETIUM TC 99M SESTAMIBI GENERIC - CARDIOLITE
11.0000 | Freq: Once | INTRAVENOUS | Status: AC | PRN
  Administered 2012-02-17: 11 via INTRAVENOUS

## 2012-02-17 MED ORDER — TECHNETIUM TC 99M SESTAMIBI GENERIC - CARDIOLITE
33.0000 | Freq: Once | INTRAVENOUS | Status: AC | PRN
  Administered 2012-02-17: 33 via INTRAVENOUS

## 2012-02-17 NOTE — Telephone Encounter (Signed)
Mr. Sansoucie called stated he got a phone call from Sherri telling him that a tube of blood that was supposed to be drawn was missed and something about the [place] lab. I did not see a telephone note about this phone call and explained to Mr. Wingert that I would try and get a hold of Sherri to have her call him back because I did not know why he was called or what to tell him.

## 2012-02-17 NOTE — Progress Notes (Signed)
  [facility] SITE 3 NUCLEAR MED [address] [place], [place] [address] [phone]    Cardiology Nuclear Med Study  [name] is a 78 y.o. male     MRN : [id number]     DOB: Feb 29, 1934  Procedure Date: 02/17/2012  Nuclear Med Background Indication for Stress Test:  Evaluation for Ischemia and Graft Patency History:  '06 MI>CABG; '08 Cath:2 grafts occ., EF=60%; '11 OZH:YQMV to moderate inferior ischemia, EF=72% Cardiac Risk Factors: Family History - CAD, History of Smoking, Hypertension, Lipids and NIDDM-diet controlled  Symptoms:  Chest Tightness (last episode of chest discomfort was about 2-weeks ago), DOE, Nausea and Rapid HR   Nuclear Pre-Procedure Caffeine/Decaff Intake:  None NPO After: 7:00pm   Lungs:  Clear. O2 Sat: 95% on room air. IV 0.9% NS with Angio Cath:  20g  IV Site: R Wrist  IV Started by:  Cathlyn Parsons, RN  Chest Size (in):  44 Cup Size: n/a  Height: 5\' 10"  (1.778 m)  Weight:  202 lb (91.627 kg)  BMI:  Body mass index is 28.98 kg/(m^2). Tech Comments:  Toprol held x 48 hours    Nuclear Med Study 1 or 2 day study: 1 day  Stress Test Type:  Stress  Reading MD: Olga Millers, MD  Order Authorizing Provider:  Peter Swaziland, MD  Resting Radionuclide: Technetium 74m Sestamibi  Resting Radionuclide Dose: 11.0 mCi   Stress Radionuclide:  Technetium 66m Sestamibi  Stress Radionuclide Dose: 32.9 mCi           Stress Protocol Rest HR: 62 Stress HR: 134  Rest BP: 141/82 Stress BP: 198/72  Exercise Time (min): 5:01 METS: 7.0   Predicted Max HR: 145 bpm % Max HR: 92.41 bpm Rate Pressure Product: 78469    Dose of Adenosine (mg):  n/a Dose of Lexiscan: n/a mg  Dose of Atropine (mg): n/a Dose of Dobutamine: n/a mcg/kg/min (at max HR)  Stress Test Technologist: Smiley Houseman, CMA-N  Nuclear Technologist:  Domenic Polite, CNMT     Rest Procedure:  Myocardial perfusion imaging was performed at rest 45 minutes following the intravenous  administration of Technetium 4m Sestamibi.  Rest ECG: NSR, PRWP, LAD  Stress Procedure:  The patient exercised on the treadmill utilizing the Bruce Protocol for 5:01 minutes. The patient stopped due to fatigue and denied any chest pain, he did c/o nausea post exercise.  Technetium 65m Sestamibi was injected at peak exercise and myocardial perfusion imaging was performed after a brief delay.  Stress ECG: No significant ST segment change suggestive of ischemia.  QPS Raw Data Images:  Acquisition technically good; normal left ventricular size. Stress Images:  Normal homogeneous uptake in all areas of the myocardium. Rest Images:  Normal homogeneous uptake in all areas of the myocardium. Subtraction (SDS):  No evidence of ischemia. Transient Ischemic Dilatation (Normal <1.22):  1.08 Lung/Heart Ratio (Normal <0.45):  0.46  Quantitative Gated Spect Images QGS EDV:  91 ml QGS ESV:  36 ml  Impression Exercise Capacity:  Poor exercise capacity. BP Response:  Normal blood pressure response. Clinical Symptoms:  There is dyspnea. ECG Impression:  No significant ST segment change suggestive of ischemia. Comparison with Prior Nuclear Study: No images to compare  Overall Impression:  Normal stress nuclear study.  LV Ejection Fraction: 61%.  LV Wall Motion:  NL LV Function; NL Wall Motion  Olga Millers

## 2012-02-17 NOTE — Telephone Encounter (Signed)
I called [name] back and explained to him I did get a hold of Sherri and that she would give him a call in the morning to explain why he was called by her earlier since I do not have a telephone note to help me explain to him what the phone call was about. He said it was fine for her to call him back in the morning.

## 2012-02-18 ENCOUNTER — Other Ambulatory Visit: Payer: Medicare Other

## 2012-02-18 DIAGNOSIS — E785 Hyperlipidemia, unspecified: Secondary | ICD-10-CM

## 2012-02-18 DIAGNOSIS — I2510 Atherosclerotic heart disease of native coronary artery without angina pectoris: Secondary | ICD-10-CM

## 2012-02-18 DIAGNOSIS — I10 Essential (primary) hypertension: Secondary | ICD-10-CM

## 2012-02-18 DIAGNOSIS — I7300 Raynaud's syndrome without gangrene: Secondary | ICD-10-CM

## 2012-02-18 NOTE — Telephone Encounter (Signed)
Pt was not aware where the [facility] office was. He will stop by today.

## 2012-02-19 LAB — RHEUMATOID FACTOR: Rhuematoid fact SerPl-aCnc: <10 IU/mL (ref <=14)

## 2012-02-19 LAB — ANA: Anti Nuclear Antibody(ANA): NEGATIVE

## 2012-02-20 ENCOUNTER — Encounter: Payer: Self-pay | Admitting: Cardiology

## 2012-02-20 ENCOUNTER — Telehealth: Payer: Self-pay | Admitting: Cardiology

## 2012-02-20 NOTE — Telephone Encounter (Signed)
New problem    No information was given . Wants to speak with nurse.

## 2012-02-20 NOTE — Telephone Encounter (Signed)
Spoke to patient lab results given and released to My Chart.

## 2012-02-28 ENCOUNTER — Other Ambulatory Visit: Payer: Self-pay

## 2012-03-05 ENCOUNTER — Other Ambulatory Visit: Payer: Self-pay

## 2012-03-05 MED ORDER — LOSARTAN POTASSIUM 50 MG PO TABS: 50.0000 mg | ORAL_TABLET | Freq: Every morning | ORAL | Status: DC

## 2012-03-11 ENCOUNTER — Telehealth: Payer: Self-pay

## 2012-03-11 NOTE — Telephone Encounter (Signed)
Received surgical clearance from Dr.Groat's office.Dr.Jordan cleared patient for cataract surgery,ok to stop aspirin prior to surgery.Form faxed 03/11/12 fax# [phone].

## 2012-03-24 ENCOUNTER — Other Ambulatory Visit: Payer: Self-pay

## 2012-03-24 MED ORDER — SIMVASTATIN 40 MG PO TABS: 20.0000 mg | ORAL_TABLET | Freq: Every day | ORAL | Status: DC

## 2012-03-25 ENCOUNTER — Emergency Department (HOSPITAL_COMMUNITY): Payer: Medicare Other

## 2012-03-25 ENCOUNTER — Encounter (HOSPITAL_COMMUNITY): Payer: Self-pay | Admitting: Emergency Medicine

## 2012-03-25 ENCOUNTER — Inpatient Hospital Stay (HOSPITAL_COMMUNITY)
Admission: EM | Admit: 2012-03-25 | Discharge: 2012-04-02 | DRG: 603 | Disposition: A | Discharge status: Home / Self Care | Payer: Medicare Other | Attending: Emergency Medicine | Admitting: Emergency Medicine

## 2012-03-25 ENCOUNTER — Ambulatory Visit (INDEPENDENT_AMBULATORY_CARE_PROVIDER_SITE_OTHER): Payer: Medicare Other | Admitting: Internal Medicine

## 2012-03-25 ENCOUNTER — Encounter: Payer: Self-pay | Admitting: Internal Medicine

## 2012-03-25 VITALS — BP 134/70 | HR 112 | Temp 98.4°F | Wt 204.0 lb

## 2012-03-25 DIAGNOSIS — L02519 Cutaneous abscess of unspecified hand: Principal | ICD-10-CM | POA: Diagnosis present

## 2012-03-25 DIAGNOSIS — N320 Bladder-neck obstruction: Secondary | ICD-10-CM

## 2012-03-25 DIAGNOSIS — S61451S Open bite of right hand, sequela: Secondary | ICD-10-CM

## 2012-03-25 DIAGNOSIS — K047 Periapical abscess without sinus: Secondary | ICD-10-CM | POA: Diagnosis present

## 2012-03-25 DIAGNOSIS — S61409A Unspecified open wound of unspecified hand, initial encounter: Secondary | ICD-10-CM

## 2012-03-25 DIAGNOSIS — I252 Old myocardial infarction: Secondary | ICD-10-CM

## 2012-03-25 DIAGNOSIS — Z23 Encounter for immunization: Secondary | ICD-10-CM

## 2012-03-25 DIAGNOSIS — Z951 Presence of aortocoronary bypass graft: Secondary | ICD-10-CM

## 2012-03-25 DIAGNOSIS — G4700 Insomnia, unspecified: Secondary | ICD-10-CM | POA: Diagnosis present

## 2012-03-25 DIAGNOSIS — E1169 Type 2 diabetes mellitus with other specified complication: Secondary | ICD-10-CM | POA: Diagnosis present

## 2012-03-25 DIAGNOSIS — K219 Gastro-esophageal reflux disease without esophagitis: Secondary | ICD-10-CM

## 2012-03-25 DIAGNOSIS — IMO0001 Reserved for inherently not codable concepts without codable children: Secondary | ICD-10-CM

## 2012-03-25 DIAGNOSIS — Z8546 Personal history of malignant neoplasm of prostate: Secondary | ICD-10-CM

## 2012-03-25 DIAGNOSIS — I152 Hypertension secondary to endocrine disorders: Secondary | ICD-10-CM | POA: Diagnosis present

## 2012-03-25 DIAGNOSIS — K644 Residual hemorrhoidal skin tags: Secondary | ICD-10-CM

## 2012-03-25 DIAGNOSIS — F411 Generalized anxiety disorder: Secondary | ICD-10-CM

## 2012-03-25 DIAGNOSIS — IMO0002 Reserved for concepts with insufficient information to code with codable children: Secondary | ICD-10-CM

## 2012-03-25 DIAGNOSIS — L0390 Cellulitis, unspecified: Secondary | ICD-10-CM

## 2012-03-25 DIAGNOSIS — D61818 Other pancytopenia: Secondary | ICD-10-CM

## 2012-03-25 DIAGNOSIS — Z86010 Personal history of colonic polyps: Secondary | ICD-10-CM

## 2012-03-25 DIAGNOSIS — Z79899 Other long term (current) drug therapy: Secondary | ICD-10-CM

## 2012-03-25 DIAGNOSIS — H409 Unspecified glaucoma: Secondary | ICD-10-CM | POA: Diagnosis present

## 2012-03-25 DIAGNOSIS — S61459A Open bite of unspecified hand, initial encounter: Secondary | ICD-10-CM

## 2012-03-25 DIAGNOSIS — Z791 Long term (current) use of non-steroidal anti-inflammatories (NSAID): Secondary | ICD-10-CM

## 2012-03-25 DIAGNOSIS — E785 Hyperlipidemia, unspecified: Secondary | ICD-10-CM

## 2012-03-25 DIAGNOSIS — I10 Essential (primary) hypertension: Secondary | ICD-10-CM

## 2012-03-25 DIAGNOSIS — D709 Neutropenia, unspecified: Secondary | ICD-10-CM

## 2012-03-25 DIAGNOSIS — E781 Pure hyperglyceridemia: Secondary | ICD-10-CM | POA: Diagnosis present

## 2012-03-25 DIAGNOSIS — L089 Local infection of the skin and subcutaneous tissue, unspecified: Secondary | ICD-10-CM

## 2012-03-25 DIAGNOSIS — L03011 Cellulitis of right finger: Secondary | ICD-10-CM

## 2012-03-25 DIAGNOSIS — Z7982 Long term (current) use of aspirin: Secondary | ICD-10-CM

## 2012-03-25 DIAGNOSIS — C9140 Hairy cell leukemia not having achieved remission: Secondary | ICD-10-CM | POA: Diagnosis present

## 2012-03-25 DIAGNOSIS — E1159 Type 2 diabetes mellitus with other circulatory complications: Secondary | ICD-10-CM | POA: Diagnosis present

## 2012-03-25 DIAGNOSIS — W540XXA Bitten by dog, initial encounter: Secondary | ICD-10-CM

## 2012-03-25 DIAGNOSIS — I2510 Atherosclerotic heart disease of native coronary artery without angina pectoris: Secondary | ICD-10-CM | POA: Diagnosis present

## 2012-03-25 DIAGNOSIS — R5081 Fever presenting with conditions classified elsewhere: Secondary | ICD-10-CM | POA: Diagnosis present

## 2012-03-25 DIAGNOSIS — W5501XA Bitten by cat, initial encounter: Secondary | ICD-10-CM

## 2012-03-25 DIAGNOSIS — L03129 Acute lymphangitis of unspecified part of limb: Secondary | ICD-10-CM

## 2012-03-25 DIAGNOSIS — K5730 Diverticulosis of large intestine without perforation or abscess without bleeding: Secondary | ICD-10-CM

## 2012-03-25 DIAGNOSIS — R238 Other skin changes: Secondary | ICD-10-CM

## 2012-03-25 DIAGNOSIS — I7300 Raynaud's syndrome without gangrene: Secondary | ICD-10-CM

## 2012-03-25 DIAGNOSIS — R251 Tremor, unspecified: Secondary | ICD-10-CM

## 2012-03-25 DIAGNOSIS — S61451D Open bite of right hand, subsequent encounter: Secondary | ICD-10-CM

## 2012-03-25 LAB — CBC WITH DIFFERENTIAL/PLATELET
Basophils Absolute: 0 10*3/uL (ref 0.0–0.1)
Basophils Relative: 0 % (ref 0–1)
Basophils Relative: 0 % (ref 0–1)
Eosinophils Absolute: 0 10*3/uL (ref 0.0–0.7)
Eosinophils Relative: 0 % (ref 0–5)
HCT: 29.1 % — ABNORMAL LOW (ref 39.0–52.0)
Hemoglobin: 10.7 g/dL — ABNORMAL LOW (ref 13.0–17.0)
Hemoglobin: 10 g/dL — ABNORMAL LOW (ref 13.0–17.0)
Lymphocytes Relative: 0 % — ABNORMAL LOW (ref 12–46)
Lymphocytes Relative: 48 % — ABNORMAL HIGH (ref 12–46)
Lymphs Abs: 0.4 10*3/uL — ABNORMAL LOW (ref 0.7–4.0)
MCHC: 34 g/dL (ref 30.0–36.0)
MCV: 98 fL (ref 78.0–100.0)
Monocytes Absolute: 0 10*3/uL — ABNORMAL LOW (ref 0.1–1.0)
Monocytes Relative: 1 % — ABNORMAL LOW (ref 3–12)
Neutro Abs: 0.5 10*3/uL — ABNORMAL LOW (ref 1.7–7.7)
Neutrophils Relative %: 0 % — ABNORMAL LOW (ref 43–77)
Platelets: 56 10*3/uL — ABNORMAL LOW (ref 150–400)
RBC: 2.97 MIL/uL — ABNORMAL LOW (ref 4.22–5.81)
RDW: 15.1 % (ref 11.5–15.5)
WBC: 0.9 10*3/uL — CL (ref 4.0–10.5)

## 2012-03-25 LAB — BASIC METABOLIC PANEL
CO2: 22 mEq/L (ref 19–32)
Calcium: 8.8 mg/dL (ref 8.4–10.5)
Creatinine, Ser: 1.08 mg/dL (ref 0.50–1.35)
GFR calc Af Amer: 76 mL/min — ABNORMAL LOW (ref >90)
GFR calc non Af Amer: 65 mL/min — ABNORMAL LOW (ref >90)
Sodium: 131 mEq/L — ABNORMAL LOW (ref 135–145)

## 2012-03-25 MED ORDER — ASPIRIN 81 MG PO TABS: 81.0000 mg | ORAL_TABLET | Freq: Every day | ORAL | Status: DC

## 2012-03-25 MED ORDER — SODIUM CHLORIDE 0.9 % IV SOLN
INTRAVENOUS | Status: DC
  Administered 2012-03-25: 19:00:00 via INTRAVENOUS

## 2012-03-25 MED ORDER — ACETAMINOPHEN 650 MG RE SUPP: 650.0000 mg | Freq: Four times a day (QID) | RECTAL | Status: DC | PRN

## 2012-03-25 MED ORDER — ONE-DAILY MULTI VITAMINS PO TABS: 1.0000 | ORAL_TABLET | Freq: Every day | ORAL | Status: DC

## 2012-03-25 MED ORDER — OXYCODONE HCL 5 MG PO TABS
5.0000 mg | ORAL_TABLET | ORAL | Status: DC | PRN
  Administered 2012-03-26 – 2012-03-31 (×6): 5 mg via ORAL
  Filled 2012-03-25 (×7): qty 1

## 2012-03-25 MED ORDER — LOSARTAN POTASSIUM 50 MG PO TABS
50.0000 mg | ORAL_TABLET | Freq: Every morning | ORAL | Status: DC
  Administered 2012-03-26 – 2012-03-31 (×6): 50 mg via ORAL
  Filled 2012-03-25 (×9): qty 1

## 2012-03-25 MED ORDER — ONDANSETRON HCL 4 MG/2ML IJ SOLN
4.0000 mg | Freq: Four times a day (QID) | INTRAMUSCULAR | Status: DC | PRN
  Filled 2012-03-25: qty 2

## 2012-03-25 MED ORDER — ACETAMINOPHEN 325 MG PO TABS: 650.0000 mg | ORAL_TABLET | Freq: Four times a day (QID) | ORAL | Status: DC | PRN

## 2012-03-25 MED ORDER — PANTOPRAZOLE SODIUM 40 MG PO TBEC
40.0000 mg | DELAYED_RELEASE_TABLET | Freq: Every day | ORAL | Status: DC
  Administered 2012-03-26 – 2012-04-02 (×8): 40 mg via ORAL
  Filled 2012-03-25 (×9): qty 1

## 2012-03-25 MED ORDER — PIPERACILLIN-TAZOBACTAM 3.375 G IVPB
3.3750 g | Freq: Three times a day (TID) | INTRAVENOUS | Status: DC
  Filled 2012-03-25: qty 50

## 2012-03-25 MED ORDER — PIPERACILLIN-TAZOBACTAM 3.375 G IVPB
3.3750 g | Freq: Three times a day (TID) | INTRAVENOUS | Status: DC
  Administered 2012-03-26 – 2012-04-02 (×22): 3.375 g via INTRAVENOUS
  Filled 2012-03-25 (×25): qty 50

## 2012-03-25 MED ORDER — SIMVASTATIN 20 MG PO TABS
20.0000 mg | ORAL_TABLET | Freq: Every day | ORAL | Status: DC
  Administered 2012-03-26 – 2012-04-01 (×7): 20 mg via ORAL
  Filled 2012-03-25 (×9): qty 1

## 2012-03-25 MED ORDER — ASPIRIN EC 81 MG PO TBEC
81.0000 mg | DELAYED_RELEASE_TABLET | Freq: Every day | ORAL | Status: DC
  Filled 2012-03-25 (×2): qty 1

## 2012-03-25 MED ORDER — LATANOPROST 0.005 % OP SOLN
1.0000 [drp] | Freq: Every day | OPHTHALMIC | Status: DC
  Administered 2012-03-26 – 2012-04-01 (×7): 1 [drp] via OPHTHALMIC
  Filled 2012-03-25 (×2): qty 2.5

## 2012-03-25 MED ORDER — ONDANSETRON HCL 4 MG PO TABS: 4.0000 mg | ORAL_TABLET | Freq: Four times a day (QID) | ORAL | Status: DC | PRN

## 2012-03-25 MED ORDER — METOPROLOL SUCCINATE ER 50 MG PO TB24
50.0000 mg | ORAL_TABLET | Freq: Every evening | ORAL | Status: DC
  Administered 2012-03-26 – 2012-04-01 (×7): 50 mg via ORAL
  Filled 2012-03-25 (×9): qty 1

## 2012-03-25 MED ORDER — NITROGLYCERIN 0.4 MG SL SUBL: 0.4000 mg | SUBLINGUAL_TABLET | SUBLINGUAL | Status: DC | PRN

## 2012-03-25 MED ORDER — SODIUM CHLORIDE 0.9 % IV SOLN
3.0000 g | Freq: Once | INTRAVENOUS | Status: AC
  Administered 2012-03-25: 3 g via INTRAVENOUS
  Filled 2012-03-25: qty 3

## 2012-03-25 MED ORDER — PIPERACILLIN-TAZOBACTAM 3.375 G IVPB
3.3750 g | Freq: Once | INTRAVENOUS | Status: DC
  Administered 2012-03-25: 3.375 g via INTRAVENOUS
  Filled 2012-03-25: qty 50

## 2012-03-25 MED ORDER — ADULT MULTIVITAMIN W/MINERALS CH
1.0000 | ORAL_TABLET | Freq: Every day | ORAL | Status: DC
  Filled 2012-03-25 (×2): qty 1

## 2012-03-25 MED ORDER — SODIUM CHLORIDE 0.9 % IV SOLN
INTRAVENOUS | Status: AC
Start: 2012-03-25 — End: 2012-03-26
  Administered 2012-03-25 – 2012-03-26 (×2): via INTRAVENOUS

## 2012-03-25 MED ORDER — VANCOMYCIN HCL IN DEXTROSE 1-5 GM/200ML-% IV SOLN
1000.0000 mg | Freq: Two times a day (BID) | INTRAVENOUS | Status: DC
  Administered 2012-03-25 – 2012-03-28 (×7): 1000 mg via INTRAVENOUS
  Filled 2012-03-25 (×8): qty 200

## 2012-03-25 MED ORDER — ISOSORBIDE MONONITRATE ER 60 MG PO TB24
60.0000 mg | ORAL_TABLET | Freq: Every day | ORAL | Status: DC
  Administered 2012-03-26 – 2012-04-02 (×7): 60 mg via ORAL
  Filled 2012-03-25 (×10): qty 1

## 2012-03-25 NOTE — Progress Notes (Addendum)
ANTIBIOTIC CONSULT NOTE - INITIAL  Pharmacy Consult for Vancomycin Indication: cellulitis  Allergies  Allergen Reactions  . Clindamycin Other (See Comments)    UNKNOWN    Patient Measurements: Height: 5' 10.08" (178 cm) Weight: 203 lb 14.8 oz (92.5 kg) IBW/kg (Calculated) : 73.18  Vital Signs: Temp: 98.1 F (36.7 C) (03/13 1952) Temp src: Oral (03/13 1704) BP: 118/54 mmHg (03/13 1952) Pulse Rate: 86 (03/13 1952) Intake/Output from previous day:   Intake/Output from this shift:    Labs:  Recent Labs  03/25/12 1750  WBC 0.9*  HGB 10.7*  PLT 56*  CREATININE 1.08   Estimated Creatinine Clearance: 67.6 ml/min (by C-G formula based on Cr of 1.08). No results found for this basename: VANCOTROUGH, VANCOPEAK, VANCORANDOM, GENTTROUGH, GENTPEAK, GENTRANDOM, TOBRATROUGH, TOBRAPEAK, TOBRARND, AMIKACINPEAK, AMIKACINTROU, AMIKACIN,  in the last 72 hours   Microbiology: No results found for this or any previous visit (from the past 720 hour(s)).  Medical History: Past Medical History  Diagnosis Date  . GERD (gastroesophageal reflux disease)   . Hypertension   . Hyperlipidemia   . Insomnia   . Hypertriglyceridemia   . History of atherosclerotic cardiovascular disease   . Bladder neck contracture   . Stable angina   . History of myocardial infarction 2006   S/P CABG  . S/P CABG x 4 2006  . Coronary artery disease CARDIOLOGIST- DR [place]-  LAST VISIT NOTE 12-20-2010 IN EPIC AND W/ CHART    subendocranial MI; left inter. mamm to LAD, saph vein to diag; saph vein to distal circ; saph vein to 1st obtuse  . Arthritis   . H/O hiatal hernia   . History of kidney stones   . Glaucoma(365) BILATERAL  . Cataract immature BILATERAL  . Frequency of urination   . Urgency of urination   . Nocturia   . History of prostate cancer S/P PROSTATECTOMY-  NO RECURRENCE    FOLLOWED BY DR Isabel Caprice  . Macular degeneration   . Raynaud's phenomenon     Assessment:  77 yom s/p cat bite 3/11,  presented 3/13 with pain and swelling of R hand/arm along with fever and chills.  R hand X-ray negative for abnormalities.  Of note, patient is s/p cataract surgery in February and had a postoperative infection.  MD ordered Unasyn and Zosyn x 1 along with Vancomycin per pharmacy for cellulitis of R arm/hand.   Scr 1.08 for CG CrCl of 68 ml/min and normalized CrCl of 60 ml/min.  Tmax 100, WBC 0.9, patient found neutropenic.   Goal of Therapy:  Vancomycin trough level 10-15 mcg/ml  Plan:   Vancomycin 1gm IV q12h   Given immunocompromised state, MD would like to start Zosyn along with Vancomycin.  Received verbal order to continue Zosyn (will order Zosyn 3.375 gm IV q8h extended dosing infusion over 4 hours after initial dose)   Pharmacy will f/u   Geoffry Paradise, PharmD, BCPS Pager: [phone] 8:25 PM Pharmacy #: [id number]

## 2012-03-25 NOTE — Progress Notes (Signed)
  Subjective:    Patient ID: [name], male    DOB: 1934-07-18, 78 y.o.   MRN: [id number]  HPI He was playing roughly with his PET cat 03/24/12 when he was bitten approximately 5 PM on his right hand.  There's been progressive swelling of the right thenar eminence. He was unaware of red streaks of  over the medial & ventral radial line of the forearm.  This morning he began to have right lower and chills without fever and sweats.  His CAT is vaccinated.  His last tetanus shot is unknown; no records are in the EMR.   Review of Systems   For several weeks he has had some chest congestion with white fleeing. There has been no definite associated signs of rhinosinusitis      Objective:   Physical Exam  He exhibits intermittent shaking. He appears fatigued and acutely ill.  Range of motion the neck is decreased but there is no evidence of meningismus.  He has no lymphadenopathy about the neck, axilla, or epitrochlear areas.  Exhibits an S4 gallop with flow murmur.  Chest is clear with no increased work of breathing. Breath sounds are somewhat decreased.  Abdomen reveals no organomegaly, masses, or tenderness.  He has no clubbing, cyanosis, or edema.  There is erythema extending from the right radial base of the thumb approximately 8 cm proximally from the puncture wound. There is a smaller puncture wound with no associated significant erythema along the ventral thenar eminence. Along the medial ventral forearm there is erythematous line extending 2 cm above the antecubital fossa         Assessment & Plan:   #1 Cat bite with localized cellulitis and rapidly progressive lymphangitis  Plan: The dramatic progression of the cellulitis in less than 24 hours mandates parenteral therapy. Potentially Hand Surgery may need to be involved for incision and drainage

## 2012-03-25 NOTE — H&P (Addendum)
[facility] History and Physical  [name] UXL:[id number] DOB: June 28, 1934 DOA: 03/25/2012  Referring physician: Dr.Paulina. PCP: [name], MD  Specialists: None.  Chief Complaint: Right hand pain and swelling after cat bite.  HPI: [name] is a 78 y.o. male with history of CAD status post CABG, hypertension, hyperlipidemia was referred to the ER after patient had gone to his PCPs office after having a cat bite last night on his hand. Patient noticed increased swelling and pain of the right hand where the cat bit and patient also had subjective feeling of fever and chills at the PCPs office. He was given tetanus toxoid and referred to the ER. In the ER x-rays of the hand reveal no foreign body or any acute features. Patient in addition has been found to have pancytopenia. Patient has been admitted for further management. Patient states that he has had 3 weeks ago root canal treatment for which he had taken amoxicillin for a few days. 2 weeks ago he had cataract surgery and had been on eyedrops. Other than these medications he has not been on any other new medications. Denies any recent travel or insect bites. In the system patient's last complete blood count was in 2011 which was showing normal counts and mild thrombocytopenia.  Review of Systems: As presented in the history of presenting illness nothing else significant.  Past Medical History  Diagnosis Date  . GERD (gastroesophageal reflux disease)   . Hypertension   . Hyperlipidemia   . Insomnia   . Hypertriglyceridemia   . History of atherosclerotic cardiovascular disease   . Bladder neck contracture   . Stable angina   . History of myocardial infarction 2006   S/P CABG  . S/P CABG x 4 2006  . Coronary artery disease CARDIOLOGIST- DR [place]-  LAST VISIT NOTE 12-20-2010 IN EPIC AND W/ CHART    subendocranial MI; left inter. mamm to LAD, saph vein to diag; saph vein to distal circ; saph vein to 1st obtuse  .  Arthritis   . H/O hiatal hernia   . History of kidney stones   . Glaucoma(365) BILATERAL  . Cataract immature BILATERAL  . Frequency of urination   . Urgency of urination   . Nocturia   . History of prostate cancer S/P PROSTATECTOMY-  NO RECURRENCE    FOLLOWED BY DR Isabel Caprice  . Macular degeneration   . Raynaud's phenomenon    Past Surgical History  Procedure Laterality Date  . Debridement tennis elbow  2002    RIGHT; [facility]  . Robot assisted laparoscopic radical prostatectomy  05-11-2006  . Cardiac catheterization  07-30-2006  DR [place]    POST CABG / OCCLUDED DIAGONAL &  FOURTH OBTUSE MARGINAL GRAFTS/ NORMAL LVF/ PATENT LIMA TO LAD & SEPHENOUS VEIN GRAFT TO 1ST OBTUSE MARGINAL   . Cardiac catheterization  04-23-2004    CRITICAL THREE-VESSEL CAD/ PRESERVED LVF  . Laparoscopic cholecystectomy  02-05-2005  . Coronary artery bypass graft  04-25-2004  X4 VESSELS    left inter. mamm to LAD, saph vein to diag; saph vein to distal circ; saph vein to 1st obtuse  . Cystoscopy  04/30/2011    Procedure: CYSTOSCOPY;  Surgeon: Valetta Fuller, MD;  Location[facility];  Service: Urology;  Laterality: N/A;  30 MINUTES   . Balloon dilation  04/30/2011    Procedure: BALLOON DILATION;  Surgeon: Valetta Fuller, MD;  Location: [facility];  Service: Urology;  Laterality: N/A;  Social History:  reports that he has quit smoking. His smoking use included Cigarettes and Cigars. He smoked 0.00 packs per day for 3 years. He has never used smokeless tobacco. He reports that he drinks about 8.4 ounces of alcohol per week. He reports that he does not use illicit drugs. Lives at home. where does patient live-- Can do ADLs. Can patient participate in ADLs?  Allergies  Allergen Reactions  . Clindamycin Other (See Comments)    UNKNOWN    Family History  Problem Relation Age of Onset  . Heart disease Sister     CABGX 1 sister and stents X1 sister  . Cancer Sister   .  Parkinsonism Father   . Diabetes Neg Hx   . Stroke Neg Hx       Prior to Admission medications   Medication Sig Start Date End Date Taking? Authorizing Yves Fodor  ibuprofen (ADVIL,MOTRIN) 200 MG tablet Take 400 mg by mouth every 6 (six) hours as needed for pain.   Yes Historical Shaeley Segall, MD  isosorbide mononitrate (IMDUR) 60 MG 24 hr tablet Take 1 tablet (60 mg total) by mouth daily. 09/09/11  Yes Peter M [place], MD  latanoprost (XALATAN) 0.005 % ophthalmic solution Place 1 drop into both eyes at bedtime.    Yes Historical Lloyd Cullinan, MD  losartan (COZAAR) 50 MG tablet Take 1 tablet (50 mg total) by mouth every morning. 03/05/12 03/05/13 Yes Peter M [place], MD  metoprolol succinate (TOPROL-XL) 50 MG 24 hr tablet Take 1 tablet (50 mg total) by mouth every evening. 09/09/11  Yes Peter M [place], MD  Multiple Vitamin (MULTIVITAMIN) tablet Take 1 tablet by mouth daily.    Yes Historical Mackinley Cassaday, MD  nitroGLYCERIN (NITROSTAT) 0.4 MG SL tablet Place 1 tablet (0.4 mg total) under the tongue every 5 (five) minutes as needed for chest pain. 12/20/10 01/13/97 Yes Peter M [place], MD  omeprazole (PRILOSEC) 20 MG capsule Take 20 mg by mouth every morning.    Yes Historical Shian Goodnow, MD  simvastatin (ZOCOR) 40 MG tablet Take 0.5 tablets (20 mg total) by mouth daily. 03/24/12  Yes Peter M [place], MD  aspirin 81 MG tablet Take 81 mg by mouth daily.     Historical Wai Litt, MD   Physical Exam: Filed Vitals:   03/25/12 1704 03/25/12 1952  BP: 141/65 118/54  Pulse: 88 86  Temp: 100 F (37.8 C) 98.1 F (36.7 C)  TempSrc: Oral   Resp:  20  Height:  5' 10.08" (1.78 m)  Weight:  92.5 kg (203 lb 14.8 oz)  SpO2: 100% 96%     General:  Well-developed and nourished.  Eyes: Anicteric no pallor.  ENT: No discharge from the ears eyes nose or mouth.  Neck: No mass felt.  Cardiovascular: S1-S2 heard. Regular.  Respiratory: No rhonchi no crepitations.  Abdomen: Soft nontender bowel sounds present.  Skin:  Indurated skin in the first webspace of the right hand with erythema. No active discharge. 2 bite marks seen.  Musculoskeletal: Patient has difficulty to fully flex right thumb. Able to completely flex remaining fingers of the right hand. Swelling of the right first web area. Pulses felt.  Psychiatric: Appears normal.  Neurologic: Alert awake oriented to time place and person. Moves all extremities.  Labs on Admission:  Basic Metabolic Panel:  Recent Labs Lab 03/25/12 1750  NA 131*  K 4.1  CL 97  CO2 22  GLUCOSE 126*  BUN 17  CREATININE 1.08  CALCIUM 8.8   Liver Function Tests:  No results found for this basename: AST, ALT, ALKPHOS, BILITOT, PROT, ALBUMIN,  in the last 168 hours No results found for this basename: LIPASE, AMYLASE,  in the last 168 hours No results found for this basename: AMMONIA,  in the last 168 hours CBC:  Recent Labs Lab 03/25/12 1750 03/25/12 1958  WBC 0.9* 0.9*  NEUTROABS  --  0.5*  HGB 10.7* 10.0*  HCT 31.5* 29.1*  MCV 98.7 98.0  PLT 56* 52*   Cardiac Enzymes: No results found for this basename: CKTOTAL, CKMB, CKMBINDEX, TROPONINI,  in the last 168 hours  BNP (last 3 results) No results found for this basename: PROBNP,  in the last 8760 hours CBG: No results found for this basename: GLUCAP,  in the last 168 hours  Radiological Exams on Admission: Dg Hand Complete Right  03/25/2012  *RADIOLOGY REPORT*  Clinical Data: Recurrent skin infections  RIGHT HAND - COMPLETE 3+ VIEW  Comparison: None.  Findings: Negative for fracture.  Negative for arthropathy.  No foreign body or soft tissue swelling.  IMPRESSION: Negative   Original Report Authenticated By: Janeece Riggers, M.D.      Assessment/Plan Principal Problem:   Infection of hand due to bite Active Problems:   HYPERLIPIDEMIA   Hypertension   Pancytopenia   CAD (coronary artery disease)   1. Cellulitis of the right hand status post cat bite - initially patient was started on Unasyn  which was changed to Zosyn because of patient's pancytopenia. Vancomycin has been added for now until blood cultures are available. Hand surgeon Dr. Izora Ribas has been consulted and will follow his recommendations. Follow blood cultures. 2. Pancytopenia with neutropenia(febrile neutropenia) - patient has been placed on neutropenic precautions. Follow blood cultures. Check HIV. Check peripheral smear study. Consult hematologist for further recommendations. 3. CAD status post CABG - we'll hold off aspirin due to thrombocytopenia until we know the platelet trends. 4. Hypertension - continue present medications. 5. Hyperlipidemia - continue present medications. 6. History of alcoholism - patient drinks 2-3 cups of wine every night. Patient is placed on Ativan withdrawal protocol.    Code Status: Full code.  Family Communication: None.  Disposition Plan: Admit to inpatient.   KAKRAKANDY,ARSHAD N. [facility] Pager [phone].  If 7PM-7AM, please contact night-coverage [email or web address] Password [facility] 03/25/2012, 9:32 PM

## 2012-03-25 NOTE — ED Notes (Signed)
Pt has a cat and it scratched him 2 days ago and now has red area to rt hand and red marks that radates up rt arm to axillary. Low grade temp. Shots up to date.

## 2012-03-25 NOTE — ED Provider Notes (Addendum)
History     CSN: [id number]  Arrival date & time 03/25/12  1649   First MD Initiated Contact with Patient 03/25/12 1721      Chief Complaint  Patient presents with  . Recurrent Skin Infections    (Consider location/radiation/quality/duration/timing/severity/associated sxs/prior treatment) HPI Comments: Patient refer to the ER by his primary doctor because of the hand and arm infection. Patient reports that he was bitten by his cat on the hand yesterday. Patient reports that since then he has had pain and swelling of the hand. The redness has now started to streak up his arm. He has been feeling weak with chills today. He went to his doctor's office and was referred to the ER.  Patient reports that the cat is his CAT scan all vaccinations are up-to-date. Patient concerned because he had cataract surgery within the last few weeks and had a postoperative infection. He also had a boil on his skin one week ago as well. He is concerned because of these recurrent infections.   Past Medical History  Diagnosis Date  . GERD (gastroesophageal reflux disease)   . Hypertension   . Hyperlipidemia   . Insomnia   . Hypertriglyceridemia   . History of atherosclerotic cardiovascular disease   . Bladder neck contracture   . Stable angina   . History of myocardial infarction 2006   S/P CABG  . S/P CABG x 4 2006  . Coronary artery disease CARDIOLOGIST- DR [place]-  LAST VISIT NOTE 12-20-2010 IN EPIC AND W/ CHART    subendocranial MI; left inter. mamm to LAD, saph vein to diag; saph vein to distal circ; saph vein to 1st obtuse  . Arthritis   . H/O hiatal hernia   . History of kidney stones   . Glaucoma(365) BILATERAL  . Cataract immature BILATERAL  . Frequency of urination   . Urgency of urination   . Nocturia   . History of prostate cancer S/P PROSTATECTOMY-  NO RECURRENCE    FOLLOWED BY DR Isabel Caprice  . Macular degeneration   . Raynaud's phenomenon     Past Surgical History  Procedure  Laterality Date  . Debridement tennis elbow  2002    RIGHT; [facility]  . Robot assisted laparoscopic radical prostatectomy  05-11-2006  . Cardiac catheterization  07-30-2006  DR [place]    POST CABG / OCCLUDED DIAGONAL &  FOURTH OBTUSE MARGINAL GRAFTS/ NORMAL LVF/ PATENT LIMA TO LAD & SEPHENOUS VEIN GRAFT TO 1ST OBTUSE MARGINAL   . Cardiac catheterization  04-23-2004    CRITICAL THREE-VESSEL CAD/ PRESERVED LVF  . Laparoscopic cholecystectomy  02-05-2005  . Coronary artery bypass graft  04-25-2004  X4 VESSELS    left inter. mamm to LAD, saph vein to diag; saph vein to distal circ; saph vein to 1st obtuse  . Cystoscopy  04/30/2011    Procedure: CYSTOSCOPY;  Surgeon: Valetta Fuller, MD;  Location: [facility];  Service: Urology;  Laterality: N/A;  30 MINUTES   . Balloon dilation  04/30/2011    Procedure: BALLOON DILATION;  Surgeon: Valetta Fuller, MD;  Location: [facility];  Service: Urology;  Laterality: N/A;    Family History  Problem Relation Age of Onset  . Heart disease Sister     CABGX 1 sister and stents X1 sister  . Cancer Sister   . Parkinsonism Father   . Diabetes Neg Hx   . Stroke Neg Hx     History  Substance Use Topics  .  Smoking status: Former Smoker -- 3 years    Types: Cigarettes, Cigars  . Smokeless tobacco: Never Used     Comment: QUIT SMOKING CIGARETTES 1964 AND QUIT OCCASIONAL CIGAR  2007  . Alcohol Use: 8.4 oz/week    7 Glasses of wine, 7 Shots of liquor per week      Review of Systems  Constitutional: Positive for fever and chills.  Skin: Positive for color change and wound.  All other systems reviewed and are negative.    Allergies  Clindamycin  Home Medications   Current Outpatient Rx  Name  Route  Sig  Dispense  Refill  . aspirin 81 MG tablet   Oral   Take 81 mg by mouth daily.          . fluticasone (FLONASE) 50 MCG/ACT nasal spray   Nasal   Place 1 spray into the nose 2 (two) times daily as needed for  rhinitis.   16 g   2   . isosorbide mononitrate (IMDUR) 60 MG 24 hr tablet   Oral   Take 1 tablet (60 mg total) by mouth daily.   90 tablet   3   . latanoprost (XALATAN) 0.005 % ophthalmic solution   Both Eyes   Place 1 drop into both eyes at bedtime.          [name] losartan (COZAAR) 50 MG tablet   Oral   Take 1 tablet (50 mg total) by mouth every morning.   30 tablet   1   . metoprolol succinate (TOPROL-XL) 50 MG 24 hr tablet   Oral   Take 1 tablet (50 mg total) by mouth every evening.   90 tablet   3   . Multiple Vitamin (MULTIVITAMIN) tablet   Oral   Take 1 tablet by mouth daily.          . nitroGLYCERIN (NITROSTAT) 0.4 MG SL tablet   Sublingual   Place 1 tablet (0.4 mg total) under the tongue every 5 (five) minutes as needed for chest pain.   100 tablet   3   . omeprazole (PRILOSEC) 20 MG capsule   Oral   Take 20 mg by mouth every morning.          . simvastatin (ZOCOR) 40 MG tablet   Oral   Take 0.5 tablets (20 mg total) by mouth daily.   30 tablet   6     BP 141/65  Pulse 88  Temp(Src) 100 F (37.8 C) (Oral)  SpO2 100%  Physical Exam  Constitutional: He is oriented to person, place, and time. He appears well-developed and well-nourished. No distress.  HENT:  Head: Normocephalic and atraumatic.  Right Ear: Hearing normal.  Nose: Nose normal.  Mouth/Throat: Oropharynx is clear and moist and mucous membranes are normal.  Eyes: Conjunctivae and EOM are normal. Pupils are equal, round, and reactive to light.  Neck: Normal range of motion. Neck supple.  Cardiovascular: Normal rate, regular rhythm, S1 normal and S2 normal.  Exam reveals no gallop and no friction rub.   No murmur heard. Pulmonary/Chest: Effort normal and breath sounds normal. No respiratory distress. He exhibits no tenderness.  Abdominal: Soft. Normal appearance and bowel sounds are normal. There is no hepatosplenomegaly. There is no tenderness. There is no rebound, no guarding, no  tenderness at McBurney's point and negative Murphy's sign. No hernia.  Musculoskeletal: Normal range of motion.       Right hand: He exhibits tenderness and swelling. He exhibits normal  range of motion, normal capillary refill and no deformity. Normal sensation noted. Normal strength noted.       Hands: Neurological: He is alert and oriented to person, place, and time. He has normal strength. No cranial nerve deficit or sensory deficit. Coordination normal. GCS eye subscore is 4. GCS verbal subscore is 5. GCS motor subscore is 6.  Skin: Skin is warm, dry and intact. No rash noted. No cyanosis.     Psychiatric: He has a normal mood and affect. His speech is normal and behavior is normal. Thought content normal.    ED Course  Procedures (including critical care time)  Labs Reviewed  BASIC METABOLIC PANEL - Abnormal; Notable for the following:    Sodium 131 (*)    Glucose, Bld 126 (*)    GFR calc non Af Amer 65 (*)    GFR calc Af Amer 76 (*)    All other components within normal limits  CULTURE, BLOOD (ROUTINE X 2)  CULTURE, BLOOD (ROUTINE X 2)  CBC WITH DIFFERENTIAL   Dg Hand Complete Right  03/25/2012  *RADIOLOGY REPORT*  Clinical Data: Recurrent skin infections  RIGHT HAND - COMPLETE 3+ VIEW  Comparison: None.  Findings: Negative for fracture.  Negative for arthropathy.  No foreign body or soft tissue swelling.  IMPRESSION: Negative   Original Report Authenticated By: Janeece Riggers, M.D.      Diagnosis: 1. Cellulitis right hand and arm 2. Cat Bite    MDM  Patient presents to the ER for evaluation of rapidly progressing infection of his right hand and wrist area after a cat bite which occurred yesterday. Patient has low-grade fever and chills. Lymphangitis extends up to the axilla. Patient will require hospitalization for IV antibiotic therapy. Initial antibiotic given in ER is Unasyn, which covers Pasteurella multocida. If no response, would have to cover for MRSA as  well.  Discussed with Dr. Izora Ribas, on-call for hand surgery. He agrees with the current management. He recommends that the scabs over the Plates be removed to allow for some possible drainage. He'll see the patient and follow.      Gilda Crease, MD 03/25/12 1851  Gilda Crease, MD 03/25/12 2018

## 2012-03-25 NOTE — Patient Instructions (Addendum)
I have called the emergency room and discussed situation with the triage team. Review and correct the record as indicated. Please share record with all medical staff seen.

## 2012-03-26 DIAGNOSIS — L0390 Cellulitis, unspecified: Secondary | ICD-10-CM

## 2012-03-26 DIAGNOSIS — T148XXA Other injury of unspecified body region, initial encounter: Secondary | ICD-10-CM

## 2012-03-26 DIAGNOSIS — L0291 Cutaneous abscess, unspecified: Secondary | ICD-10-CM

## 2012-03-26 DIAGNOSIS — IMO0001 Reserved for inherently not codable concepts without codable children: Secondary | ICD-10-CM

## 2012-03-26 LAB — CBC WITH DIFFERENTIAL/PLATELET
Eosinophils Absolute: 0 10*3/uL (ref 0.0–0.7)
Eosinophils Relative: 1 % (ref 0–5)
Lymphs Abs: 0.7 10*3/uL (ref 0.7–4.0)
MCH: 33.3 pg (ref 26.0–34.0)
MCHC: 33.9 g/dL (ref 30.0–36.0)
MCV: 98.2 fL (ref 78.0–100.0)
Monocytes Absolute: 0 10*3/uL — ABNORMAL LOW (ref 0.1–1.0)
Neutrophils Relative %: 39 % — ABNORMAL LOW (ref 43–77)
Platelets: 50 10*3/uL — ABNORMAL LOW (ref 150–400)
RDW: 15.2 % (ref 11.5–15.5)

## 2012-03-26 LAB — PATHOLOGIST SMEAR REVIEW

## 2012-03-26 LAB — COMPREHENSIVE METABOLIC PANEL
ALT: 12 U/L (ref 0–53)
AST: 11 U/L (ref 0–37)
CO2: 23 mEq/L (ref 19–32)
Calcium: 8.6 mg/dL (ref 8.4–10.5)
Creatinine, Ser: 1 mg/dL (ref 0.50–1.35)
GFR calc Af Amer: 83 mL/min — ABNORMAL LOW (ref >90)
GFR calc non Af Amer: 71 mL/min — ABNORMAL LOW (ref >90)
Glucose, Bld: 141 mg/dL — ABNORMAL HIGH (ref 70–99)
Sodium: 135 mEq/L (ref 135–145)
Total Protein: 6.1 g/dL (ref 6.0–8.3)

## 2012-03-26 LAB — HIV ANTIBODY (ROUTINE TESTING W REFLEX): HIV: NONREACTIVE

## 2012-03-26 MED ORDER — NON FORMULARY: 1.0000 | Freq: Every day | Status: DC

## 2012-03-26 MED ORDER — FOLIC ACID 1 MG PO TABS
1.0000 mg | ORAL_TABLET | Freq: Every day | ORAL | Status: DC
  Administered 2012-03-26 – 2012-04-02 (×8): 1 mg via ORAL
  Filled 2012-03-26 (×8): qty 1

## 2012-03-26 MED ORDER — ADULT MULTIVITAMIN W/MINERALS CH
1.0000 | ORAL_TABLET | Freq: Every day | ORAL | Status: DC
  Administered 2012-03-26 – 2012-04-02 (×8): 1 via ORAL
  Filled 2012-03-26 (×8): qty 1

## 2012-03-26 MED ORDER — LORAZEPAM 1 MG PO TABS: 1.0000 mg | ORAL_TABLET | Freq: Four times a day (QID) | ORAL | Status: AC | PRN

## 2012-03-26 MED ORDER — OCUVITE PRESERVISION PO TABS
1.0000 | ORAL_TABLET | Freq: Every day | ORAL | Status: DC
  Administered 2012-03-27: 11:00:00 via ORAL
  Filled 2012-03-26: qty 1

## 2012-03-26 MED ORDER — VITAMIN B-1 100 MG PO TABS
100.0000 mg | ORAL_TABLET | Freq: Every day | ORAL | Status: DC
  Administered 2012-03-26 – 2012-04-02 (×8): 100 mg via ORAL
  Filled 2012-03-26 (×8): qty 1

## 2012-03-26 MED ORDER — LORAZEPAM 2 MG/ML IJ SOLN: 1.0000 mg | Freq: Four times a day (QID) | INTRAMUSCULAR | Status: AC | PRN

## 2012-03-26 MED ORDER — THIAMINE HCL 100 MG/ML IJ SOLN
100.0000 mg | Freq: Every day | INTRAMUSCULAR | Status: DC
  Filled 2012-03-26 (×8): qty 1

## 2012-03-26 NOTE — Progress Notes (Signed)
[facility] PROGRESS NOTE  [name] NFA:[id number] DOB: 30-Jun-1934 DOA: 03/25/2012 PCP: [name], MD   Brief negative:  78 year old male with history of cornea artery disease status post CABG, hyperlipidemia, hypertension who was admitted for having a cat bite on his right hand. It was also noted to be pancytopenic.  Assessment/Plan Cellulitis of the right hand Secondary to cat bite. Patient given a dose of Unasyn in the ED but will change to dosing given pancytopenia. Added vancomycin on admission. Dr. Izora Ribas (hand surgeon) was consulted from the ED who will follow patient. Blood cultures ordered on admission. He remains afebrile this morning. There for any worsening of swelling. X-ray on admission unremarkable. We'll get CT of the hand if worsen or not improved.  Pancytopenia --ANC of  450. No clear etiology and unlikely to be related to the infection it is quite acute. Order refills a study. Continue neutropenic precautions. HIV negative. Mild follow blood culture.  -Discussed with hematology consult Dr. Kendall Flack over the phone and recommends to monitor for now an outpatient followup. If unimproved will continue to get worse will request for reevaluation. -has history of prostate cancer and has been stable for past 6 months. Follows with Dr. Isabel Caprice  CAD Status post CABG. Holding aspirin given thrombocytopenia  Hypertension Continue home medications  Hyperlipidemia Stable. Continue home medications  DVT prophylaxis: SCD  Code Status: Full code Family Communication: None at bedside Disposition Plan: Home Once stable   Consultants:  Hand surgeon (Dr. Izora Ribas) called from the ED  Procedures:  None  Antibiotics:  IV vancomycin and Zosyn (day 1)  HPI/Subjective: Admission H&P reviewed. Patient continued to have pain over right hand  Objective: Filed Vitals:   03/25/12 1952 03/25/12 2201 03/26/12 0507 03/26/12 1327  BP: 118/54 161/69 119/69 115/64   Pulse: 86 91 67 65  Temp: 98.1 F (36.7 C) 100 F (37.8 C) 98.4 F (36.9 C) 98.2 F (36.8 C)  TempSrc:  Oral Oral   Resp: 20 18 16 16   Height: 5' 10.08" (1.78 m) 5\' 11"  (1.803 m)    Weight: 92.5 kg (203 lb 14.8 oz) 92.987 kg (205 lb)    SpO2: 96% 97% 96% 98%    Intake/Output Summary (Last 24 hours) at 03/26/12 1453 Last data filed at 03/26/12 1100  Gross per 24 hour  Intake    980 ml  Output      0 ml  Net    980 ml   Filed Weights   03/25/12 1952 03/25/12 2201  Weight: 92.5 kg (203 lb 14.8 oz) 92.987 kg (205 lb)    Exam:   General: Elderly male lying in bed in no acute distress  HEENT: No pallor, moist oral mucosa  Cardiovascular: Normal S1 and S2, no murmurs rub or gallop  Respiratory: Clear to auscultation bilaterally, no added sounds  Abdomen: Soft, nontender, nondistended, bowel sounds present  Musculoskeletal: Diffuse swelling tenderness over right hand and the wrist with erythema extending up to the arm. Limited range of motion  CNS: AAO x3  Data Reviewed: Basic Metabolic Panel:  Recent Labs Lab 03/25/12 1750 03/26/12 0415  NA 131* 135  K 4.1 3.7  CL 97 103  CO2 22 23  GLUCOSE 126* 141*  BUN 17 14  CREATININE 1.08 1.00  CALCIUM 8.8 8.6   Liver Function Tests:  Recent Labs Lab 03/26/12 0415  AST 11  ALT 12  ALKPHOS 53  BILITOT 0.6  PROT 6.1  ALBUMIN 3.0*   No results found  for this basename: LIPASE, AMYLASE,  in the last 168 hours No results found for this basename: AMMONIA,  in the last 168 hours CBC:  Recent Labs Lab 21-Apr-2012 1750 Apr 21, 2012 1958 03/26/12 0415  WBC 0.9* 0.9* 1.1*  NEUTROABS  --  0.5* 0.4*  HGB 10.7* 10.0* 9.5*  HCT 31.5* 29.1* 28.0*  MCV 98.7 98.0 98.2  PLT 56* 52* 50*   Cardiac Enzymes: No results found for this basename: CKTOTAL, CKMB, CKMBINDEX, TROPONINI,  in the last 168 hours BNP (last 3 results) No results found for this basename: PROBNP,  in the last 8760 hours CBG: No results found for this  basename: GLUCAP,  in the last 168 hours  Recent Results (from the past 240 hour(s))  CULTURE, BLOOD (ROUTINE X 2)     Status: None   Collection Time    April 21, 2012  5:50 PM      Result Value Range Status   Specimen Description BLOOD LEFT ARM   Final   Special Requests BOTTLES DRAWN AEROBIC AND ANAEROBIC   Final   Culture  Setup Time Apr 21, 2012 22:44   Final   Culture     Final   Value:        BLOOD CULTURE RECEIVED NO GROWTH TO DATE CULTURE WILL BE HELD FOR 5 DAYS BEFORE ISSUING A FINAL NEGATIVE REPORT   Report Status PENDING   Incomplete  CULTURE, BLOOD (ROUTINE X 2)     Status: None   Collection Time    April 21, 2012  5:55 PM      Result Value Range Status   Specimen Description BLOOD LEFT ARM   Final   Special Requests BOTTLES DRAWN AEROBIC AND ANAEROBIC   Final   Culture  Setup Time 04/21/2012 22:44   Final   Culture     Final   Value:        BLOOD CULTURE RECEIVED NO GROWTH TO DATE CULTURE WILL BE HELD FOR 5 DAYS BEFORE ISSUING A FINAL NEGATIVE REPORT   Report Status PENDING   Incomplete     Studies: Dg Hand Complete Right  04-21-2012  *RADIOLOGY REPORT*  Clinical Data: Recurrent skin infections  RIGHT HAND - COMPLETE 3+ VIEW  Comparison: None.  Findings: Negative for fracture.  Negative for arthropathy.  No foreign body or soft tissue swelling.  IMPRESSION: Negative   Original Report Authenticated By: Janeece Riggers, M.D.     Scheduled Meds: . folic acid  1 mg Oral Daily  . isosorbide mononitrate  60 mg Oral Daily  . latanoprost  1 drop Both Eyes QHS  . losartan  50 mg Oral q morning - 10a  . metoprolol succinate  50 mg Oral QPM  . multivitamin with minerals  1 tablet Oral Daily  . pantoprazole  40 mg Oral Daily  . piperacillin-tazobactam (ZOSYN)  IV  3.375 g Intravenous Q8H  . simvastatin  20 mg Oral q1800  . thiamine  100 mg Oral Daily   Or  . thiamine  100 mg Intravenous Daily  . vancomycin  1,000 mg Intravenous BID   Continuous Infusions: . sodium chloride 100  mL/hr at 03/26/12 0615      Time spent: 25 MINUTES    Eddie North  [facility] Pager [phone]. If 7PM-7AM, please contact night-coverage at [email or web address], password [facility] 03/26/2012, 2:53 PM  LOS: 1 day

## 2012-03-26 NOTE — Progress Notes (Signed)
Utilization review completed.  

## 2012-03-26 NOTE — Consult Note (Signed)
Reason for Consult:infection, cat bite Referring Physician: ER/Hospitalist   [name] is an 78 y.o. right handed male.  HPI: pt was playing with cat the other day and was bitten, c/o progressive pain, swelling and redness of R hand up arm  Past Medical History  Diagnosis Date  . GERD (gastroesophageal reflux disease)   . Hypertension   . Hyperlipidemia   . Insomnia   . Hypertriglyceridemia   . History of atherosclerotic cardiovascular disease   . Bladder neck contracture   . Stable angina   . History of myocardial infarction 2006   S/P CABG  . S/P CABG x 4 2006  . Coronary artery disease CARDIOLOGIST- DR [place]-  LAST VISIT NOTE 12-20-2010 IN EPIC AND W/ CHART    subendocranial MI; left inter. mamm to LAD, saph vein to diag; saph vein to distal circ; saph vein to 1st obtuse  . Arthritis   . H/O hiatal hernia   . History of kidney stones   . Glaucoma(365) BILATERAL  . Cataract immature BILATERAL  . Frequency of urination   . Urgency of urination   . Nocturia   . History of prostate cancer S/P PROSTATECTOMY-  NO RECURRENCE    FOLLOWED BY DR Isabel Caprice  . Macular degeneration   . Raynaud's phenomenon     Past Surgical History  Procedure Laterality Date  . Debridement tennis elbow  2002    RIGHT; [facility]  . Robot assisted laparoscopic radical prostatectomy  05-11-2006  . Cardiac catheterization  07-30-2006  DR [place]    POST CABG / OCCLUDED DIAGONAL &  FOURTH OBTUSE MARGINAL GRAFTS/ NORMAL LVF/ PATENT LIMA TO LAD & SEPHENOUS VEIN GRAFT TO 1ST OBTUSE MARGINAL   . Cardiac catheterization  04-23-2004    CRITICAL THREE-VESSEL CAD/ PRESERVED LVF  . Laparoscopic cholecystectomy  02-05-2005  . Coronary artery bypass graft  04-25-2004  X4 VESSELS    left inter. mamm to LAD, saph vein to diag; saph vein to distal circ; saph vein to 1st obtuse  . Cystoscopy  04/30/2011    Procedure: CYSTOSCOPY;  Surgeon: Valetta Fuller, MD;  Location: [facility];  Service:  Urology;  Laterality: N/A;  30 MINUTES   . Balloon dilation  04/30/2011    Procedure: BALLOON DILATION;  Surgeon: Valetta Fuller, MD;  Location: [facility];  Service: Urology;  Laterality: N/A;    Family History  Problem Relation Age of Onset  . Heart disease Sister     CABGX 1 sister and stents X1 sister  . Cancer Sister   . Parkinsonism Father   . Diabetes Neg Hx   . Stroke Neg Hx     Social History:  reports that he has quit smoking. His smoking use included Cigarettes and Cigars. He smoked 0.00 packs per day for 3 years. He has never used smokeless tobacco. He reports that he drinks about 8.4 ounces of alcohol per week. He reports that he does not use illicit drugs.  Allergies:  Allergies  Allergen Reactions  . Clindamycin Other (See Comments)    UNKNOWN    Medications: I have reviewed the patient's current medications.  Results for orders placed during the hospital encounter of 03/25/12 (from the past 48 hour(s))  CBC WITH DIFFERENTIAL     Status: Abnormal   Collection Time    03/25/12  5:50 PM      Result Value Range   WBC 0.9 (*) 4.0 - 10.5 K/uL   Comment: RESULT REPEATED  AND VERIFIED     CRITICAL RESULT CALLED TO, READ BACK BY AND VERIFIED WITH:     DUDLELY,F. AT 1913 ON [id number] BY LOVE,T.   RBC 3.19 (*) 4.22 - 5.81 MIL/uL   Hemoglobin 10.7 (*) 13.0 - 17.0 g/dL   HCT 30.8 (*) 65.7 - 84.6 %   MCV 98.7  78.0 - 100.0 fL   MCH 33.5  26.0 - 34.0 pg   MCHC 34.0  30.0 - 36.0 g/dL   RDW 96.2  95.2 - 84.1 %   Platelets 56 (*) 150 - 400 K/uL   Neutrophils Relative 0 (*) 43 - 77 %   Lymphocytes Relative 0 (*) 12 - 46 %   Monocytes Relative 0 (*) 3 - 12 %   Eosinophils Relative 0  0 - 5 %   Basophils Relative 0  0 - 1 %   Band Neutrophils 0  0 - 10 %   nRBC 0  0 /100 WBC   Lymphs Abs 0.0 (*) 0.7 - 4.0 K/uL   Monocytes Absolute 0.0 (*) 0.1 - 1.0 K/uL   Eosinophils Absolute 0.0  0.0 - 0.7 K/uL   Basophils Absolute 0.0  0.0 - 0.1 K/uL   WBC Morphology TOO  FEW TO COUNT, SMEAR AVAILABLE FOR REVIEW     Comment: WHITE COUNT CONFIRMED ON SMEAR     FEW LYMPHOCYTES SEEN.  BASIC METABOLIC PANEL     Status: Abnormal   Collection Time    03/25/12  5:50 PM      Result Value Range   Sodium 131 (*) 135 - 145 mEq/L   Potassium 4.1  3.5 - 5.1 mEq/L   Chloride 97  96 - 112 mEq/L   CO2 22  19 - 32 mEq/L   Glucose, Bld 126 (*) 70 - 99 mg/dL   BUN 17  6 - 23 mg/dL   Creatinine, Ser 3.24  0.50 - 1.35 mg/dL   Calcium 8.8  8.4 - 40.1 mg/dL   GFR calc non Af Amer 65 (*) >90 mL/min   GFR calc Af Amer 76 (*) >90 mL/min   Comment:            The eGFR has been calculated     using the CKD EPI equation.     This calculation has not been     validated in all clinical     situations.     eGFR's persistently     <90 mL/min signify     possible Chronic Kidney Disease.  CULTURE, BLOOD (ROUTINE X 2)     Status: None   Collection Time    03/25/12  5:50 PM      Result Value Range   Specimen Description BLOOD LEFT ARM     Special Requests BOTTLES DRAWN AEROBIC AND ANAEROBIC     Culture  Setup Time 03/25/2012 22:44     Culture       Value:        BLOOD CULTURE RECEIVED NO GROWTH TO DATE CULTURE WILL BE HELD FOR 5 DAYS BEFORE ISSUING A FINAL NEGATIVE REPORT   Report Status PENDING    HIV ANTIBODY (ROUTINE TESTING)     Status: None   Collection Time    03/25/12  5:50 PM      Result Value Range   HIV NON REACTIVE  NON REACTIVE  CULTURE, BLOOD (ROUTINE X 2)     Status: None   Collection Time    03/25/12  5:55 PM  Result Value Range   Specimen Description BLOOD LEFT ARM     Special Requests BOTTLES DRAWN AEROBIC AND ANAEROBIC     Culture  Setup Time 03/25/2012 22:44     Culture       Value:        BLOOD CULTURE RECEIVED NO GROWTH TO DATE CULTURE WILL BE HELD FOR 5 DAYS BEFORE ISSUING A FINAL NEGATIVE REPORT   Report Status PENDING    PATHOLOGIST SMEAR REVIEW     Status: None   Collection Time    03/25/12  7:29 PM      Result Value Range   Path  Review Reviewed By Havery Moros, M.D.     Comment: 03.14.14     PANCYTOPENIA.  CBC WITH DIFFERENTIAL     Status: Abnormal   Collection Time    03/25/12  7:58 PM      Result Value Range   WBC 0.9 (*) 4.0 - 10.5 K/uL   Comment: CRITICAL VALUE NOTED.  VALUE IS CONSISTENT WITH PREVIOUSLY REPORTED AND CALLED VALUE.   RBC 2.97 (*) 4.22 - 5.81 MIL/uL   Hemoglobin 10.0 (*) 13.0 - 17.0 g/dL   HCT 78.2 (*) 95.6 - 21.3 %   MCV 98.0  78.0 - 100.0 fL   MCH 33.7  26.0 - 34.0 pg   MCHC 34.4  30.0 - 36.0 g/dL   RDW 08.6  57.8 - 46.9 %   Platelets 52 (*) 150 - 400 K/uL   Neutrophils Relative 51  43 - 77 %   Lymphocytes Relative 48 (*) 12 - 46 %   Monocytes Relative 1 (*) 3 - 12 %   Eosinophils Relative 0  0 - 5 %   Basophils Relative 0  0 - 1 %   Neutro Abs 0.5 (*) 1.7 - 7.7 K/uL   Lymphs Abs 0.4 (*) 0.7 - 4.0 K/uL   Monocytes Absolute 0.0 (*) 0.1 - 1.0 K/uL   Eosinophils Absolute 0.0  0.0 - 0.7 K/uL   Basophils Absolute 0.0  0.0 - 0.1 K/uL   RBC Morphology POLYCHROMASIA PRESENT     Comment: TEARDROP CELLS   WBC Morphology TOO FEW TO COUNT, SMEAR AVAILABLE FOR REVIEW     Smear Review PENDING PATHOLOGIST REVIEW    CBC WITH DIFFERENTIAL     Status: Abnormal   Collection Time    03/26/12  4:15 AM      Result Value Range   WBC 1.1 (*) 4.0 - 10.5 K/uL   Comment: REPEATED TO VERIFY     CRITICAL VALUE NOTED.  VALUE IS CONSISTENT WITH PREVIOUSLY REPORTED AND CALLED VALUE.   RBC 2.85 (*) 4.22 - 5.81 MIL/uL   Hemoglobin 9.5 (*) 13.0 - 17.0 g/dL   HCT 62.9 (*) 52.8 - 41.3 %   MCV 98.2  78.0 - 100.0 fL   MCH 33.3  26.0 - 34.0 pg   MCHC 33.9  30.0 - 36.0 g/dL   RDW 24.4  01.0 - 27.2 %   Platelets 50 (*) 150 - 400 K/uL   Comment: CONSISTENT WITH PREVIOUS RESULT   Neutrophils Relative 39 (*) 43 - 77 %   Lymphocytes Relative 58 (*) 12 - 46 %   Monocytes Relative 2 (*) 3 - 12 %   Eosinophils Relative 1  0 - 5 %   Basophils Relative 0  0 - 1 %   Neutro Abs 0.4 (*) 1.7 - 7.7 K/uL   Lymphs Abs 0.7   0.7 -  4.0 K/uL   Monocytes Absolute 0.0 (*) 0.1 - 1.0 K/uL   Eosinophils Absolute 0.0  0.0 - 0.7 K/uL   Basophils Absolute 0.0  0.0 - 0.1 K/uL   RBC Morphology POLYCHROMASIA PRESENT     Comment: ELLIPTOCYTES  COMPREHENSIVE METABOLIC PANEL     Status: Abnormal   Collection Time    03/26/12  4:15 AM      Result Value Range   Sodium 135  135 - 145 mEq/L   Potassium 3.7  3.5 - 5.1 mEq/L   Chloride 103  96 - 112 mEq/L   CO2 23  19 - 32 mEq/L   Glucose, Bld 141 (*) 70 - 99 mg/dL   BUN 14  6 - 23 mg/dL   Creatinine, Ser 1.61  0.50 - 1.35 mg/dL   Calcium 8.6  8.4 - 09.6 mg/dL   Total Protein 6.1  6.0 - 8.3 g/dL   Albumin 3.0 (*) 3.5 - 5.2 g/dL   AST 11  0 - 37 U/L   ALT 12  0 - 53 U/L   Alkaline Phosphatase 53  39 - 117 U/L   Total Bilirubin 0.6  0.3 - 1.2 mg/dL   GFR calc non Af Amer 71 (*) >90 mL/min   GFR calc Af Amer 83 (*) >90 mL/min   Comment:            The eGFR has been calculated     using the CKD EPI equation.     This calculation has not been     validated in all clinical     situations.     eGFR's persistently     <90 mL/min signify     possible Chronic Kidney Disease.  TSH     Status: None   Collection Time    03/26/12  4:15 AM      Result Value Range   TSH 0.559  0.350 - 4.500 uIU/mL    Dg Hand Complete Right  03/25/2012  *RADIOLOGY REPORT*  Clinical Data: Recurrent skin infections  RIGHT HAND - COMPLETE 3+ VIEW  Comparison: None.  Findings: Negative for fracture.  Negative for arthropathy.  No foreign body or soft tissue swelling.  IMPRESSION: Negative   Original Report Authenticated By: Janeece Riggers, M.D.     Pertinent items are noted in HPI. Temp:  [98.2 F (36.8 C)-100 F (37.8 C)] 98.2 F (36.8 C) (03/14 1327) Pulse Rate:  [65-91] 65 (03/14 1327) Resp:  [16-18] 16 (03/14 1327) BP: (115-161)/(64-69) 115/64 mmHg (03/14 1327) SpO2:  [96 %-98 %] 98 % (03/14 1327) Weight:  [92.987 kg (205 lb)] 92.987 kg (205 lb) (03/13 2201) General appearance: alert and  cooperative Resp: clear to auscultation bilaterally Cardio: regular rate and rhythm GI: soft, non-tender; bowel sounds normal; no masses,  no organomegaly Extremities: extremities normal, atraumatic, no cyanosis or edema and except for RUE - dorsal 1st webspace with erythema, puncture wound with some purulence, erythema radiating up arm, tender to palpation dorsally in web space    Assessment/Plan: S/p cat bite R hand with cellulitis; pancytopenia; multiple medical problems Plan: erythema improved since abx therapy; cont broad spectrum abx, wound irrigated out and small piece of iodoform gauze inserted into wound, elevate R hand, minimal use of R hand; work up pancytopenia per medicine, will follow, may require operative intervention.  COLEY,HARRILL CHRISTOPHER 03/26/2012, 9:49 PM

## 2012-03-27 LAB — CBC WITH DIFFERENTIAL/PLATELET
Basophils Absolute: 0 10*3/uL (ref 0.0–0.1)
Lymphocytes Relative: 66 % — ABNORMAL HIGH (ref 12–46)
Lymphs Abs: 1 10*3/uL (ref 0.7–4.0)
Neutro Abs: 0.5 10*3/uL — ABNORMAL LOW (ref 1.7–7.7)
Platelets: 58 10*3/uL — ABNORMAL LOW (ref 150–400)
RBC: 2.97 MIL/uL — ABNORMAL LOW (ref 4.22–5.81)
RDW: 15.4 % (ref 11.5–15.5)
WBC: 1.5 10*3/uL — ABNORMAL LOW (ref 4.0–10.5)

## 2012-03-27 NOTE — Progress Notes (Signed)
[facility] PROGRESS NOTE  [name] ZOX:[id number] DOB: 10-Apr-1934 DOA: 03/25/2012 PCP: Derek Melnick, MD  Brief negative:  78 year old male with history of cornea artery disease status post CABG, hyperlipidemia, hypertension who was admitted for having a cat bite on his right hand. It was also noted to be pancytopenic.   Assessment/Plan  Cellulitis of the right hand  Secondary to cat bite. Patient given a dose of Unasyn in the ED but changed to IV  Zosyn given pancytopenia. Added vancomycin on admission. Dr. Izora Nash (hand surgeon) was consulted who evaluated the patient and irrigated some wound out and small piece of either phone call was inserted into the wound. Recommend limit duration and minimal use of the right hand.. Blood cultures ordered on admission.  He remains afebrile Monitor for any worsening of swelling. X-ray on admission unremarkable. We'll get CT of the hand if worsen or not improved.   Pancytopenia  -Mildly improved in a.m. labs. ANC off 510 today. Hemoglobin and Platelet slightly improved as well. No clear etiology and unlikely to be related to the infection it is quite acute. Would have an underlying myeloproliferative disorder. Peripheral smear ordered. Continue neutropenic precautions. HIV negative. Mild follow blood culture.  -Discussed with hematology consult Dr. Shanda Nash over the phone and recommends to monitor for now with  outpatient followup. If unimproved or  continue to get worse will request for reevaluation.  -has history of prostate cancer and has been stable for past 6 months. Follows with Dr. Isabel Nash .  CAD  Status post CABG. Holding aspirin given thrombocytopenia   Hypertension  Continue home medications   Hyperlipidemia  Stable. Continue home medications   DVT prophylaxis: SCD   Code Status: Full code   Family Communication: None at bedside  Disposition Plan: Home Once stable  Consultants:  Hand surgeon (Dr. Izora Nash)  Procedures:   None Antibiotics:  IV vancomycin and Zosyn (day 2)   HPI/Subjective: Patient seen and examined this morning. Pain over right and better.  Objective: Filed Vitals:   03/26/12 0507 03/26/12 1327 03/27/12 0214 03/27/12 0548  BP: 119/69 115/64 135/62 127/65  Pulse: 67 65 60 60  Temp: 98.4 F (36.9 C) 98.2 F (36.8 C) 98.1 F (36.7 C) 98.2 F (36.8 C)  TempSrc: Oral  Oral Oral  Resp: 16 16 20 20   Height:      Weight:      SpO2: 96% 98% 97% 95%    Intake/Output Summary (Last 24 hours) at 03/27/12 1219 Last data filed at 03/27/12 0950  Gross per 24 hour  Intake    440 ml  Output      0 ml  Net    440 ml   Filed Weights   03/25/12 1952 03/25/12 2201  Weight: 92.5 kg (203 lb 14.8 oz) 92.987 kg (205 lb)    Exam:  General: Elderly male lying in bed in no acute distress  HEENT: No pallor, moist oral mucosa  Cardiovascular: Normal S1 and S2, no murmurs rub or gallop  Respiratory: Clear to auscultation bilaterally, no added sounds  Abdomen: Soft, nontender, nondistended, bowel sounds present  Musculoskeletal: swelling and tenderness over right hand  with erythema extending up to the arm. Swelling mildly improved from yesterday. No tenderness over the wrist. Better finger grasp. CNS: AAO x3   Data Reviewed: Basic Metabolic Panel:  Recent Labs Lab 03/25/12 1750 03/26/12 0415  NA 131* 135  K 4.1 3.7  CL 97 103  CO2 22 23  GLUCOSE 126* 141*  BUN 17 14  CREATININE 1.08 1.00  CALCIUM 8.8 8.6   Liver Function Tests:  Recent Labs Lab 03/26/12 0415  AST 11  ALT 12  ALKPHOS 53  BILITOT 0.6  PROT 6.1  ALBUMIN 3.0*   No results found for this basename: LIPASE, AMYLASE,  in the last 168 hours No results found for this basename: AMMONIA,  in the last 168 hours CBC:  Recent Labs Lab 2012-04-13 1750 Apr 13, 2012 1958 03/26/12 0415 03/27/12 0524  WBC 0.9* 0.9* 1.1* 1.5*  NEUTROABS  --  0.5* 0.4* 0.5*  HGB 10.7* 10.0* 9.5* 10.1*  HCT 31.5* 29.1* 28.0* 29.1*  MCV  98.7 98.0 98.2 98.0  PLT 56* 52* 50* 58*   Cardiac Enzymes: No results found for this basename: CKTOTAL, CKMB, CKMBINDEX, TROPONINI,  in the last 168 hours BNP (last 3 results) No results found for this basename: PROBNP,  in the last 8760 hours CBG: No results found for this basename: GLUCAP,  in the last 168 hours  Recent Results (from the past 240 hour(s))  CULTURE, BLOOD (ROUTINE X 2)     Status: None   Collection Time    Apr 13, 2012  5:50 PM      Result Value Range Status   Specimen Description BLOOD LEFT ARM   Final   Special Requests BOTTLES DRAWN AEROBIC AND ANAEROBIC   Final   Culture  Setup Time April 13, 2012 22:44   Final   Culture     Final   Value:        BLOOD CULTURE RECEIVED NO GROWTH TO DATE CULTURE WILL BE HELD FOR 5 DAYS BEFORE ISSUING A FINAL NEGATIVE REPORT   Report Status PENDING   Incomplete  CULTURE, BLOOD (ROUTINE X 2)     Status: None   Collection Time    04-13-12  5:55 PM      Result Value Range Status   Specimen Description BLOOD LEFT ARM   Final   Special Requests BOTTLES DRAWN AEROBIC AND ANAEROBIC   Final   Culture  Setup Time 13-Apr-2012 22:44   Final   Culture     Final   Value:        BLOOD CULTURE RECEIVED NO GROWTH TO DATE CULTURE WILL BE HELD FOR 5 DAYS BEFORE ISSUING A FINAL NEGATIVE REPORT   Report Status PENDING   Incomplete     Studies: Dg Hand Complete Right  2012/04/13  *RADIOLOGY REPORT*  Clinical Data: Recurrent skin infections  RIGHT HAND - COMPLETE 3+ VIEW  Comparison: None.  Findings: Negative for fracture.  Negative for arthropathy.  No foreign body or soft tissue swelling.  IMPRESSION: Negative   Original Report Authenticated By: [name], M.D.     Scheduled Meds: . folic acid  1 mg Oral Daily  . isosorbide mononitrate  60 mg Oral Daily  . latanoprost  1 drop Both Eyes QHS  . losartan  50 mg Oral q morning - 10a  . metoprolol succinate  50 mg Oral QPM  . multivitamin with minerals  1 tablet Oral Daily  . Ocuvite  PreserVision  1 tablet Oral Daily  . pantoprazole  40 mg Oral Daily  . piperacillin-tazobactam (ZOSYN)  IV  3.375 g Intravenous Q8H  . simvastatin  20 mg Oral q1800  . thiamine  100 mg Oral Daily   Or  . thiamine  100 mg Intravenous Daily  . vancomycin  1,000 mg Intravenous BID   Continuous Infusions:     Time spent: 25  minutes    Eddie North  [facility] Pager [phone]. If 7PM-7AM, please contact night-coverage at [email or web address], password [facility] 03/27/2012, 12:19 PM  LOS: 2 days

## 2012-03-28 DIAGNOSIS — L03119 Cellulitis of unspecified part of limb: Principal | ICD-10-CM

## 2012-03-28 DIAGNOSIS — D649 Anemia, unspecified: Secondary | ICD-10-CM

## 2012-03-28 DIAGNOSIS — D709 Neutropenia, unspecified: Secondary | ICD-10-CM

## 2012-03-28 DIAGNOSIS — L02519 Cutaneous abscess of unspecified hand: Principal | ICD-10-CM

## 2012-03-28 LAB — CBC WITH DIFFERENTIAL/PLATELET
Basophils Absolute: 0 10*3/uL (ref 0.0–0.1)
Basophils Relative: 0 % (ref 0–1)
Eosinophils Relative: 0 % (ref 0–5)
HCT: 26.9 % — ABNORMAL LOW (ref 39.0–52.0)
Hemoglobin: 9.4 g/dL — ABNORMAL LOW (ref 13.0–17.0)
Lymphocytes Relative: 73 % — ABNORMAL HIGH (ref 12–46)
MCV: 96.8 fL (ref 78.0–100.0)
Monocytes Relative: 1 % — ABNORMAL LOW (ref 3–12)
Neutro Abs: 0.3 10*3/uL — ABNORMAL LOW (ref 1.7–7.7)
RBC: 2.78 MIL/uL — ABNORMAL LOW (ref 4.22–5.81)
WBC: 1 10*3/uL — CL (ref 4.0–10.5)

## 2012-03-28 MED ORDER — OCUVITE PRESERVISION PO TABS
1.0000 | ORAL_TABLET | Freq: Two times a day (BID) | ORAL | Status: DC
  Administered 2012-03-28: 22:00:00 via ORAL
  Administered 2012-03-29 – 2012-03-30 (×3): 1 via ORAL
  Administered 2012-03-30: 11:00:00 via ORAL
  Administered 2012-03-31 (×2): 1 via ORAL
  Administered 2012-04-01: 23:00:00 via ORAL
  Administered 2012-04-01 – 2012-04-02 (×2): 1 via ORAL

## 2012-03-28 MED ORDER — SODIUM CHLORIDE 0.9 % IV SOLN
INTRAVENOUS | Status: DC | PRN
  Administered 2012-03-28: 10 mL/h via INTRAVENOUS

## 2012-03-28 MED ORDER — VANCOMYCIN HCL 10 G IV SOLR
1250.0000 mg | Freq: Two times a day (BID) | INTRAVENOUS | Status: DC
  Administered 2012-03-29 – 2012-03-31 (×5): 1250 mg via INTRAVENOUS
  Filled 2012-03-28 (×5): qty 1250

## 2012-03-28 NOTE — Progress Notes (Addendum)
PHARMACY - VANCOMYCIN  Vancomycin trough < 5 mcg/ml on regimen of 1000mg  IV q12h.  Trough goal 10-15 mcg/ml for cellulitis.  Day #4 Vancomycin along with Zosyn for cellulitis due to cat bite in setting of pancytopenia.  Vancomycin 1000mg  IV last charted as given @ 21:18 tonight, therefore will adjust regimen with next dose.  PLAN:  Increase Vancomycin to 1250mg  IV q12h per dosing nomogram             Follow clinical symptoms, renal function             Check vancomycin trough level when appropriate.  Terrilee Files, PharmD

## 2012-03-28 NOTE — Progress Notes (Signed)
[facility] PROGRESS NOTE  [name] WUJ:[id number] DOB: 1934/08/04 DOA: 03/25/2012 PCP: [name], MD    Brief narrative 78 year-old male with history of cornea artery disease status post CABG, hyperlipidemia, hypertension who was admitted for having a cat bite on his right hand. It was also noted to be pancytopenic.    Assessment/Plan  Cellulitis of the right hand  Secondary to cat bite. Patient given a dose of Unasyn in the ED but changed to IV Zosyn given pancytopenia. Added vancomycin on admission. Dr. Izora Ribas (hand surgeon) was consulted who evaluated the patient and irrigated some wound out and small piece of either phone call was inserted into the wound. Recommend limit duration and minimal use of the right hand.. Blood cultures negative He remains afebrile  Monitor for any worsening of swelling. X-ray on admission unremarkable.   Pancytopenia   -No clear etiology. Continues to be pancytopenic. ANC of 390 today. Patient likely had a low cell counts which was worsened by recent infection. Appreciate hematology consult. Plan on bone marrow biopsy, flow cytometry tomorrow. Ultrasound abdomen to evaluate the liver. We'll also check for stool for occult blood , iron panel and B12 level. -has history of prostate cancer and has been stable for past 6 months. Follows with Dr. Isabel Caprice .   CAD  Status post CABG. Holding aspirin given thrombocytopenia   Hypertension  Continue home medications   Hyperlipidemia  Stable. Continue home medications   DVT prophylaxis: SCD  Code Status: Full code  Family Communication: None at bedside  Disposition Plan: Home Once stable  Consultants:  Hand surgeon (Dr. Izora Ribas)  Procedures:  None Antibiotics:  IV vancomycin and Zosyn (day 2)  HPI/Subjective: No overnight issues. Remains afebrile  Objective: Filed Vitals:   03/27/12 0548 03/27/12 1359 03/27/12 2100 03/28/12 0508  BP: 127/65 121/60 121/87 120/74  Pulse: 60 66 67 56   Temp: 98.2 F (36.8 C) 98.1 F (36.7 C) 98.5 F (36.9 C) 98 F (36.7 C)  TempSrc: Oral Oral Oral Oral  Resp: 20 20 20 20   Height:      Weight:      SpO2: 95% 96% 97% 97%    Intake/Output Summary (Last 24 hours) at 03/28/12 1424 Last data filed at 03/28/12 1046  Gross per 24 hour  Intake   1040 ml  Output      0 ml  Net   1040 ml   Filed Weights   03/25/12 1952 03/25/12 2201  Weight: 92.5 kg (203 lb 14.8 oz) 92.987 kg (205 lb)    Exam: General: Elderly male lying in bed in no acute distress  HEENT: No pallor, moist oral mucosa  Cardiovascular: Normal S1 and S2, no murmurs rub or gallop  Respiratory: Clear to auscultation bilaterally, no added sounds  Abdomen: Soft, nontender, nondistended, bowel sounds present  Musculoskeletal:  Swelling still present but appear slightly improved. No tenderness over the wrist. Better finger grasp.  CNS: AAO x3   Data Reviewed: Basic Metabolic Panel:  Recent Labs Lab 03/25/12 1750 03/26/12 0415  NA 131* 135  K 4.1 3.7  CL 97 103  CO2 22 23  GLUCOSE 126* 141*  BUN 17 14  CREATININE 1.08 1.00  CALCIUM 8.8 8.6   Liver Function Tests:  Recent Labs Lab 03/26/12 0415  AST 11  ALT 12  ALKPHOS 53  BILITOT 0.6  PROT 6.1  ALBUMIN 3.0*   No results found for this basename: LIPASE, AMYLASE,  in the last 168 hours No  results found for this basename: AMMONIA,  in the last 168 hours CBC:  Recent Labs Lab 03/25/12 1750 03/25/12 1958 03/26/12 0415 03/27/12 0524 03/28/12 0410  WBC 0.9* 0.9* 1.1* 1.5* 1.0*  NEUTROABS  --  0.5* 0.4* 0.5* 0.3*  HGB 10.7* 10.0* 9.5* 10.1* 9.4*  HCT 31.5* 29.1* 28.0* 29.1* 26.9*  MCV 98.7 98.0 98.2 98.0 96.8  PLT 56* 52* 50* 58* 51*   Cardiac Enzymes: No results found for this basename: CKTOTAL, CKMB, CKMBINDEX, TROPONINI,  in the last 168 hours BNP (last 3 results) No results found for this basename: PROBNP,  in the last 8760 hours CBG: No results found for this basename: GLUCAP,  in the  last 168 hours  Recent Results (from the past 240 hour(s))  CULTURE, BLOOD (ROUTINE X 2)     Status: None   Collection Time    03/25/12  5:50 PM      Result Value Range Status   Specimen Description BLOOD LEFT ARM   Final   Special Requests BOTTLES DRAWN AEROBIC AND ANAEROBIC   Final   Culture  Setup Time 03/25/2012 22:44   Final   Culture     Final   Value:        BLOOD CULTURE RECEIVED NO GROWTH TO DATE CULTURE WILL BE HELD FOR 5 DAYS BEFORE ISSUING A FINAL NEGATIVE REPORT   Report Status PENDING   Incomplete  CULTURE, BLOOD (ROUTINE X 2)     Status: None   Collection Time    03/25/12  5:55 PM      Result Value Range Status   Specimen Description BLOOD LEFT ARM   Final   Special Requests BOTTLES DRAWN AEROBIC AND ANAEROBIC   Final   Culture  Setup Time 03/25/2012 22:44   Final   Culture     Final   Value:        BLOOD CULTURE RECEIVED NO GROWTH TO DATE CULTURE WILL BE HELD FOR 5 DAYS BEFORE ISSUING A FINAL NEGATIVE REPORT   Report Status PENDING   Incomplete     Studies: No results found.  Scheduled Meds: . folic acid  1 mg Oral Daily  . isosorbide mononitrate  60 mg Oral Daily  . latanoprost  1 drop Both Eyes QHS  . losartan  50 mg Oral q morning - 10a  . metoprolol succinate  50 mg Oral QPM  . multivitamin with minerals  1 tablet Oral Daily  . Ocuvite PreserVision  1 tablet Oral BID  . pantoprazole  40 mg Oral Daily  . piperacillin-tazobactam (ZOSYN)  IV  3.375 g Intravenous Q8H  . simvastatin  20 mg Oral q1800  . thiamine  100 mg Oral Daily   Or  . thiamine  100 mg Intravenous Daily  . vancomycin  1,000 mg Intravenous BID   Continuous Infusions:      Time spent: 25 minutes    Deloyce Walthers  [facility] Pager [phone]. If 7PM-7AM, please contact night-coverage at [email or web address], password [facility] 03/28/2012, 2:24 PM  LOS: 3 days

## 2012-03-28 NOTE — Consult Note (Signed)
Hematoloty/ Oncology Consultation Date of visit 03-27-2012  Reason for Consult: pancytopenia Referring Physician: [facility]  MDs: [name] (PCP), Peter Swaziland, Ernesto Rutherford Dentist: Bubba Camp  [name] is an 78 y.o. male seen in consultation at request of hospitalist service for pancytopenia, which has persisted since admission on 03-25-12 and despite ongoing treatment of cat bite cellulitis. Primary care is by Dr [name] and he is followed by Dr Swaziland for cardiology. Last CBC in this EMR was Aug 2011 with WBC 3.8, plt 147k and hgb 15.3.  He had CBC also July 2008 with WBC 7.6, plt 168k and Hgb 13.5. He also had H/H April 2013 with Hgb 13.3. Other pertinent labs include CMET 03-26-12 with creatinine 1.0, T prot 6.1 and alb 3.0, normal LFTs; HIV negative; TSH WNL; and from Feb 2014 ANA negative and RF negative. He has had no imaging other than Xray of hand this admission; last 2 view CXR in this EMR April 2008 NAD post CABG; CT abdomen May 2005 with diffuse fatty infiltration of liver with normal spleen and other organs and no mention of adenopathy.  Dr Alwyn Ren has been PCP x 20+ years.   HPI:   Patient had been in generally good health, other than dental infection ~ 2 weeks prior, when he presented to PCP on 03-25-12 with pain and swelling right hand and streaking up right arm after cat bite the night of 03-24-12. He had chills at home after the cat bite. He was admitted to hospitalist service and begun on vancomycin and zosyn. CBC 03-25-2012 had WBC 0.9, Hgb 10.7 and plt 56K; counts have been in similar range since 3-13, including today with WBC 1.0, Hgb 9.4 and plt 51k, with ANC 0.3. Patient tells me that his right hand is not as painful today, not quite as swollen, and that streaking initially went above antecubital area but is now extending to upper forearm. In retrospect, he has not had quite as good energy as baseline for ~ a month, and felt as if he might have had  "low grade UTI" ~ 10 days PTA. The dental abscess developed rapidly with swelling of right lower face, panorex done at Dr Langdon's office and referral to oral surgery with root canal. He was on amoxicillin for the tooth, no excessive bleeding and full resolution of facial swelling and discomfort from that tooth, tho he has some new discomfort now with an adjacent tooth. Only bleeding has been minimal from nose when he blows, this unchanged for several years. He denies SOB or cardiac symptoms. He has had no other fever, weight is stable, appetite is good, no night sweats, no other pain. He had right cataract removed ~ 3 weeks ago with no problems, and is for left cataract extraction upcoming.    Review of Systems as above, also: No HA Some decreased hearing, chronic Seasonal allergies No respiratory problems No cardiac symptoms Appetite good. Some constipation in hospital only. Voiding ok No LE swelling No unusual bruising or rash. Minor arthritis No ETOH withdrawal symptoms since admission, on thiamine po.   Allergies:  Allergies  Allergen Reactions  . Clindamycin Other (See Comments)    UNKNOWN   Past Medical History  Diagnosis Date  . GERD (gastroesophageal reflux disease)   . Hypertension   . Hyperlipidemia   . Insomnia   . Hypertriglyceridemia   . History of atherosclerotic cardiovascular disease   . Bladder neck contracture   . Stable angina   . History of  myocardial infarction 2006   S/P CABG  . S/P CABG x 4 2006  . Coronary artery disease CARDIOLOGIST- DR Swaziland-  LAST VISIT NOTE 12-20-2010 IN EPIC AND W/ CHART    subendocranial MI; left inter. mamm to LAD, saph vein to diag; saph vein to distal circ; saph vein to 1st obtuse  . Arthritis   . H/O hiatal hernia   . History of kidney stones   . Glaucoma(365) BILATERAL  . Cataract immature BILATERAL  . Frequency of urination   . Urgency of urination   . Nocturia   . History of prostate cancer S/P PROSTATECTOMY-  NO  RECURRENCE    FOLLOWED BY DR Isabel Caprice  . Macular degeneration   . Raynaud's phenomenon     Past Surgical History  Procedure Laterality Date  . Debridement tennis elbow  2002    RIGHT; [facility]  . Robot assisted laparoscopic radical prostatectomy  05-11-2006  . Cardiac catheterization  07-30-2006  DR Swaziland    POST CABG / OCCLUDED DIAGONAL &  FOURTH OBTUSE MARGINAL GRAFTS/ NORMAL LVF/ PATENT LIMA TO LAD & SEPHENOUS VEIN GRAFT TO 1ST OBTUSE MARGINAL   . Cardiac catheterization  04-23-2004    CRITICAL THREE-VESSEL CAD/ PRESERVED LVF  . Laparoscopic cholecystectomy  02-05-2005  . Coronary artery bypass graft  04-25-2004  X4 VESSELS    left inter. mamm to LAD, saph vein to diag; saph vein to distal circ; saph vein to 1st obtuse  . Cystoscopy  04/30/2011    Procedure: CYSTOSCOPY;  Surgeon: Valetta Fuller, MD;  Location: [facility];  Service: Urology;  Laterality: N/A;  30 MINUTES   . Balloon dilation  04/30/2011    Procedure: BALLOON DILATION;  Surgeon: Valetta Fuller, MD;  Location[facility];  Service: Urology;  Laterality: N/A;  PMH/PSH otherwise: Radical prostatectomy by Dr Isabel Caprice 04-2006, last visit to Dr Isabel Caprice 6 mo ago CAD post CABG 2008 Nuc Med stress test by Dr Swaziland 02-17-2012: exercise x 5 min, normal study Raynaud's No history of anemia or other blood count problems known previously.   Family History  Problem Relation Age of Onset  . Heart disease Sister     CABGX 1 sister and stents X1 sister  . Cancer Sister   . Parkinsonism Father   . Diabetes Neg Hx   . Stroke Neg Hx    Sister living age 6, hx ovarian cancer. Sister died age 3. 2 sons healthy  Social History: originally from [place], in [place] since 1970s. 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  [employer] x 5 years, no chemical exposure. Married, lives with wife. Smoked mostly cigars until 2006. Reports 3-4 glasses of wine  daily (this is different than in EMR history).    Medications: reviewed in EMR. Begun on folic acid ? By Dr Swaziland, reason not known to patient. Other than amoxicillin for dental indication, no new or different medications in months. Not on prophylactic anticoagulation or ASA.   PHYSICAL EXAM Blood pressure 120/74, pulse 56, temperature 98 F (36.7 C), temperature source Oral, resp. rate 20, height 5\' 11"  (1.803 m), weight 205 lb (92.987 kg), SpO2 97.00%. Very pleasant man appears comfortable up in chair, good historian. He does not appear toxic or acutely ill. dRight hand bandaged.  HEENT: normal hair pattern. PERRL, not icteric. Oral mucosa moist without lesions. No facial swelling. Gums not swollen, no bleeding. Neck supple without JVD. Lymphatics: no  cervical or supraclavicular adenopathy Lungs without wheezes or rales. Respirations not labored RA Heart RRR Abdomen full, soft, not tender, cannot appreciate HSM. LE no edema, cords, tenderness. LUE not remarkable. RUE with dry dressing wrapping hand and wrist, puffy dorsum of hand above dressing with moderate erythema, fingers no erythema or swelling. Arm not swollen, dull erythema extending up almost to antecubital and more confluent than initial streaking per patient, not particularly tender and not hot, no cord apparent. Bruises in antecubital area without bleeding, no erythema in upper arm. IV left hand site ok.  Skin.otherwise no ecchymoses or petechiae Neuro other than some deafness nonfocal.   Results for orders placed during the hospital encounter of 03/25/12 (from the past 48 hour(s))  CBC WITH DIFFERENTIAL     Status: Abnormal   Collection Time    03/27/12  5:24 AM      Result Value Range   WBC 1.5 (*) 4.0 - 10.5 K/uL   RBC 2.97 (*) 4.22 - 5.81 MIL/uL   Hemoglobin 10.1 (*) 13.0 - 17.0 g/dL   HCT 82.9 (*) 56.2 - 13.0 %   MCV 98.0  78.0 - 100.0 fL   MCH 34.0  26.0 - 34.0 pg   MCHC 34.7  30.0 - 36.0 g/dL   RDW 86.5  78.4 -  69.6 %   Platelets 58 (*) 150 - 400 K/uL   Comment: CONSISTENT WITH PREVIOUS RESULT   Neutrophils Relative 34 (*) 43 - 77 %   Neutro Abs 0.5 (*) 1.7 - 7.7 K/uL   Lymphocytes Relative 66 (*) 12 - 46 %   Lymphs Abs 1.0  0.7 - 4.0 K/uL   Monocytes Relative 1 (*) 3 - 12 %   Monocytes Absolute 0.0 (*) 0.1 - 1.0 K/uL   Eosinophils Relative 0  0 - 5 %   Eosinophils Absolute 0.0  0.0 - 0.7 K/uL   Basophils Relative 0  0 - 1 %   Basophils Absolute 0.0  0.0 - 0.1 K/uL  CBC WITH DIFFERENTIAL     Status: Abnormal   Collection Time    03/28/12  4:10 AM      Result Value Range   WBC 1.0 (*) 4.0 - 10.5 K/uL   Comment: REPEATED TO VERIFY     CRITICAL RESULT CALLED TO, READ BACK BY AND VERIFIED WITH:     THORNE,E.RN @0507  03/28/12 WELLS,D.   RBC 2.78 (*) 4.22 - 5.81 MIL/uL   Hemoglobin 9.4 (*) 13.0 - 17.0 g/dL   HCT 29.5 (*) 28.4 - 13.2 %   MCV 96.8  78.0 - 100.0 fL   MCH 33.8  26.0 - 34.0 pg   MCHC 34.9  30.0 - 36.0 g/dL   RDW 44.0  10.2 - 72.5 %   Platelets 51 (*) 150 - 400 K/uL   Neutrophils Relative 26 (*) 43 - 77 %   Lymphocytes Relative 73 (*) 12 - 46 %   Monocytes Relative 1 (*) 3 - 12 %   Eosinophils Relative 0  0 - 5 %   Basophils Relative 0  0 - 1 %   Neutro Abs 0.3 (*) 1.7 - 7.7 K/uL   Lymphs Abs 0.7  0.7 - 4.0 K/uL   Monocytes Absolute 0.0 (*) 0.1 - 1.0 K/uL   Eosinophils Absolute 0.0  0.0 - 0.7 K/uL   Basophils Absolute 0.0  0.0 - 0.1 K/uL   RBC Morphology TEARDROP CELLS     WBC Morphology TOO FEW TO COUNT, SMEAR AVAILABLE FOR  REVIEW     Smear Review PENDING PATHOLOGIST REVIEW      Peripheral smear from 03-28-12 reviewed. RBCs show occasional fragmented cell and occasional burr cell, not microcytic and no spherocytes, no NRBCs. White cells markedly decreased with one seg, 2 lymphs and one monocyte seen, no immature cells apparent. Platelets are decreased and not large or giant. No rouleaux.  Blood cultures from 03-25-12 negative to date.   Assessment/Plan: 1.Pancytopenia:  tho no labs available in last couple of months, history suggests that low counts preceded present bite cellulitis, with neutropenia allowing rapid development of both recent dental abscess and extent of present cellulitis. No obvious medication or exposure etiology for the pancytopenia; he does drink daily and possibly this could be ETOH marrow suppression. Other etiologies obviously could be primary marrow abnormality, including MDS or leukemia. He needs bone marrow exam, which I have discussed with him and which I have requested be done with IR on 3-17 or as soon as possible (aspirate, biopsy, flow and cytometry). Would consider abdominal US or CT to look at liver and spleen. Continue neutropenic precautions, no gCSF until bone marrow exam done unless he becomes critically ill prior to the exam. Keep on bleeding precautions, but does not need platelet transfusion unless actively bleeding. Not symptomatic enough from this anemia to need PRBCs now. Would heme check stools and check iron/B12 with next labs drawn. Continue CBC/diff daily. If increased dental symptoms will need prompt attention there. 2.cat bite cellulitis right hand and arm: on broad spectrum antibiotics. Per history, a little better than on admission 3. CAD, previous MI and CABG  4.long past tobacco 5.history of prostate Ca known to Dr Isabel Caprice. No other information presently available, but apparently no active disease 6. Reports 3-4 glasses of wine daily.   Please call between my rounds if needed.  Sama Arauz P 03/28/2012, 10:18 AM

## 2012-03-28 NOTE — Progress Notes (Signed)
ANTIBIOTIC CONSULT NOTE - FOLLOW UP  Pharmacy Consult for Vancomycin Indication: Cellulitis  Allergies  Allergen Reactions  . Clindamycin Other (See Comments)    UNKNOWN   Patient Measurements: Height: 5\' 11"  (180.3 cm) Weight: 205 lb (92.987 kg) IBW/kg (Calculated) : 75.3  Vital Signs: Temp: 98 F (36.7 C) (03/16 0508) Temp src: Oral (03/16 0508) BP: 120/74 mmHg (03/16 0508) Pulse Rate: 56 (03/16 0508) Intake/Output from previous day: 03/15 0701 - 03/16 0700 In: 1120 [P.O.:540; IV Piggyback:580] Out: -  Intake/Output from this shift: Total I/O In: 120 [I.V.:70; IV Piggyback:50] Out: -   Labs:  Recent Labs  03/25/12 1750  03/26/12 0415 03/27/12 0524 03/28/12 0410  WBC 0.9*  < > 1.1* 1.5* 1.0*  HGB 10.7*  < > 9.5* 10.1* 9.4*  PLT 56*  < > 50* 58* 51*  CREATININE 1.08  --  1.00  --   --   < > = values in this interval not displayed. Estimated Creatinine Clearance: 74.4 ml/min (by C-G formula based on Cr of 1). No results found for this basename: VANCOTROUGH, Leodis Binet, VANCORANDOM, GENTTROUGH, GENTPEAK, GENTRANDOM, TOBRATROUGH, TOBRAPEAK, TOBRARND, AMIKACINPEAK, AMIKACINTROU, AMIKACIN,  in the last 72 hours   Microbiology: Recent Results (from the past 720 hour(s))  CULTURE, BLOOD (ROUTINE X 2)     Status: None   Collection Time    03/25/12  5:50 PM      Result Value Range Status   Specimen Description BLOOD LEFT ARM   Final   Special Requests BOTTLES DRAWN AEROBIC AND ANAEROBIC   Final   Culture  Setup Time 03/25/2012 22:44   Final   Culture     Final   Value:        BLOOD CULTURE RECEIVED NO GROWTH TO DATE CULTURE WILL BE HELD FOR 5 DAYS BEFORE ISSUING A FINAL NEGATIVE REPORT   Report Status PENDING   Incomplete  CULTURE, BLOOD (ROUTINE X 2)     Status: None   Collection Time    03/25/12  5:55 PM      Result Value Range Status   Specimen Description BLOOD LEFT ARM   Final   Special Requests BOTTLES DRAWN AEROBIC AND ANAEROBIC   Final   Culture   Setup Time 03/25/2012 22:44   Final   Culture     Final   Value:        BLOOD CULTURE RECEIVED NO GROWTH TO DATE CULTURE WILL BE HELD FOR 5 DAYS BEFORE ISSUING A FINAL NEGATIVE REPORT   Report Status PENDING   Incomplete    Anti-infectives   Start     Dose/Rate Route Frequency Ordered Stop   03/26/12 0600  piperacillin-tazobactam (ZOSYN) IVPB 3.375 g  Status:  Discontinued     3.375 g 12.5 mL/hr over 240 Minutes Intravenous 3 times per day 03/25/12 2035 03/25/12 2213   03/25/12 2300  piperacillin-tazobactam (ZOSYN) IVPB 3.375 g     3.375 g 12.5 mL/hr over 240 Minutes Intravenous 3 times per day 03/25/12 2213     03/25/12 2100  vancomycin (VANCOCIN) IVPB 1000 mg/200 mL premix     1,000 mg 200 mL/hr over 60 Minutes Intravenous 2 times daily 03/25/12 2026     03/25/12 2030  piperacillin-tazobactam (ZOSYN) IVPB 3.375 g  Status:  Discontinued     3.375 g 100 mL/hr over 30 Minutes Intravenous  Once 03/25/12 2000 03/25/12 2230   03/25/12 1900  Ampicillin-Sulbactam (UNASYN) 3 g in sodium chloride 0.9 % 100 mL IVPB  3 g 100 mL/hr over 60 Minutes Intravenous  Once 03/25/12 1848 03/25/12 2001     Assessment: 77 yom s/p cat bite 3/11, presented 3/13 with pain and swelling of R hand/arm along with fever and chills. Neutropenia noted on admission, no hx of myeloproliferative disorder.  Vancomycin & Unasyn on admission, usual pathogen of cat bite is Pasteurella multocida; antibiotic of choice is Augmentin. Unasyn changed to Zosyn for neutropenia.  Day 4 antibiotics, WBC decreased further (ANC 0.3, Plt 51)  Oncology consulted: monitor, plan for outpt workup unless neutropenia worsens.  No growth blood cultures  Goal of Therapy:  Vancomycin trough level 10-15 mcg/ml  Plan:  Vanc trough today Follow CBC, cultures, adjust Vanc as needed. No change Cleone Slim PharmD Pager [phone] 03/28/2012, 10:35 AM

## 2012-03-29 ENCOUNTER — Telehealth: Payer: Self-pay

## 2012-03-29 ENCOUNTER — Inpatient Hospital Stay (HOSPITAL_COMMUNITY): Payer: Medicare Other

## 2012-03-29 ENCOUNTER — Encounter (HOSPITAL_COMMUNITY): Payer: Self-pay | Admitting: Radiology

## 2012-03-29 DIAGNOSIS — D72819 Decreased white blood cell count, unspecified: Secondary | ICD-10-CM

## 2012-03-29 LAB — IRON AND TIBC
Iron: 60 ug/dL (ref 42–135)
Saturation Ratios: 25 % (ref 20–55)
TIBC: 244 ug/dL (ref 215–435)

## 2012-03-29 LAB — CBC WITH DIFFERENTIAL/PLATELET
Basophils Relative: 0 % (ref 0–1)
Eosinophils Relative: 1 % (ref 0–5)
Hemoglobin: 10.1 g/dL — ABNORMAL LOW (ref 13.0–17.0)
Lymphocytes Relative: 72 % — ABNORMAL HIGH (ref 12–46)
Monocytes Relative: 1 % — ABNORMAL LOW (ref 3–12)
Neutrophils Relative %: 26 % — ABNORMAL LOW (ref 43–77)
Platelets: 56 10*3/uL — ABNORMAL LOW (ref 150–400)
RBC: 3.02 MIL/uL — ABNORMAL LOW (ref 4.22–5.81)
WBC: 1.1 10*3/uL — CL (ref 4.0–10.5)

## 2012-03-29 LAB — PROTIME-INR
INR: 1.19 (ref 0.00–1.49)
Prothrombin Time: 14.9 seconds (ref 11.6–15.2)

## 2012-03-29 LAB — APTT: aPTT: 34 seconds (ref 24–37)

## 2012-03-29 MED ORDER — FENTANYL CITRATE 0.05 MG/ML IJ SOLN
INTRAMUSCULAR | Status: AC | PRN
  Administered 2012-03-29: 25 ug via INTRAVENOUS
  Administered 2012-03-29: 50 ug via INTRAVENOUS
  Administered 2012-03-29: 25 ug via INTRAVENOUS
  Administered 2012-03-29: 50 ug via INTRAVENOUS

## 2012-03-29 MED ORDER — MIDAZOLAM HCL 2 MG/2ML IJ SOLN
INTRAMUSCULAR | Status: AC | PRN
  Administered 2012-03-29 (×3): 0.5 mg via INTRAVENOUS

## 2012-03-29 NOTE — Procedures (Signed)
Technically successful CT guided bone marrow aspiration and biopsy of left iliac crest. No immediate complications. Awaiting pathology report.    

## 2012-03-29 NOTE — H&P (Signed)
HPI: [name] is an 78 y.o. male with cat bite to his right hand admitted for antibiotics but is also found to have pancytopenia. He is in process of workup and BM bx is requested by hematology. PMHx and meds reviewed. He is feeling better and has had improvement in his right hand since admission.   Past Medical History:  Past Medical History  Diagnosis Date  . GERD (gastroesophageal reflux disease)   . Hypertension   . Hyperlipidemia   . Insomnia   . Hypertriglyceridemia   . History of atherosclerotic cardiovascular disease   . Bladder neck contracture   . Stable angina   . History of myocardial infarction 2006   S/P CABG  . S/P CABG x 4 2006  . Coronary artery disease CARDIOLOGIST- DR [place]-  LAST VISIT NOTE 12-20-2010 IN EPIC AND W/ CHART    subendocranial MI; left inter. mamm to LAD, saph vein to diag; saph vein to distal circ; saph vein to 1st obtuse  . Arthritis   . H/O hiatal hernia   . History of kidney stones   . Glaucoma(365) BILATERAL  . Cataract immature BILATERAL  . Frequency of urination   . Urgency of urination   . Nocturia   . History of prostate cancer S/P PROSTATECTOMY-  NO RECURRENCE    FOLLOWED BY DR Isabel Caprice  . Macular degeneration   . Raynaud's phenomenon     Past Surgical History:  Past Surgical History  Procedure Laterality Date  . Debridement tennis elbow  2002    RIGHT; [facility]  . Robot assisted laparoscopic radical prostatectomy  05-11-2006  . Cardiac catheterization  07-30-2006  DR [place]    POST CABG / OCCLUDED DIAGONAL &  FOURTH OBTUSE MARGINAL GRAFTS/ NORMAL LVF/ PATENT LIMA TO LAD & SEPHENOUS VEIN GRAFT TO 1ST OBTUSE MARGINAL   . Cardiac catheterization  04-23-2004    CRITICAL THREE-VESSEL CAD/ PRESERVED LVF  . Laparoscopic cholecystectomy  02-05-2005  . Coronary artery bypass graft  04-25-2004  X4 VESSELS    left inter. mamm to LAD, saph vein to diag; saph vein to distal circ; saph vein to 1st obtuse  . Cystoscopy  04/30/2011     Procedure: CYSTOSCOPY;  Surgeon: Valetta Fuller, MD;  Location: [facility];  Service: Urology;  Laterality: N/A;  30 MINUTES   . Balloon dilation  04/30/2011    Procedure: BALLOON DILATION;  Surgeon: Valetta Fuller, MD;  Location: [facility];  Service: Urology;  Laterality: N/A;    Family History:  Family History  Problem Relation Age of Onset  . Heart disease Sister     CABGX 1 sister and stents X1 sister  . Cancer Sister   . Parkinsonism Father   . Diabetes Neg Hx   . Stroke Neg Hx     Social History:  reports that he has quit smoking. His smoking use included Cigarettes and Cigars. He smoked 0.00 packs per day for 3 years. He has never used smokeless tobacco. He reports that he drinks about 8.4 ounces of alcohol per week. He reports that he does not use illicit drugs.  Allergies:  Allergies  Allergen Reactions  . Clindamycin Other (See Comments)    UNKNOWN    Medications: Medications Prior to Admission  Medication Sig Dispense Refill  . brimonidine (ALPHAGAN) 0.2 % ophthalmic solution Place 1 drop into both eyes 2 (two) times daily.      [name] ibuprofen (ADVIL,MOTRIN) 200 MG tablet Take 400 mg  by mouth every 6 (six) hours as needed for pain.      . isosorbide mononitrate (IMDUR) 60 MG 24 hr tablet Take 1 tablet (60 mg total) by mouth daily.  90 tablet  3  . latanoprost (XALATAN) 0.005 % ophthalmic solution Place 1 drop into both eyes at bedtime.       [name] losartan (COZAAR) 50 MG tablet Take 1 tablet (50 mg total) by mouth every morning.  30 tablet  1  . metoprolol succinate (TOPROL-XL) 50 MG 24 hr tablet Take 1 tablet (50 mg total) by mouth every evening.  90 tablet  3  . Multiple Vitamin (MULTIVITAMIN) tablet Take 1 tablet by mouth daily.       . Multiple Vitamins-Minerals (PRESERVISION AREDS PO) Take 1 tablet by mouth 2 (two) times daily.       . nitroGLYCERIN (NITROSTAT) 0.4 MG SL tablet Place 1 tablet (0.4 mg total) under the tongue every 5  (five) minutes as needed for chest pain.  100 tablet  3  . omeprazole (PRILOSEC) 20 MG capsule Take 20 mg by mouth every morning.       . simvastatin (ZOCOR) 40 MG tablet Take 0.5 tablets (20 mg total) by mouth daily.  30 tablet  6  . aspirin 81 MG tablet Take 81 mg by mouth daily.         Please HPI for pertinent positives, otherwise complete 10 system ROS negative.  Physical Exam: Blood pressure 153/91, pulse 46, temperature 98 F (36.7 C), temperature source Oral, resp. rate 16, height 5\' 11"  (1.803 m), weight 205 lb (92.987 kg), SpO2 97.00%. Body mass index is 28.6 kg/(m^2).   General Appearance:  Alert, cooperative, no distress, appears stated age  Head:  Normocephalic, without obvious abnormality, atraumatic  ENT: Unremarkable  Neck: Supple, symmetrical, trachea midline, no adenopathy, thyroid: not enlarged, symmetric, no tenderness/mass/nodules  Lungs:   Clear to auscultation bilaterally, no w/r/r, respirations unlabored without use of accessory muscles.  Chest Wall:  No tenderness or deformity  Heart:  Regular rate and rhythm, S1, S2 normal, no murmur, rub or gallop. Carotids 2+ without bruit.  Neurologic: Normal affect, no gross deficits.   Results for orders placed during the hospital encounter of 03/25/12 (from the past 48 hour(s))  CBC WITH DIFFERENTIAL     Status: Abnormal   Collection Time    03/28/12  4:10 AM      Result Value Range   WBC 1.0 (*) 4.0 - 10.5 K/uL   Comment: REPEATED TO VERIFY     CRITICAL RESULT CALLED TO, READ BACK BY AND VERIFIED WITH:     THORNE,E.RN @0507  03/28/12 WELLS,D.   RBC 2.78 (*) 4.22 - 5.81 MIL/uL   Hemoglobin 9.4 (*) 13.0 - 17.0 g/dL   HCT 91.4 (*) 78.2 - 95.6 %   MCV 96.8  78.0 - 100.0 fL   MCH 33.8  26.0 - 34.0 pg   MCHC 34.9  30.0 - 36.0 g/dL   RDW 21.3  08.6 - 57.8 %   Platelets 51 (*) 150 - 400 K/uL   Neutrophils Relative 26 (*) 43 - 77 %   Lymphocytes Relative 73 (*) 12 - 46 %   Monocytes Relative 1 (*) 3 - 12 %   Eosinophils  Relative 0  0 - 5 %   Basophils Relative 0  0 - 1 %   Neutro Abs 0.3 (*) 1.7 - 7.7 K/uL   Lymphs Abs 0.7  0.7 - 4.0 K/uL  Monocytes Absolute 0.0 (*) 0.1 - 1.0 K/uL   Eosinophils Absolute 0.0  0.0 - 0.7 K/uL   Basophils Absolute 0.0  0.0 - 0.1 K/uL   RBC Morphology TEARDROP CELLS     WBC Morphology TOO FEW TO COUNT, SMEAR AVAILABLE FOR REVIEW     Smear Review PENDING PATHOLOGIST REVIEW    VANCOMYCIN, TROUGH     Status: Abnormal   Collection Time    03/28/12  9:00 PM      Result Value Range   Vancomycin Tr <5.0 (*) 10.0 - 20.0 ug/mL  CBC WITH DIFFERENTIAL     Status: Abnormal   Collection Time    03/29/12  4:11 AM      Result Value Range   WBC 1.1 (*) 4.0 - 10.5 K/uL   Comment: REPEATED TO VERIFY     CRITICAL VALUE NOTED.  VALUE IS CONSISTENT WITH PREVIOUSLY REPORTED AND CALLED VALUE.   RBC 3.02 (*) 4.22 - 5.81 MIL/uL   Hemoglobin 10.1 (*) 13.0 - 17.0 g/dL   HCT 16.1 (*) 09.6 - 04.5 %   MCV 97.7  78.0 - 100.0 fL   MCH 33.4  26.0 - 34.0 pg   MCHC 34.2  30.0 - 36.0 g/dL   RDW 40.9  81.1 - 91.4 %   Platelets 56 (*) 150 - 400 K/uL   Comment: REPEATED TO VERIFY     CONSISTENT WITH PREVIOUS RESULT   Neutrophils Relative 26 (*) 43 - 77 %   Lymphocytes Relative 72 (*) 12 - 46 %   Monocytes Relative 1 (*) 3 - 12 %   Eosinophils Relative 1  0 - 5 %   Basophils Relative 0  0 - 1 %   Neutro Abs 0.3 (*) 1.7 - 7.7 K/uL   Lymphs Abs 0.8  0.7 - 4.0 K/uL   Monocytes Absolute 0.0 (*) 0.1 - 1.0 K/uL   Eosinophils Absolute 0.0  0.0 - 0.7 K/uL   Basophils Absolute 0.0  0.0 - 0.1 K/uL   RBC Morphology TEARDROP CELLS     WBC Morphology ATYPICAL LYMPHOCYTES     No results found.  Assessment/Plan Pancytopenia For CT guided BM biopsy today Discussed procedure, risks, complications. Labs reviewed. Consent signed in chart  Brayton El PA-C 03/29/2012, 9:54 AM

## 2012-03-29 NOTE — Progress Notes (Signed)
  03/29/2012, 9:16 AM  Hospital day: 5 Antibiotics: day 5 vanc/zosyn  Patient seen in continuing attention to his pancytopenia. He is scheduled for bone marrow exam by IR ~ 1100 today.   Subjective: Right hand and arm feel a little better, otherwise no new or different problems. Same minimal discomfort at tooth adjacent to the tooth with recent root canal, which he tells me has been going on for several years. No bleeding. No shaking chills with temp 10.9 last pm. He is tired of sitting. He was able to sleep last pm.  Objective: Vital signs in last 24 hours: Blood pressure 153/91, pulse 46, temperature 98 F (36.7 C), temperature source Oral, resp. rate 16, height 5\' 11"  (1.803 m), weight 205 lb (92.987 kg), SpO2 97.00%.   Intake/Output from previous day: 03/16 0701 - 03/17 0700 In: 1188 [P.O.:780; I.V.:258; IV Piggyback:150] Out: -  Intake/Output this shift:    Physical exam: awake, alert, looks comfortable seated in recliner on RA.  PERRL. Lungs clear, heart RRR rates 46-87 on flow sheets. Abdomen soft, not tender, some BS. LE no edema, cords, tenderness. Peripheral IV LUE site ok, no bleeding. RUE slight improvement in extent of erythema up forearm, and less swelling across top of hand above dressing. Only ecchymoses are stable at right antecubital, no petechiae on feet/ ankles. Affect appropriate, cooperative, not tremulous.  Lab Results:  Recent Labs  03/28/12 0410 03/29/12 0411  WBC 1.0* 1.1*  HGB 9.4* 10.1*  HCT 26.9* 29.5*  PLT 51* 56*  segs 265, lymphs 72%, monos 0, some teardrops and atypical lymphs called.  Blood cultures x 2 03-25-12 negative to date. Urine culture in process Iron, B12 in process PT/PTT ordered now  Studies/Results: No results found. Abdominal US for evaluation of liver and spleen, including spleen size, ordered and pending  Assessment/Plan: 1. Pancytopenia with leukopenia/ neutropenia, normocytic anemia and thrombocytopenia: suspect primary  marrow problem, vs secondary marrow suppression from ETOH or other. Appreciate IR doing bone marrow exam today, including aspirate, biopsy, and flow/cytogenetics if possible. I LM also with bone marrow lab about this yesterday. Clinically he appears stable this AM. 2.Right hand and arm cellulitis from cat bite: showing some improvement even with neutropenia 3.dental abscess within past month, with root canal done 4.CAD, hx MI and post CABG 5.long past tobacco 6.history of prostate ca, followed by Dr Isabel Caprice. I doubt this is related to the hematologic problems 7.daily ETOH  Would prefer not to give gCSF at least until after bone marrow is done, tho could begin with one dose 300 mcg after procedure today if he is febrile again or if any concerns that infection is worse. If he has significant bleeding after the procedure, would transfuse platelets.   LIVESAY,LENNIS P [phone]

## 2012-03-29 NOTE — Progress Notes (Signed)
[facility] PROGRESS NOTE  [name] ZOX:[id number] DOB: 1934/07/14 DOA: 03/25/2012 PCP: [name], MD Brief narrative 78 year-old male with history of cornea artery disease status post CABG, hyperlipidemia, hypertension who was admitted for having a cat bite on his right hand. It was also noted to be pancytopenic.   Assessment/Plan  Cellulitis of the right hand  Secondary to cat bite. Patient given a dose of Unasyn in the ED but changed to IV Zosyn given pancytopenia. Added vancomycin on admission. Dr. Izora Ribas (hand surgeon) was consulted who evaluated the patient and irrigated some wound out . Recommend hand elevation and minimal use of the right hand.. Blood cultures negative  Had low grade temp yesterday but swelling is better. Monitor for any worsening of swelling. X-ray on admission unremarkable.   Pancytopenia  -No clear etiology. Continues to be pancytopenic. ANC <300 today,. Patient likely had a low cell counts which was worsened by recent infection. Appreciate hematology consult.  bone marrow biopsy, flow cytometry done today . Ultrasound abdomen shows normal liver with scattered hepatic cysts.  -has history of prostate cancer and has been stable for past 6 months. Follows with Dr. Isabel Caprice .  -iron and B12 wnl  CAD  Status post CABG. Holding aspirin given thrombocytopenia   Hypertension  Continue home medications   Hyperlipidemia  Stable. Continue home medications   DVT prophylaxis: SCD  Code Status: Full code   Family Communication: None at bedside  Disposition Plan: Home tomorrow if cellulitis improved with outpatient hematology follow up Consultants:  Hand surgeon (Dr. Izora Ribas)  Procedures:  None Antibiotics:  IV vancomycin and Zosyn (day 3)  HPI/Subjective:  No overnight issues. Remains afebrile   Objective: Filed Vitals:   03/29/12 1220 03/29/12 1250 03/29/12 1320 03/29/12 1415  BP: 139/78 139/81 160/74 145/75  Pulse: 62 57 70 56  Temp:  97.7  F (36.5 C)    TempSrc:  Oral    Resp: 20 18 20 18   Height:      Weight:      SpO2: 95% 97% 97% 96%    Intake/Output Summary (Last 24 hours) at 03/29/12 1549 Last data filed at 03/29/12 1407  Gross per 24 hour  Intake   1048 ml  Output      0 ml  Net   1048 ml   Filed Weights   03/25/12 1952 03/25/12 2201  Weight: 92.5 kg (203 lb 14.8 oz) 92.987 kg (205 lb)    Exam:  General: Elderly male lying in bed in no acute distress  HEENT: No pallor, moist oral mucosa  Cardiovascular: Normal S1 and S2, no murmurs rub or gallop  Respiratory: Clear to auscultation bilaterally, no added sounds  Abdomen: Soft, nontender, nondistended, bowel sounds present  Musculoskeletal: right hand Swelling much  improved. No tenderness . Better finger grasp and erythema over forearm improved as well CNS: AAO x3   Data Reviewed: Basic Metabolic Panel:  Recent Labs Lab 03/25/12 1750 03/26/12 0415  NA 131* 135  K 4.1 3.7  CL 97 103  CO2 22 23  GLUCOSE 126* 141*  BUN 17 14  CREATININE 1.08 1.00  CALCIUM 8.8 8.6   Liver Function Tests:  Recent Labs Lab 03/26/12 0415  AST 11  ALT 12  ALKPHOS 53  BILITOT 0.6  PROT 6.1  ALBUMIN 3.0*   No results found for this basename: LIPASE, AMYLASE,  in the last 168 hours No results found for this basename: AMMONIA,  in the last 168 hours CBC:  Recent Labs Lab 03/25/12 1958 03/26/12 0415 03/27/12 0524 03/28/12 0410 03/29/12 0411  WBC 0.9* 1.1* 1.5* 1.0* 1.1*  NEUTROABS 0.5* 0.4* 0.5* 0.3* 0.3*  HGB 10.0* 9.5* 10.1* 9.4* 10.1*  HCT 29.1* 28.0* 29.1* 26.9* 29.5*  MCV 98.0 98.2 98.0 96.8 97.7  PLT 52* 50* 58* 51* 56*   Cardiac Enzymes: No results found for this basename: CKTOTAL, CKMB, CKMBINDEX, TROPONINI,  in the last 168 hours BNP (last 3 results) No results found for this basename: PROBNP,  in the last 8760 hours CBG: No results found for this basename: GLUCAP,  in the last 168 hours  Recent Results (from the past 240 hour(s))   CULTURE, BLOOD (ROUTINE X 2)     Status: None   Collection Time    03/25/12  5:50 PM      Result Value Range Status   Specimen Description BLOOD LEFT ARM   Final   Special Requests BOTTLES DRAWN AEROBIC AND ANAEROBIC   Final   Culture  Setup Time 03/25/2012 22:44   Final   Culture     Final   Value:        BLOOD CULTURE RECEIVED NO GROWTH TO DATE CULTURE WILL BE HELD FOR 5 DAYS BEFORE ISSUING A FINAL NEGATIVE REPORT   Report Status PENDING   Incomplete  CULTURE, BLOOD (ROUTINE X 2)     Status: None   Collection Time    03/25/12  5:55 PM      Result Value Range Status   Specimen Description BLOOD LEFT ARM   Final   Special Requests BOTTLES DRAWN AEROBIC AND ANAEROBIC   Final   Culture  Setup Time 03/25/2012 22:44   Final   Culture     Final   Value:        BLOOD CULTURE RECEIVED NO GROWTH TO DATE CULTURE WILL BE HELD FOR 5 DAYS BEFORE ISSUING A FINAL NEGATIVE REPORT   Report Status PENDING   Incomplete     Studies: US Abdomen Complete  03/29/2012  *RADIOLOGY REPORT*  Clinical Data:  Pancytopenia.  COMPLETE ABDOMINAL ULTRASOUND  Comparison:  None.  Findings:  Gallbladder:  Removed.  Common bile duct:  Measures 0.4 cm.  Liver:  The liver demonstrates increased echogenicity.  Scattered cysts are identified.  Most of these are subcentimeter in size of but a lesion in the right hepatic lobe measures 7.1 cm in diameter.  IVC:  Appears normal.  Pancreas:  No focal abnormality seen.  Spleen:  Measures 10.3 cm with a volume of 288.4 ml.  No focal lesion.  Right Kidney:  Measures 11.3 cm and appears normal.  Left Kidney:  Measures 12.3 cm and appears normal.  Abdominal aorta:  No aneurysm identified.  IMPRESSION:  1.  No acute abnormality. 2.  Hepatic cysts.   Original Report Authenticated By: Holley Dexter, M.D.    Ct Biopsy  03/29/2012  *RADIOLOGY REPORT*  Indication: Pancytopenia of unknown etiology, evaluate for leukemia versus MDS.  CT GUIDED LEFT ILIAC BONE MARROW ASPIRATION AND  BONE MARROW CORE BIOPSIES  Intravenous medications: Fentanyl 150 mcg IV; Versed 2 mg IV  Sedation time: 9 minutes  Contrast volume: None  Complications: None immediate  PROCEDURE/FINDINGS:  Informed consent was obtained from the patient following an explanation of the procedure, risks, benefits and alternatives. The patient understands, agrees and consents for the procedure. All questions were addressed.  A time out was performed prior to the initiation of the procedure.  The patient was positioned  prone and noncontrast localization CT was performed of the pelvis to demonstrate the iliac marrow spaces.  The operative site was prepped and draped in the usual sterile fashion.  Under sterile conditions and local anesthesia, an 11 gauge coaxial bone biopsy needle was advanced into the left iliac marrow space. Needle position was confirmed with CT imaging.  Initially, bone marrow aspiration was performed. Next, a bone marrow biopsy was obtained with the 11 gauge outer bone marrow device. The 11 gauge coaxial bone biopsy needle was readvanced into a slightly different location within the left iliac marrow space, positioning was confirmed and an additional bone marrow aspiration and biopsy was obtained.  Samples were prepared with the cytotechnologist and deemed adequate.  The needle was removed intact.  Hemostasis was obtained with compression and a dressing was placed. The patient tolerated the procedure well without immediate post procedural complication.  IMPRESSION:  Successful CT guided left iliac bone marrow aspiration and core biopsies.   Original Report Authenticated By: Tacey Ruiz, MD     Scheduled Meds: . folic acid  1 mg Oral Daily  . isosorbide mononitrate  60 mg Oral Daily  . latanoprost  1 drop Both Eyes QHS  . losartan  50 mg Oral q morning - 10a  . metoprolol succinate  50 mg Oral QPM  . multivitamin with minerals  1 tablet Oral Daily  . Ocuvite PreserVision  1 tablet Oral BID  . pantoprazole   40 mg Oral Daily  . piperacillin-tazobactam (ZOSYN)  IV  3.375 g Intravenous Q8H  . simvastatin  20 mg Oral q1800  . thiamine  100 mg Oral Daily   Or  . thiamine  100 mg Intravenous Daily  . vancomycin  1,250 mg Intravenous Q12H   Continuous Infusions:     Time spent: 25 minutes    Eddie North  [facility] Page [phone]. If 7PM-7AM, please contact night-coverage at [email or web address], password [facility] 03/29/2012, 3:49 PM  LOS: 4 days

## 2012-03-29 NOTE — Telephone Encounter (Signed)
Ms. [name] called to let Dr. Darrold Span Know that the only Lab work done by Dr. Dione Booze prior to Surgery on 02-23-12 was a glucose =119.

## 2012-03-29 NOTE — Progress Notes (Signed)
Medical Oncology  Spoke with Dr Alton Revere office and [facility] to [facility] for possible blood work done prior to recent cataract surgery. [facility] will check his chart and be back in touch with my office, however likely only checked glucose if any lab done.  Ila Mcgill, MD

## 2012-03-30 LAB — BASIC METABOLIC PANEL
BUN: 13 mg/dL (ref 6–23)
Chloride: 101 mEq/L (ref 96–112)
Creatinine, Ser: 1.12 mg/dL (ref 0.50–1.35)
GFR calc Af Amer: 72 mL/min — ABNORMAL LOW (ref >90)
GFR calc non Af Amer: 62 mL/min — ABNORMAL LOW (ref >90)
Potassium: 3.7 mEq/L (ref 3.5–5.1)

## 2012-03-30 LAB — CBC WITH DIFFERENTIAL/PLATELET
Basophils Relative: 0 % (ref 0–1)
Eosinophils Absolute: 0 10*3/uL (ref 0.0–0.7)
Eosinophils Relative: 0 % (ref 0–5)
Hemoglobin: 9.3 g/dL — ABNORMAL LOW (ref 13.0–17.0)
Lymphocytes Relative: 83 % — ABNORMAL HIGH (ref 12–46)
Monocytes Absolute: 0 10*3/uL — ABNORMAL LOW (ref 0.1–1.0)
Neutrophils Relative %: 17 % — ABNORMAL LOW (ref 43–77)
Platelets: 48 10*3/uL — ABNORMAL LOW (ref 150–400)
RBC: 2.78 MIL/uL — ABNORMAL LOW (ref 4.22–5.81)

## 2012-03-30 LAB — URINE CULTURE: Culture: NO GROWTH

## 2012-03-30 MED ORDER — FILGRASTIM 300 MCG/ML IJ SOLN
300.0000 ug | Freq: Once | INTRAMUSCULAR | Status: AC
  Administered 2012-03-30: 300 ug via SUBCUTANEOUS
  Filled 2012-03-30: qty 1

## 2012-03-30 NOTE — Progress Notes (Signed)
S:hand feels much better, still being evaluated for pancytopenia.  O:Blood pressure 138/64, pulse 70, temperature 97.9 F (36.6 C), temperature source Oral, resp. rate 18, height 5\' 11"  (1.803 m), weight 92.987 kg (205 lb), SpO2 96.00%. Results for orders placed during the hospital encounter of 03/25/12  CULTURE, BLOOD (ROUTINE X 2)     Status: None   Collection Time    03/25/12  5:50 PM      Result Value Range Status   Specimen Description BLOOD LEFT ARM   Final   Special Requests BOTTLES DRAWN AEROBIC AND ANAEROBIC   Final   Culture  Setup Time 03/25/2012 22:44   Final   Culture     Final   Value:        BLOOD CULTURE RECEIVED NO GROWTH TO DATE CULTURE WILL BE HELD FOR 5 DAYS BEFORE ISSUING A FINAL NEGATIVE REPORT   Report Status PENDING   Incomplete  CULTURE, BLOOD (ROUTINE X 2)     Status: None   Collection Time    03/25/12  5:55 PM      Result Value Range Status   Specimen Description BLOOD LEFT ARM   Final   Special Requests BOTTLES DRAWN AEROBIC AND ANAEROBIC   Final   Culture  Setup Time 03/25/2012 22:44   Final   Culture     Final   Value:        BLOOD CULTURE RECEIVED NO GROWTH TO DATE CULTURE WILL BE HELD FOR 5 DAYS BEFORE ISSUING A FINAL NEGATIVE REPORT   Report Status PENDING   Incomplete  URINE CULTURE     Status: None   Collection Time    03/28/12  4:10 AM      Result Value Range Status   Specimen Description URINE, RANDOM   Final   Culture DUPLICATE REQUEST SEE [id number]   Final   Report Status 03/30/2012 FINAL   Final  URINE CULTURE     Status: None   Collection Time    03/28/12  5:00 PM      Result Value Range Status   Specimen Description URINE, CLEAN CATCH   Final   Special Requests vancomycin, zosyn Immunocompromised   Final   Culture  Setup Time 03/28/2012 21:18   Final   Colony Count NO GROWTH   Final   Culture NO GROWTH   Final   Report Status 03/30/2012 FINAL   Final   R hand: decreased erythema, small fluid collection, erythema at dorsal  metacarpal level - previous i&d site closed  A:s/p i&d R hand cat bite, not with residual fluid collection   P: repeat i&d performed at bedside - local anesthetic used, small amount of purulence obtained, repacked with iodoform gauze, will remove and resume irrigations in am - may f/u in office when medically cleared

## 2012-03-30 NOTE — Progress Notes (Signed)
CRITICAL VALUE ALERT  Critical value received:  WBC  Date of notification:  03/18  Time of notification:  0510  Critical value read back:yes  Nurse who received alert:  Dorris Fetch  MD notified (1st page):  Kirtland Bouchard Schorr  Time of first page:  0515  MD notified (2nd page): K. Schorr    Time of second [id number]  Responding MD:  N/A   Time MD responded: N/A

## 2012-03-30 NOTE — Progress Notes (Signed)
03/30/2012, 8:38 AM  Hospital day: 6 Antibiotics: day 6 vanc/zosyn   Subjective: Up in chair, no significant problems and no fever overnight. Tolerated the bone marrow exam without difficulty, a little sore there but no bleeding. Right hand more sore last night, better with prn pain med, overall better since admission. No dental symptoms. No other bleeding. Denies SOB up in room. Eating normally.  Objective: Vital signs in last 24 hours: Blood pressure 122/74, pulse 58, temperature 97.8 F (36.6 C), temperature source Oral, resp. rate 16, height 5\' 11"  (1.803 m), weight 205 lb (92.987 kg), SpO2 95.00%.   Intake/Output from previous day: 03/17 0701 - 03/18 0700 In: 2248 [P.O.:1080; I.V.:568; IV Piggyback:600] Out: -  Intake/Output this shift:    Physical exam: Alert, looks comfortable up in recliner, easily mobile. PERRL, not icteric. No jaw swelling. Lungs clear, heart RRR. No erythema now on right forearm, no swelling. Bruising in right antecubital some better. Area of the bite adjacent to right thumb on dorsum of hand with no drainage or bleeding from puncture site, surrounding erythema but no dark areas, erythema across metacarpophalangeal heads with minimal puffiness there, but no significant erythema or swelling on dorsum of hand otherwise. Abdomen soft, nontender. Bone marrow site no bleeding or bruising, dressing removed. LE no edema, cords.  Lab Results:  Recent Labs  03/29/12 0411 03/30/12 0423  WBC 1.1* 1.1*  HGB 10.1* 9.3*  HCT 29.5* 27.1*  PLT 56* 48*  ANC 0.2 today BMET  Recent Labs  03/30/12 0423  NA 134*  K 3.7  CL 101  CO2 23  GLUCOSE 110*  BUN 13  CREATININE 1.12  CALCIUM 8.5    Studies/Results: US Abdomen Complete  03/29/2012  *RADIOLOGY REPORT*  Clinical Data:  Pancytopenia.  COMPLETE ABDOMINAL ULTRASOUND  Comparison:  None.  Findings:  Gallbladder:  Removed.  Common bile duct:  Measures 0.4 cm.  Liver:  The liver demonstrates increased  echogenicity.  Scattered cysts are identified.  Most of these are subcentimeter in size of but a lesion in the right hepatic lobe measures 7.1 cm in diameter.  IVC:  Appears normal.  Pancreas:  No focal abnormality seen.  Spleen:  Measures 10.3 cm with a volume of 288.4 ml.  No focal lesion.  Right Kidney:  Measures 11.3 cm and appears normal.  Left Kidney:  Measures 12.3 cm and appears normal.  Abdominal aorta:  No aneurysm identified.  IMPRESSION:  1.  No acute abnormality. 2.  Hepatic cysts.   Original Report Authenticated By: Holley Dexter, M.D.    Ct Biopsy  03/29/2012  *RADIOLOGY REPORT*  Indication: Pancytopenia of unknown etiology, evaluate for leukemia versus MDS.  CT GUIDED LEFT ILIAC BONE MARROW ASPIRATION AND BONE MARROW CORE BIOPSIES  Intravenous medications: Fentanyl 150 mcg IV; Versed 2 mg IV  Sedation time: 9 minutes  Contrast volume: None  Complications: None immediate  PROCEDURE/FINDINGS:  Informed consent was obtained from the patient following an explanation of the procedure, risks, benefits and alternatives. The patient understands, agrees and consents for the procedure. All questions were addressed.  A time out was performed prior to the initiation of the procedure.  The patient was positioned prone and noncontrast localization CT was performed of the pelvis to demonstrate the iliac marrow spaces.  The operative site was prepped and draped in the usual sterile fashion.  Under sterile conditions and local anesthesia, an 11 gauge coaxial bone biopsy needle was advanced into the left iliac marrow space. Needle position was confirmed  with CT imaging.  Initially, bone marrow aspiration was performed. Next, a bone marrow biopsy was obtained with the 11 gauge outer bone marrow device. The 11 gauge coaxial bone biopsy needle was readvanced into a slightly different location within the left iliac marrow space, positioning was confirmed and an additional bone marrow aspiration and biopsy was  obtained.  Samples were prepared with the cytotechnologist and deemed adequate.  The needle was removed intact.  Hemostasis was obtained with compression and a dressing was placed. The patient tolerated the procedure well without immediate post procedural complication.  IMPRESSION:  Successful CT guided left iliac bone marrow aspiration and core biopsies.   Original Report Authenticated By: Tacey Ruiz, MD     PATHOLOGY Pending now from bone marrow exam done 03-29-12. Hopefully we will get at least some preliminary information today.  It is medically necessary for him to stay in hospital on IV antibiotics until situation with pancytopenia is clarified and addressed. Patient understands and agrees.  Assessment/Plan: 1. Pancytopenia with leukopenia/ neutropenia, normocytic anemia and thrombocytopenia: bone marrow exam done 03-29-12 and path pending 2.Right hand and arm cellulitis from cat bite: showing some improvement even with neutropenia. With neutropenia need to continue IV antibiotics in hospital now. 3.dental abscess within past month, with root canal done  4.CAD, hx MI and post CABG  5.long past tobacco  6.history of prostate ca, followed by Dr Isabel Caprice. I doubt this is related to the hematologic problems  7.daily ETOH  Please call if questions. I will f/u bone marrow information later today.  Ashlynd Michna P [phone]

## 2012-03-30 NOTE — Progress Notes (Signed)
Medical Oncology  See full note from this AM also.  Discussed bone marrow with Dr Jimmy Picket, who is working on case now:  Hypercellular marrow with infiltrate of what may be lymphocytes, to extent that there was almost no material in the aspirate. Morphologically this may be low grade lymphoma or possibly could be hairy cell, but flow and immunostains are all pending and peripheral smear does not give other conclusive information. He does have a few megakaryocytes seen.  I have discussed this preliminary information with patient in person now, and have answered his questions to his satisfaction for the information available now. His hand seems to be better even since this AM; he wants to walk in hall as he feels he is getting weak from sitting in room, will wear mask.   As I expect we will need to begin some treatment for the hematologic problem fairly promptly after diagnosis is established, it is especially important that he continue IV antibiotics in hospital for now in order to resolve the bite cellulitis as quickly as possible. After we get further pathology information tomorrow, we can decide better about DC options.   Please call if you need to discuss.  Thanks  L.Darrold Span, MD [phone]

## 2012-03-30 NOTE — Progress Notes (Signed)
[facility] PROGRESS NOTE  [name] JYN:[id number] DOB: March 05, 1934 DOA: 03/25/2012 PCP: [name], MD  Brief narrative  78 year-old male with history of cornea artery disease status post CABG, hyperlipidemia, hypertension who was admitted for having a cat bite on his right hand. Patient also noted to be pancytopenic.   Assessment/Plan  Cellulitis of the right hand  Secondary to cat bite. Patient given a dose of Unasyn in the ED but changed to IV Zosyn given pancytopenia. Added vancomycin on admission. Dr. Izora Ribas (hand surgeon) was consulted who evaluated the patient and irrigated some wound out . Recommend hand elevation and minimal use of the right hand.. Blood cultures negative  Remains afebrile and cellulitis improving. We'll continue bank and Zosyn for now.  Pancytopenia  -No clear etiology. Date with ANC around 300.[name] Appreciate hematology consult. Bone marrow biopsy and flow cytometric done. As per hematology preliminary result showing hypercellular marrow with infiltration of possible lymphocytes. Pending final results.  Ultrasound abdomen shows normal liver with scattered hepatic cysts.  -has history of prostate cancer and has been stable for past 6 months. Follows with Dr. Isabel Caprice .  -iron and B12 wnl.  CAD  Status post CABG. Holding aspirin given thrombocytopenia   Hypertension  Continue home medications   Hyperlipidemia  Stable. Continue home medications   DVT prophylaxis: SCD  Code Status: Full code  Family Communication: None at bedside  Disposition Plan:  pending final bone marrow results and decision on treatment options  Consultants:  Hand surgeon (Dr. Izora Ribas)  Dr. Dorie Rank ( hematology)   Procedures:  Bone marrow bx on 3/17 Antibiotics:  IV vancomycin and Zosyn (day 5)    HPI/Subjective: Patient seen and examined this morning. Overnight issues  Objective: Filed Vitals:   03/29/12 1320 03/29/12 1415 03/29/12 2110 03/30/12 0625  BP: 160/74  145/75 108/72 122/74  Pulse: 70 56 66 58  Temp:   98.7 F (37.1 C) 97.8 F (36.6 C)  TempSrc:   Oral Oral  Resp: 20 18 16 16   Height:      Weight:      SpO2: 97% 96% 96% 95%    Intake/Output Summary (Last 24 hours) at 03/30/12 1437 Last data filed at 03/30/12 1000  Gross per 24 hour  Intake   1668 ml  Output      0 ml  Net   1668 ml   Filed Weights   03/25/12 1952 03/25/12 2201  Weight: 92.5 kg (203 lb 14.8 oz) 92.987 kg (205 lb)    Exam:  General: Elderly male lying in bed in no acute distress  HEENT: No pallor, moist oral mucosa  Cardiovascular: Normal S1 and S2, no murmurs rub or gallop  Respiratory: Clear to auscultation bilaterally, no added sounds  Abdomen: Soft, nontender, nondistended, bowel sounds present  Musculoskeletal: right hand Swelling much improved. Minimal tenderness. Better finger grasp and erythema over forearm much improved as well. CNS: AAO x3   Data Reviewed: Basic Metabolic Panel:  Recent Labs Lab 03/25/12 1750 03/26/12 0415 03/30/12 0423  NA 131* 135 134*  K 4.1 3.7 3.7  CL 97 103 101  CO2 22 23 23   GLUCOSE 126* 141* 110*  BUN 17 14 13   CREATININE 1.08 1.00 1.12  CALCIUM 8.8 8.6 8.5   Liver Function Tests:  Recent Labs Lab 03/26/12 0415  AST 11  ALT 12  ALKPHOS 53  BILITOT 0.6  PROT 6.1  ALBUMIN 3.0*   No results found for this basename: LIPASE, AMYLASE,  in the last 168 hours No results found for this basename: AMMONIA,  in the last 168 hours CBC:  Recent Labs Lab 03/26/12 0415 03/27/12 0524 03/28/12 0410 03/29/12 0411 03/30/12 0423  WBC 1.1* 1.5* 1.0* 1.1* 1.1*  NEUTROABS 0.4* 0.5* 0.3* 0.3* 0.2*  HGB 9.5* 10.1* 9.4* 10.1* 9.3*  HCT 28.0* 29.1* 26.9* 29.5* 27.1*  MCV 98.2 98.0 96.8 97.7 97.5  PLT 50* 58* 51* 56* 48*   Cardiac Enzymes: No results found for this basename: CKTOTAL, CKMB, CKMBINDEX, TROPONINI,  in the last 168 hours BNP (last 3 results) No results found for this basename: PROBNP,  in the last  8760 hours CBG: No results found for this basename: GLUCAP,  in the last 168 hours  Recent Results (from the past 240 hour(s))  CULTURE, BLOOD (ROUTINE X 2)     Status: None   Collection Time    03/25/12  5:50 PM      Result Value Range Status   Specimen Description BLOOD LEFT ARM   Final   Special Requests BOTTLES DRAWN AEROBIC AND ANAEROBIC   Final   Culture  Setup Time 03/25/2012 22:44   Final   Culture     Final   Value:        BLOOD CULTURE RECEIVED NO GROWTH TO DATE CULTURE WILL BE HELD FOR 5 DAYS BEFORE ISSUING A FINAL NEGATIVE REPORT   Report Status PENDING   Incomplete  CULTURE, BLOOD (ROUTINE X 2)     Status: None   Collection Time    03/25/12  5:55 PM      Result Value Range Status   Specimen Description BLOOD LEFT ARM   Final   Special Requests BOTTLES DRAWN AEROBIC AND ANAEROBIC   Final   Culture  Setup Time 03/25/2012 22:44   Final   Culture     Final   Value:        BLOOD CULTURE RECEIVED NO GROWTH TO DATE CULTURE WILL BE HELD FOR 5 DAYS BEFORE ISSUING A FINAL NEGATIVE REPORT   Report Status PENDING   Incomplete  URINE CULTURE     Status: None   Collection Time    03/28/12  4:10 AM      Result Value Range Status   Specimen Description URINE, RANDOM   Final   Culture DUPLICATE REQUEST SEE [id number]   Final   Report Status 03/30/2012 FINAL   Final  URINE CULTURE     Status: None   Collection Time    03/28/12  5:00 PM      Result Value Range Status   Specimen Description URINE, CLEAN CATCH   Final   Special Requests vancomycin, zosyn Immunocompromised   Final   Culture  Setup Time 03/28/2012 21:18   Final   Colony Count NO GROWTH   Final   Culture NO GROWTH   Final   Report Status 03/30/2012 FINAL   Final     Studies: US Abdomen Complete  03/29/2012  *RADIOLOGY REPORT*  Clinical Data:  Pancytopenia.  COMPLETE ABDOMINAL ULTRASOUND  Comparison:  None.  Findings:  Gallbladder:  Removed.  Common bile duct:  Measures 0.4 cm.  Liver:  The liver demonstrates  increased echogenicity.  Scattered cysts are identified.  Most of these are subcentimeter in size of but a lesion in the right hepatic lobe measures 7.1 cm in diameter.  IVC:  Appears normal.  Pancreas:  No focal abnormality seen.  Spleen:  Measures 10.3 cm with a volume of  288.4 ml.  No focal lesion.  Right Kidney:  Measures 11.3 cm and appears normal.  Left Kidney:  Measures 12.3 cm and appears normal.  Abdominal aorta:  No aneurysm identified.  IMPRESSION:  1.  No acute abnormality. 2.  Hepatic cysts.   Original Report Authenticated By: Holley Dexter, M.D.    Ct Biopsy  03/29/2012  *RADIOLOGY REPORT*  Indication: Pancytopenia of unknown etiology, evaluate for leukemia versus MDS.  CT GUIDED LEFT ILIAC BONE MARROW ASPIRATION AND BONE MARROW CORE BIOPSIES  Intravenous medications: Fentanyl 150 mcg IV; Versed 2 mg IV  Sedation time: 9 minutes  Contrast volume: None  Complications: None immediate  PROCEDURE/FINDINGS:  Informed consent was obtained from the patient following an explanation of the procedure, risks, benefits and alternatives. The patient understands, agrees and consents for the procedure. All questions were addressed.  A time out was performed prior to the initiation of the procedure.  The patient was positioned prone and noncontrast localization CT was performed of the pelvis to demonstrate the iliac marrow spaces.  The operative site was prepped and draped in the usual sterile fashion.  Under sterile conditions and local anesthesia, an 11 gauge coaxial bone biopsy needle was advanced into the left iliac marrow space. Needle position was confirmed with CT imaging.  Initially, bone marrow aspiration was performed. Next, a bone marrow biopsy was obtained with the 11 gauge outer bone marrow device. The 11 gauge coaxial bone biopsy needle was readvanced into a slightly different location within the left iliac marrow space, positioning was confirmed and an additional bone marrow aspiration and biopsy  was obtained.  Samples were prepared with the cytotechnologist and deemed adequate.  The needle was removed intact.  Hemostasis was obtained with compression and a dressing was placed. The patient tolerated the procedure well without immediate post procedural complication.  IMPRESSION:  Successful CT guided left iliac bone marrow aspiration and core biopsies.   Original Report Authenticated By: Tacey Ruiz, MD     Scheduled Meds: . filgrastim  300 mcg Subcutaneous Once  . folic acid  1 mg Oral Daily  . isosorbide mononitrate  60 mg Oral Daily  . latanoprost  1 drop Both Eyes QHS  . losartan  50 mg Oral q morning - 10a  . metoprolol succinate  50 mg Oral QPM  . multivitamin with minerals  1 tablet Oral Daily  . Ocuvite PreserVision  1 tablet Oral BID  . pantoprazole  40 mg Oral Daily  . piperacillin-tazobactam (ZOSYN)  IV  3.375 g Intravenous Q8H  . simvastatin  20 mg Oral q1800  . thiamine  100 mg Oral Daily   Or  . thiamine  100 mg Intravenous Daily  . vancomycin  1,250 mg Intravenous Q12H   Continuous Infusions:     Time spent: 25 minutes    Kaesha Kirsch  [facility] Pager [phone] If 7PM-7AM, please contact night-coverage at [email or web address], password [facility] 03/30/2012, 2:37 PM  LOS: 5 days

## 2012-03-31 ENCOUNTER — Telehealth: Payer: Self-pay

## 2012-03-31 DIAGNOSIS — IMO0002 Reserved for concepts with insufficient information to code with codable children: Secondary | ICD-10-CM

## 2012-03-31 DIAGNOSIS — Z5189 Encounter for other specified aftercare: Secondary | ICD-10-CM

## 2012-03-31 DIAGNOSIS — S61459A Open bite of unspecified hand, initial encounter: Secondary | ICD-10-CM

## 2012-03-31 DIAGNOSIS — C9140 Hairy cell leukemia not having achieved remission: Secondary | ICD-10-CM

## 2012-03-31 LAB — CBC WITH DIFFERENTIAL/PLATELET
Basophils Absolute: 0 10*3/uL (ref 0.0–0.1)
Eosinophils Absolute: 0 10*3/uL (ref 0.0–0.7)
HCT: 27 % — ABNORMAL LOW (ref 39.0–52.0)
Lymphocytes Relative: 68 % — ABNORMAL HIGH (ref 12–46)
MCHC: 34.8 g/dL (ref 30.0–36.0)
Monocytes Absolute: 0 10*3/uL — ABNORMAL LOW (ref 0.1–1.0)
Neutrophils Relative %: 30 % — ABNORMAL LOW (ref 43–77)
RDW: 14.9 % (ref 11.5–15.5)
WBC: 1.1 10*3/uL — CL (ref 4.0–10.5)

## 2012-03-31 LAB — VANCOMYCIN, TROUGH: Vancomycin Tr: 17.8 ug/mL (ref 10.0–20.0)

## 2012-03-31 LAB — CULTURE, BLOOD (ROUTINE X 2)

## 2012-03-31 MED ORDER — VANCOMYCIN HCL IN DEXTROSE 1-5 GM/200ML-% IV SOLN
1000.0000 mg | Freq: Two times a day (BID) | INTRAVENOUS | Status: DC
  Filled 2012-03-31: qty 200

## 2012-03-31 MED ORDER — ACYCLOVIR 200 MG PO CAPS
200.0000 mg | ORAL_CAPSULE | Freq: Three times a day (TID) | ORAL | Status: DC
  Administered 2012-03-31 – 2012-04-02 (×5): 200 mg via ORAL
  Filled 2012-03-31 (×7): qty 1

## 2012-03-31 NOTE — Telephone Encounter (Signed)
Told [name] that Dr. Darrold Span will be over to see him in the hospital ~5-530 pm today to discuss the information she has received concerning test.  Pt. Verbalized understanding.

## 2012-03-31 NOTE — Progress Notes (Signed)
[facility] PROGRESS NOTE  [name] LKG:[id number] DOB: 06-Sep-1934 DOA: 03/25/2012 PCP: [name], MD  Assessment/Plan: Cellulitis of the right hand  -Secondary to cat bite.  -Patient given a dose of Unasyn in the ED but changed to IV Zosyn given pancytopenia. Added vancomycin on admission. Dr. Izora Ribas (hand surgeon) was consulted who evaluated the patient and irrigated some wound out (3/14 and 3/18) at bedside . Recommended hand elevation and minimal use of the right hand.. Blood cultures negative  -Remains afebrile and cellulitis improving. -Discontinue vancomycin -Continue Zosyn -Patient received tetanus vaccination at his PCPs office prior to admission Pancytopenia  - ANC around 300 - Appreciate hematology followup. Bone marrow biopsy and flow cytometric done. As per hematology preliminary result showing hypercellular marrow with infiltration of possible lymphocytes. 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  -has history of prostate cancer and has been stable for past 6 months. Follows with Dr. Isabel Caprice .  -iron and B12 wnl.  Hairy cell leukemia -Management per Dr. Darrold Span CAD  Status post CABG. Holding aspirin given thrombocytopenia  Hypertension  Continue home medications  Hyperlipidemia  Stable. Continue home medications      Family Communication:   Pt at beside Disposition Plan:   Home when cleared by [facility]   Procedures:  Bone marrow biopsy--03/29/2012  Antibiotics:  Vancomycin 03/25/2012>>> 03/31/2012  Zosyn 03/25/2012>>>    Procedures/Studies: US Abdomen Complete  03/29/2012  *RADIOLOGY REPORT*  Clinical Data:  Pancytopenia.  COMPLETE ABDOMINAL ULTRASOUND  Comparison:  None.  Findings:  Gallbladder:  Removed.  Common bile duct:  Measures 0.4 cm.  Liver:  The liver demonstrates increased echogenicity.  Scattered cysts are identified.  Most of these are  subcentimeter in size of but a lesion in the right hepatic lobe measures 7.1 cm in diameter.  IVC:  Appears normal.  Pancreas:  No focal abnormality seen.  Spleen:  Measures 10.3 cm with a volume of 288.4 ml.  No focal lesion.  Right Kidney:  Measures 11.3 cm and appears normal.  Left Kidney:  Measures 12.3 cm and appears normal.  Abdominal aorta:  No aneurysm identified.  IMPRESSION:  1.  No acute abnormality. 2.  Hepatic cysts.   Original Report Authenticated By: Holley Dexter, M.D.    Ct Biopsy  03/29/2012  *RADIOLOGY REPORT*  Indication: Pancytopenia of unknown etiology, evaluate for leukemia versus MDS.  CT GUIDED LEFT ILIAC BONE MARROW ASPIRATION AND BONE MARROW CORE BIOPSIES  Intravenous medications: Fentanyl 150 mcg IV; Versed 2 mg IV  Sedation time: 9 minutes  Contrast volume: None  Complications: None immediate  PROCEDURE/FINDINGS:  Informed consent was obtained from the patient following an explanation of the procedure, risks, benefits and alternatives. The patient understands, agrees and consents for the procedure. All questions were addressed.  A time out was performed prior to the initiation of the procedure.  The patient was positioned prone and noncontrast localization CT was performed of the pelvis to demonstrate the iliac marrow spaces.  The operative site was prepped and draped in the usual sterile fashion.  Under sterile conditions and local anesthesia, an 11 gauge coaxial bone biopsy needle was advanced into the left iliac marrow space. Needle position was confirmed with CT imaging.  Initially, bone marrow aspiration was performed. Next, a bone marrow biopsy was obtained with the 11 gauge outer bone marrow device. The 11 gauge coaxial bone biopsy needle was readvanced into a slightly different location within the  left iliac marrow space, positioning was confirmed and an additional bone marrow aspiration and biopsy was obtained.  Samples were prepared with the cytotechnologist and deemed  adequate.  The needle was removed intact.  Hemostasis was obtained with compression and a dressing was placed. The patient tolerated the procedure well without immediate post procedural complication.  IMPRESSION:  Successful CT guided left iliac bone marrow aspiration and core biopsies.   Original Report Authenticated By: Tacey Ruiz, MD    Dg Hand Complete Right  03/25/2012  *RADIOLOGY REPORT*  Clinical Data: Recurrent skin infections  RIGHT HAND - COMPLETE 3+ VIEW  Comparison: None.  Findings: Negative for fracture.  Negative for arthropathy.  No foreign body or soft tissue swelling.  IMPRESSION: Negative   Original Report Authenticated By: Janeece Riggers, M.D.          Subjective: Patient is feeling better. His pain is controlled. Denies fevers, chills, chest pain, shortness breath, nausea, vomiting, diarrhea, abdominal pain, dysuria, hematuria.  Objective: Filed Vitals:   03/30/12 1400 03/30/12 2150 03/31/12 0611 03/31/12 1456  BP: 138/64 112/66 104/75 106/60  Pulse: 70 74 63 58  Temp: 97.9 F (36.6 C) 98.4 F (36.9 C) 98.1 F (36.7 C) 97.6 F (36.4 C)  TempSrc: Oral Oral Oral Oral  Resp: 18 16 18 18   Height:      Weight:      SpO2: 96% 96% 96% 96%    Intake/Output Summary (Last 24 hours) at 03/31/12 1652 Last data filed at 03/31/12 1336  Gross per 24 hour  Intake 2388.33 ml  Output      0 ml  Net 2388.33 ml   Weight change:  Exam:   General:  Pt is alert, follows commands appropriately, not in acute distress  HEENT: No icterus, No thrush,  [place]/AT  Cardiovascular: RRR, S1/S2, no rubs, no gallops  Respiratory: CTA bilaterally, no wheezing, no crackles, no rhonchi  Abdomen: Soft/+BS, non tender, non distended, no guarding  Extremities: Right hand without any lymphangitic streaking. The distal first metacarpal area on the right hand with packing--no necrosis, no pus, no crepitance, no streaking  Data Reviewed: Basic Metabolic Panel:  Recent Labs Lab  03/25/12 1750 03/26/12 0415 03/30/12 0423  NA 131* 135 134*  K 4.1 3.7 3.7  CL 97 103 101  CO2 22 23 23   GLUCOSE 126* 141* 110*  BUN 17 14 13   CREATININE 1.08 1.00 1.12  CALCIUM 8.8 8.6 8.5   Liver Function Tests:  Recent Labs Lab 03/26/12 0415  AST 11  ALT 12  ALKPHOS 53  BILITOT 0.6  PROT 6.1  ALBUMIN 3.0*   No results found for this basename: LIPASE, AMYLASE,  in the last 168 hours No results found for this basename: AMMONIA,  in the last 168 hours CBC:  Recent Labs Lab 03/27/12 0524 03/28/12 0410 03/29/12 0411 03/30/12 0423 03/31/12 0530  WBC 1.5* 1.0* 1.1* 1.1* 1.1*  NEUTROABS 0.5* 0.3* 0.3* 0.2* 0.3*  HGB 10.1* 9.4* 10.1* 9.3* 9.4*  HCT 29.1* 26.9* 29.5* 27.1* 27.0*  MCV 98.0 96.8 97.7 97.5 97.5  PLT 58* 51* 56* 48* 42*   Cardiac Enzymes: No results found for this basename: CKTOTAL, CKMB, CKMBINDEX, TROPONINI,  in the last 168 hours BNP: No components found with this basename: POCBNP,  CBG: No results found for this basename: GLUCAP,  in the last 168 hours  Recent Results (from the past 240 hour(s))  CULTURE, BLOOD (ROUTINE X 2)     Status: None  Collection Time    03/25/12  5:50 PM      Result Value Range Status   Specimen Description BLOOD LEFT ARM   Final   Special Requests BOTTLES DRAWN AEROBIC AND ANAEROBIC   Final   Culture  Setup Time 03/25/2012 22:44   Final   Culture NO GROWTH 5 DAYS   Final   Report Status 03/31/2012 FINAL   Final  CULTURE, BLOOD (ROUTINE X 2)     Status: None   Collection Time    03/25/12  5:55 PM      Result Value Range Status   Specimen Description BLOOD LEFT ARM   Final   Special Requests BOTTLES DRAWN AEROBIC AND ANAEROBIC   Final   Culture  Setup Time 03/25/2012 22:44   Final   Culture NO GROWTH 5 DAYS   Final   Report Status 03/31/2012 FINAL   Final  URINE CULTURE     Status: None   Collection Time    03/28/12  4:10 AM      Result Value Range Status   Specimen Description URINE, RANDOM   Final    Culture DUPLICATE REQUEST SEE [id number]   Final   Report Status 03/30/2012 FINAL   Final  URINE CULTURE     Status: None   Collection Time    03/28/12  5:00 PM      Result Value Range Status   Specimen Description URINE, CLEAN CATCH   Final   Special Requests vancomycin, zosyn Immunocompromised   Final   Culture  Setup Time 03/28/2012 21:18   Final   Colony Count NO GROWTH   Final   Culture NO GROWTH   Final   Report Status 03/30/2012 FINAL   Final     Scheduled Meds: . folic acid  1 mg Oral Daily  . isosorbide mononitrate  60 mg Oral Daily  . latanoprost  1 drop Both Eyes QHS  . losartan  50 mg Oral q morning - 10a  . metoprolol succinate  50 mg Oral QPM  . multivitamin with minerals  1 tablet Oral Daily  . Ocuvite PreserVision  1 tablet Oral BID  . pantoprazole  40 mg Oral Daily  . piperacillin-tazobactam (ZOSYN)  IV  3.375 g Intravenous Q8H  . simvastatin  20 mg Oral q1800  . thiamine  100 mg Oral Daily   Or  . thiamine  100 mg Intravenous Daily  . vancomycin  1,000 mg Intravenous Q12H   Continuous Infusions:    Zuleica Seith, DO  [facility] Pager [phone]  If 7PM-7AM, please contact night-coverage [email or web address] Password [email or web address] 03/31/2012, 4:52 PM   LOS: 6 days

## 2012-03-31 NOTE — Progress Notes (Signed)
ANTIBIOTIC CONSULT NOTE - FOLLOW UP  Pharmacy Consult for Vancomycin/Zosyn Indication: cellulitis  Allergies  Allergen Reactions  . Clindamycin Other (See Comments)    UNKNOWN    Patient Measurements: Height: 5\' 11"  (180.3 cm) Weight: 205 lb (92.987 kg) IBW/kg (Calculated) : 75.3  Vital Signs: Temp: 98.1 F (36.7 C) (03/19 0611) Temp src: Oral (03/19 0611) BP: 104/75 mmHg (03/19 0611) Pulse Rate: 63 (03/19 0611)  Labs:  Recent Labs  03/29/12 0411 03/30/12 0423 03/31/12 0530  WBC 1.1* 1.1* 1.1*  HGB 10.1* 9.3* 9.4*  PLT 56* 48* 42*  CREATININE  --  1.12  --    Estimated Creatinine Clearance: 66.4 ml/min (by C-G formula based on Cr of 1.12).  Recent Labs  03/28/12 2100 03/31/12 0530  VANCOTROUGH <5.0* 17.8     Microbiology: 3/13 blood x2: NGTD 3/16 urine: NGF   Assessment:  77 YOM with cellulitis of right hand secondary from cat bite.  Patient was started on Unasyn but switched to Zosyn given pancytopenia.  Day #7 vancomycin 1250 mg IV q12h and Zosyn 3.375g IV q8h (4 hour infusion time).  Pt remains neutropenic (WBC 1.1, ANC 0.3), afebrile.  Hematology following with bone marrow biopsy results pending.  Per MD note, cellulitis improving.  Renal function has been relatively stable.  Vancomycin trough checked and was above therapeutic goal for cellulitis.  Goal of Therapy:  Vancomycin trough level 10-15 mcg/ml  Plan:  Reduce vancomycin to 1g IV q12h. Continue Zosyn 3.375g IV q8h (4 hour infusion time). Check SCr in AM.  Clance Boll 03/31/2012,7:56 AM

## 2012-03-31 NOTE — Progress Notes (Signed)
03/31/2012, 6:24 PM  Hospital day: 7 Antibiotics: vancomycin, zosyn   Met with wife and patient x 35 min now, to discuss bone marrow findings of hairy cell leukemia. Earlier today I discussed with pathologist and reviewed treatment protocols with pharmacist. We have discussed this rare diagnosis, possible complications related to low blood counts, historical treatments and use of 2CDA now including different regimen options, possible consultation or referral to university if he prefers, need to resolve present cellulitis hopefully prior to starting chemotherapy.    Subjective: Feeling generally well today, including walking in hall with mask. Not much discomfort in right hand, no bleeding, no chills, no shortness of breath or other obvious symptoms with the anemia. Area of puncture was drained and irrigated by Dr Izora Ribas yesterday, tho I do not believe any culture sent.  Objective: Vital signs in last 24 hours: Blood pressure 144/64, pulse 65, temperature 97.6 F (36.4 C), temperature source Oral, resp. rate 18, height 5\' 11"  (1.803 m), weight 205 lb (92.987 kg), SpO2 96.00%.   Intake/Output from previous day: 03/18 0701 - 03/19 0700 In: 1946.7 [P.O.:1320; I.V.:226.7; IV Piggyback:400] Out: -  Intake/Output this shift: Total I/O In: 720 [P.O.:720] Out: -   Physical exam: alert, oriented and appropriate, looks comfortable in chair on RA. Area of the puncture base of thumb has erythema ~ 3cm diameter, no drainage. Less erythema across metacarpophalangeal joints and no swelling there now, no erythema up forearm.   Lab Results:  Recent Labs  03/30/12 0423 03/31/12 0530  WBC 1.1* 1.1*  HGB 9.3* 9.4*  HCT 27.1* 27.0*  PLT 48* 42*   BMET  Recent Labs  03/30/12 0423  NA 134*  K 3.7  CL 101  CO2 23  GLUCOSE 110*  BUN 13  CREATININE 1.12  CALCIUM 8.5    Studies/Results: Accession #: [id number]  03-30-2012 Interpretation Bone Marrow Flow Cytometry FLOW CYTOMETRY OF THIS  BONE MARROW SPECIMEN SHOWS A LAMBDA RESTRICTED MONOCLONAL B CELL POPULATION WHICH IS POSITIVE FOR CD19, CD20, CD22, CD11C, CD25, CD103 AND FMC-7. THE MORPHOLOGIC FEATURES SHOW AN ATYPICAL MONONUCLEAR POPULATION AND IN CONJUNCTION WITH THE IMMUNOPHENOTYPE, FINDINGS ARE MOST CONSISTENT WITH MARROW INVOLVEMENT BY HAIRY CELL LEUKEMIA. CASED DISCUSSED WITH DR. Darrold Span ON 03/30/12 AND 03/31/12. (JDP:GT, 03/31/12) Jimmy Picket MD Pathologist, Electronic Signature (Case signed 03/31/2012) GROSS AND MICROSCOPIC INFORMATION Source Bone Marrow Flow Cytometry Microscopic Gated population: Flow cytometric immunophenotyping is performed using antibiodies to the antigens listed in the table below. Electronic gates are placed around a cell cluster displaying light scatter properties corresponding to lymphocytes. - Abnormal Cells in gated population: 24 % - Phenotype of Abnormal Cells: CD11c, CD19, CD20, CD22, CD25, CD103, FMC7, HLA-Dr, Lambda  Assessment/Plan: 1.Hairy cell leukemia: Pancytopenic tho no splenomegaly by [place] this admission. Recent dental abscess and present cellulitis from cat bite worse due to neutropenia, but fortunately dental abscess resolved and the cellulitis is improving remarkable well given the degree of neutropenia. He prefers that I try to discuss with [facility] prior to making any referral, which I will try to do in AM, and we will try to finalize treatment plans quickly. I do think he should continue present IV antibiotics at least another couple of days, until hopefully this cellulitis is completely resolved. (We discussed home IV antibiotics, however the zosyn is q 6 hrs and the vancomycin dosing is requiring adjustment, so really best in this situation including neutropenia to be in hospital). Weekly 2CDA x 6 may be better choice for him than continuous  infusion x 7 days, due to pancytopenia. 2. Hx MI, CABG x4, HTN, elevated lipids 3.hx prostate cancer followed by Dr  Isabel Caprice 4.post unilateral cataract extraction: will need to wait until hairy cell leukemia treatment is complete for other eye surgery 5.glaucoma 6.hx nephrolitiasis  Patient and wife followed the discussion well and are in agreement with plan above.  If he needs to move off of present unit, he should be transferred to 3rd floor oncology unit.  Ceceilia Cephus P

## 2012-04-01 ENCOUNTER — Other Ambulatory Visit: Payer: Self-pay | Admitting: Oncology

## 2012-04-01 ENCOUNTER — Encounter: Payer: Self-pay | Admitting: *Deleted

## 2012-04-01 ENCOUNTER — Telehealth: Payer: Self-pay | Admitting: Oncology

## 2012-04-01 DIAGNOSIS — C9140 Hairy cell leukemia not having achieved remission: Secondary | ICD-10-CM

## 2012-04-01 DIAGNOSIS — I2510 Atherosclerotic heart disease of native coronary artery without angina pectoris: Secondary | ICD-10-CM

## 2012-04-01 DIAGNOSIS — L089 Local infection of the skin and subcutaneous tissue, unspecified: Secondary | ICD-10-CM

## 2012-04-01 LAB — BASIC METABOLIC PANEL
BUN: 11 mg/dL (ref 6–23)
Calcium: 9 mg/dL (ref 8.4–10.5)
Chloride: 106 mEq/L (ref 96–112)
Creatinine, Ser: 1.18 mg/dL (ref 0.50–1.35)
GFR calc Af Amer: 68 mL/min — ABNORMAL LOW (ref >90)
GFR calc non Af Amer: 59 mL/min — ABNORMAL LOW (ref >90)

## 2012-04-01 LAB — CBC WITH DIFFERENTIAL/PLATELET
Basophils Absolute: 0 10*3/uL (ref 0.0–0.1)
Eosinophils Absolute: 0 10*3/uL (ref 0.0–0.7)
HCT: 28.2 % — ABNORMAL LOW (ref 39.0–52.0)
Lymphocytes Relative: 61 % — ABNORMAL HIGH (ref 12–46)
MCHC: 34.8 g/dL (ref 30.0–36.0)
Neutro Abs: 0.5 10*3/uL — ABNORMAL LOW (ref 1.7–7.7)
Platelets: 42 10*3/uL — ABNORMAL LOW (ref 150–400)
RDW: 15 % (ref 11.5–15.5)
WBC: 1.4 10*3/uL — CL (ref 4.0–10.5)

## 2012-04-01 NOTE — Progress Notes (Signed)
Patient currently admitted. Plans for discharge on Friday 04/02/12. Patient scheduled for 1st chemo on Monday 04/05/12 with weekly Cladribine. Dr Darrold Span has arranged for Elvis Coil, inpatient educator to give chemo education to patient before discharge since no chemo education classes are available before patient starts chemo as outpatient. We can expand on our [facility] education materials on Monday when patient is here for chemo. Consent to be obtained at that time.

## 2012-04-01 NOTE — Progress Notes (Signed)
[facility] PROGRESS NOTE  [name] WUJ:[id number] DOB: [birth year] DOA: 03/25/2012 PCP: [name], MD  Assessment/Plan: Cellulitis of the right hand/Cat bite -Secondary to cat bite.  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 Added vancomycin on admission. Dr. Izora Ribas (hand surgeon) was consulted who evaluated the patient and irrigated some wound out (3/14 and 3/18) at bedside . Recommended hand elevation and minimal use of the right hand.  Blood cultures negative  -Remains afebrile and cellulitis improving.  -Discontinued  vancomycin (03/31/12) -Continue Zosyn  -Patient received tetanus vaccination at his PCPs office prior to admission  Pancytopenia  - ANC 504 - Appreciate hematology followup. Bone marrow biopsy (3/17) and flow cytometric done.  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  -has history of prostate cancer and has been stable for past 6 months. Follows with Dr. Isabel Caprice .  -iron and B12 wnl.  Hairy cell leukemia  -Management per Dr. Darrold Span  CAD  Restart ASA if okay with Dr. Darrold Span Status post CABG. Holding aspirin given thrombocytopenia  Hypertension  Continue metoprolol succinate -d/c losartan due to soft BP Hyperlipidemia  Stable. Continue home medications    Family Communication:   Pt at beside Disposition Plan:   Home vs transfer to tertiary med center  Procedures:  Bone marrow biopsy--03/29/2012 Antibiotics:  Vancomycin 03/25/2012>>> 03/31/2012  Zosyn 03/25/2012>>>       Procedures/Studies: US Abdomen Complete  03/29/2012  *RADIOLOGY REPORT*  Clinical Data:  Pancytopenia.  COMPLETE ABDOMINAL ULTRASOUND  Comparison:  None.   Findings:  Gallbladder:  Removed.  Common bile duct:  Measures 0.4 cm.  Liver:  The liver demonstrates increased echogenicity.  Scattered cysts are identified.  Most of these are subcentimeter in size of but a lesion in the right hepatic lobe measures 7.1 cm in diameter.  IVC:  Appears normal.  Pancreas:  No focal abnormality seen.  Spleen:  Measures 10.3 cm with a volume of 288.4 ml.  No focal lesion.  Right Kidney:  Measures 11.3 cm and appears normal.  Left Kidney:  Measures 12.3 cm and appears normal.  Abdominal aorta:  No aneurysm identified.  IMPRESSION:  1.  No acute abnormality. 2.  Hepatic cysts.   Original Report Authenticated By: Holley Dexter, M.D.    Ct Biopsy  03/29/2012  *RADIOLOGY REPORT*  Indication: Pancytopenia of unknown etiology, evaluate for leukemia versus MDS.  CT GUIDED LEFT ILIAC BONE MARROW ASPIRATION AND BONE MARROW CORE BIOPSIES  Intravenous medications: Fentanyl 150 mcg IV; Versed 2 mg IV  Sedation time: 9 minutes  Contrast volume: None  Complications: None immediate  PROCEDURE/FINDINGS:  Informed consent was obtained from the patient following an explanation of the procedure, risks, benefits and alternatives. The patient understands, agrees and consents for the procedure. All questions were addressed.  A time out was performed prior to the initiation of the procedure.  The patient was positioned prone and noncontrast localization CT was performed of the pelvis to demonstrate the iliac marrow spaces.  The operative site was prepped and draped in the usual sterile fashion.  Under sterile conditions and local anesthesia, an 11 gauge coaxial bone biopsy needle was advanced into the left iliac marrow space. Needle position was confirmed with CT imaging.  Initially, bone marrow aspiration was performed. Next, a bone marrow biopsy was obtained with the 11 gauge outer bone marrow device. The 11 gauge coaxial bone biopsy needle was readvanced into a slightly different location within the  left iliac marrow space, positioning was confirmed and an additional bone marrow aspiration and biopsy was obtained.  Samples were prepared with the cytotechnologist and deemed adequate.  The needle was removed intact.  Hemostasis was obtained with compression and a dressing was placed. The patient tolerated the procedure well without immediate post procedural complication.  IMPRESSION:  Successful CT guided left iliac bone marrow aspiration and core biopsies.   Original Report Authenticated By: Tacey Ruiz, MD    Dg Hand Complete Right  03/25/2012  *RADIOLOGY REPORT*  Clinical Data: Recurrent skin infections  RIGHT HAND - COMPLETE 3+ VIEW  Comparison: None.  Findings: Negative for fracture.  Negative for arthropathy.  No foreign body or soft tissue swelling.  IMPRESSION: Negative   Original Report Authenticated By: Janeece Riggers, M.D.          Subjective: Patient denies fevers, chills, chest pain, shortness breath, nausea, vomiting, diarrhea, abdominal pain, dizziness, rashes.  Objective: Filed Vitals:   04/01/12 0622 04/01/12 1000 04/01/12 1106 04/01/12 1343  BP: 143/80 91/51 109/57 125/70  Pulse: 63 67 60 57  Temp: 98 F (36.7 C)   98.1 F (36.7 C)  TempSrc: Oral   Oral  Resp: 18   16  Height:      Weight:      SpO2: 97%   98%    Intake/Output Summary (Last 24 hours) at 04/01/12 1348 Last data filed at 04/01/12 1346  Gross per 24 hour  Intake   1980 ml  Output      0 ml  Net   1980 ml   Weight change:  Exam:   General:  Pt is alert, follows commands appropriately, not in acute distress  HEENT: No icterus, No thrush,[place]/AT  Cardiovascular: RRR, S1/S2, no rubs, no gallops  Respiratory: CTA bilaterally, no wheezing, no crackles, no rhonchi  Abdomen: Soft/+BS, non tender, non distended, no guarding  Extremities: Right first metacarpal/metacarpal phalangeal joint area without any effusion, necrosis, crepitance. Minimal erythema. No epitrochlear lymph nodes  Data  Reviewed: Basic Metabolic Panel:  Recent Labs Lab 03/25/12 1750 03/26/12 0415 03/30/12 0423 04/01/12 0544  NA 131* 135 134* 138  K 4.1 3.7 3.7 4.3  CL 97 103 101 106  CO2 22 23 23 24   GLUCOSE 126* 141* 110* 115*  BUN 17 14 13 11   CREATININE 1.08 1.00 1.12 1.18  CALCIUM 8.8 8.6 8.5 9.0   Liver Function Tests:  Recent Labs Lab 03/26/12 0415  AST 11  ALT 12  ALKPHOS 53  BILITOT 0.6  PROT 6.1  ALBUMIN 3.0*   No results found for this basename: LIPASE, AMYLASE,  in the last 168 hours No results found for this basename: AMMONIA,  in the last 168 hours CBC:  Recent Labs Lab 03/28/12 0410 03/29/12 0411 03/30/12 0423 03/31/12 0530 04/01/12 0544  WBC 1.0* 1.1* 1.1* 1.1* 1.4*  NEUTROABS 0.3* 0.3* 0.2* 0.3* 0.5*  HGB 9.4* 10.1* 9.3* 9.4* 9.8*  HCT 26.9* 29.5* 27.1* 27.0* 28.2*  MCV 96.8 97.7 97.5 97.5 97.9  PLT 51* 56* 48* 42* 42*   Cardiac Enzymes: No results found for this basename: CKTOTAL, CKMB, CKMBINDEX, TROPONINI,  in the last 168 hours BNP: No components found with this basename: POCBNP,  CBG: No results found for this basename:  GLUCAP,  in the last 168 hours  Recent Results (from the past 240 hour(s))  CULTURE, BLOOD (ROUTINE X 2)     Status: None   Collection Time    03/25/12  5:50 PM      Result Value Range Status   Specimen Description BLOOD LEFT ARM   Final   Special Requests BOTTLES DRAWN AEROBIC AND ANAEROBIC   Final   Culture  Setup Time 03/25/2012 22:44   Final   Culture NO GROWTH 5 DAYS   Final   Report Status 03/31/2012 FINAL   Final  CULTURE, BLOOD (ROUTINE X 2)     Status: None   Collection Time    03/25/12  5:55 PM      Result Value Range Status   Specimen Description BLOOD LEFT ARM   Final   Special Requests BOTTLES DRAWN AEROBIC AND ANAEROBIC   Final   Culture  Setup Time 03/25/2012 22:44   Final   Culture NO GROWTH 5 DAYS   Final   Report Status 03/31/2012 FINAL   Final  URINE CULTURE     Status: None   Collection Time     03/28/12  4:10 AM      Result Value Range Status   Specimen Description URINE, RANDOM   Final   Culture DUPLICATE REQUEST SEE [id number]   Final   Report Status 03/30/2012 FINAL   Final  URINE CULTURE     Status: None   Collection Time    03/28/12  5:00 PM      Result Value Range Status   Specimen Description URINE, CLEAN CATCH   Final   Special Requests vancomycin, zosyn Immunocompromised   Final   Culture  Setup Time 03/28/2012 21:18   Final   Colony Count NO GROWTH   Final   Culture NO GROWTH   Final   Report Status 03/30/2012 FINAL   Final     Scheduled Meds: . acyclovir  200 mg Oral TID  . folic acid  1 mg Oral Daily  . isosorbide mononitrate  60 mg Oral Daily  . latanoprost  1 drop Both Eyes QHS  . metoprolol succinate  50 mg Oral QPM  . multivitamin with minerals  1 tablet Oral Daily  . Ocuvite PreserVision  1 tablet Oral BID  . pantoprazole  40 mg Oral Daily  . piperacillin-tazobactam (ZOSYN)  IV  3.375 g Intravenous Q8H  . simvastatin  20 mg Oral q1800  . thiamine  100 mg Oral Daily   Or  . thiamine  100 mg Intravenous Daily   Continuous Infusions:    Armonii Sieh, DO  [facility] Pager [phone]  If 7PM-7AM, please contact night-coverage [email or web address] Password [email or web address] 04/01/2012, 1:48 PM   LOS: 7 days

## 2012-04-01 NOTE — Progress Notes (Signed)
04/01/2012, 4:17 PM  Hospital day: 8 Antibiotics: zosyn Chemotherapy: none to date  Prior to seeing patient today, I was able to speak with hematology physicians at [facility], [facility] and [facility]. There are no studies for hairy cell leukemia at any of these institutions. While the tertiary care institutions would be glad to have him transferred to their facilities, Mr Mcnamee tells me now that he does not want treatment at a teaching hospital, tho he would consider outpatient consultation and would prefer [facility] for this if so. The attending physicians with whom I spoke were also in full agreement with completing antibiotics for the cellulitis as long as he is stable otherwise over next couple of days. Those physicians were comfortable with consideration of weekly x6 cladribine or with continuous infusion x 7 days.  Subjective: Overall continues to feel better, with further improvement in hand today. He has walked in hall without difficulty, no fever, anxious to be out of hospital, tells me that he could not tolerate another week in hospital.  Objective: Vital signs in last 24 hours: Blood pressure 125/70, pulse 57, temperature 98.1 F (36.7 C), temperature source Oral, resp. rate 16, height 5\' 11"  (1.803 m), weight 205 lb (92.987 kg), SpO2 98.00%. Alert, appropriate, appears comfortable in recliner with IV infusing LUE and right hand dressed. Respirations not labored RA. No LE swelling, No problems at IV site. No bleeding.  Intake/Output from previous day: 03/19 0701 - 03/20 0700 In: 2020 [P.O.:1680; I.V.:240; IV Piggyback:100] Out: -  Intake/Output this shift: Total I/O In: 760 [P.O.:600; I.V.:160] Out: 0     Lab Results:  Recent Labs  03/31/12 0530 04/01/12 0544  WBC 1.1* 1.4*  HGB 9.4* 9.8*  HCT 27.0* 28.2*  PLT 42* 42*  ANC 0.5 BMET  Recent Labs  03/30/12 0423 04/01/12 0544  NA 134* 138  K 3.7 4.3  CL 101 106  CO2 23 24  GLUCOSE 110* 115*  BUN 13 11  CREATININE 1.12  1.18  CALCIUM 8.5 9.0    Studies/Results: No results found.  Following my visit with patient above, [facility] pharmacist has let me know that cladribine is available for patient to begin treatment. We have been able to schedule him for first treatment on 04-05-12 at 0900; financial staff will look into insurance coverage and new patient scheduler is involved. Hospital oncology liason RN will do chemotherapy teaching for the cladribine, and will also give more instruction re neutropenic and bleeding precautions. I will try to get him set up for consultation at [facility], but we will not delay start of therapy for that visit. If he is stable, he can be DC home tomorrow on oral augmentin thru weekend.  Assessment/Plan: 1.Hairy cell leukemia: pancytopenic but not presently febrile and not bleeding, not symptomatic from the anemia with nonstrenuous activity. Will plan to begin cladribine weekly x6 outpatient beginning 04-05-12. Patient is aware that counts may drop further with treatment before they improve, and that he is at high risk for additional problems related to low counts. 2.cellulitis right hand and forearm related to cat bite: has improved progressively with treatment in hospital,including drainage x2 and IV antibiotics 3.recent dental abscess, resolved 4.CAD post CABG, HTN, followed by Dr [place]. Hold ASA with thrombocytopenia 5.prostate cancer post radical prostatectomy 2008: records requested and received from Dr Isabel Caprice, no known active disease as of fall 2013. 6.post cholecystectomy 7.hx nephrolithiasis 8.past tobacco. Daily ETOH PTA, no problems related to this during admission. Has been on thiamine and folate in hospital.  Time spent including coordination of care 2 hours. Henri Baumler P  [phone]

## 2012-04-01 NOTE — Care Management Note (Signed)
    Page 1 of 1   04/01/2012     12:45:23 PM   CARE MANAGEMENT NOTE 04/01/2012  Patient:  [name], [name]   Account Number:  [id number]  Date Initiated:  03/30/2012  Documentation initiated by:  Lorenda Ishihara  Subjective/Objective Assessment:   78 yo male admitted with cellulitis from cat bite, found to be neutropenic. PTA lived at home with spouse.     Action/Plan:   Home when stable   Anticipated DC Date:  04/02/2012   Anticipated DC Plan:  HOME W HOME HEALTH SERVICES      DC Planning Services  CM consult      Choice offered to / List presented to:             Status of service:  In process, will continue to follow Medicare Important Message given?   (If response is "NO", the following Medicare IM given date fields will be blank) Date Medicare IM given:   Date Additional Medicare IM given:    Discharge Disposition:    Per UR Regulation:  Reviewed for med. necessity/level of care/duration of stay  If discussed at Long Length of Stay Meetings, dates discussed:   04/01/2012    Comments:  04/01/12 [name] RN,BSN NCM [phone] R HAND CAT BITE-IV ABX.WBC-1.1.NEW MW:UXLKG CELL LEUKEMIA.ONCOLOGY FOLLOWING.D/C PLAN HOME.

## 2012-04-01 NOTE — Progress Notes (Signed)
Spoke with patient about treatment with Cladribine.  Reviewed pertinent side effects and symptom management.  Gave him copies of "Chemotherapy and You" and "About Your Cancer Care."  Reviewed printed education materials on Cladribine.

## 2012-04-01 NOTE — Telephone Encounter (Signed)
S/W PT IN RE TO APPT ON 03/24 FOR TREATMENT.

## 2012-04-02 ENCOUNTER — Encounter: Payer: Self-pay | Admitting: *Deleted

## 2012-04-02 ENCOUNTER — Other Ambulatory Visit: Payer: Self-pay | Admitting: Oncology

## 2012-04-02 DIAGNOSIS — L03113 Cellulitis of right upper limb: Secondary | ICD-10-CM

## 2012-04-02 DIAGNOSIS — IMO0002 Reserved for concepts with insufficient information to code with codable children: Secondary | ICD-10-CM

## 2012-04-02 DIAGNOSIS — C9140 Hairy cell leukemia not having achieved remission: Secondary | ICD-10-CM

## 2012-04-02 HISTORY — DX: Cellulitis of right upper limb: L03.113

## 2012-04-02 LAB — CBC WITH DIFFERENTIAL/PLATELET
Eosinophils Absolute: 0 10*3/uL (ref 0.0–0.7)
Eosinophils Relative: 1 % (ref 0–5)
Lymphocytes Relative: 67 % — ABNORMAL HIGH (ref 12–46)
MCH: 33.8 pg (ref 26.0–34.0)
MCHC: 34.5 g/dL (ref 30.0–36.0)
Monocytes Absolute: 0 10*3/uL — ABNORMAL LOW (ref 0.1–1.0)
Neutrophils Relative %: 31 % — ABNORMAL LOW (ref 43–77)
Platelets: 46 10*3/uL — ABNORMAL LOW (ref 150–400)
RBC: 2.9 MIL/uL — ABNORMAL LOW (ref 4.22–5.81)

## 2012-04-02 MED ORDER — AMOXICILLIN-POT CLAVULANATE 875-125 MG PO TABS: 1.0000 | ORAL_TABLET | Freq: Two times a day (BID) | ORAL | Status: DC

## 2012-04-02 MED ORDER — PROCHLORPERAZINE MALEATE 10 MG PO TABS
10.0000 mg | ORAL_TABLET | Freq: Three times a day (TID) | ORAL | Status: DC | PRN
  Filled 2012-04-02: qty 1

## 2012-04-02 MED ORDER — AMOXICILLIN-POT CLAVULANATE 875-125 MG PO TABS
1.0000 | ORAL_TABLET | Freq: Two times a day (BID) | ORAL | Status: DC
  Administered 2012-04-02: 1 via ORAL
  Filled 2012-04-02 (×2): qty 1

## 2012-04-02 MED ORDER — ACYCLOVIR 200 MG PO CAPS: 200.0000 mg | ORAL_CAPSULE | Freq: Three times a day (TID) | ORAL | Status: DC

## 2012-04-02 MED ORDER — ONDANSETRON HCL 4 MG PO TABS
8.0000 mg | ORAL_TABLET | Freq: Three times a day (TID) | ORAL | Status: DC | PRN
Start: 2012-04-02 — End: 2012-04-02

## 2012-04-02 MED ORDER — FILGRASTIM 300 MCG/ML IJ SOLN
300.0000 ug | Freq: Once | INTRAMUSCULAR | Status: AC
  Administered 2012-04-02: 300 ug via SUBCUTANEOUS
  Filled 2012-04-02 (×2): qty 1

## 2012-04-02 MED ORDER — FOLIC ACID 1 MG PO TABS: 1.0000 mg | ORAL_TABLET | Freq: Every day | ORAL | Status: DC

## 2012-04-02 MED ORDER — TRAMADOL HCL 50 MG PO TABS: 50.0000 mg | ORAL_TABLET | Freq: Four times a day (QID) | ORAL | Status: DC | PRN

## 2012-04-02 NOTE — Discharge Summary (Signed)
Physician Discharge Summary  [name] ZOX:[id number] DOB: 1934/12/30 DOA: 03/25/2012  PCP: [name], MD  Admit date: 03/25/2012 Discharge date: 04/02/2012  Recommendations for Outpatient Follow-up:  1. Pt will need to follow up with PCP in 2 weeks post discharge 2. Please obtain BMP to evaluate electrolytes and kidney function 3. Please also check CBC to evaluate Hg and Hct levels 4. Follow up with Dr. Gerhard Perches in one week 5. Follow up with Dr. Ila Mcgill on 04/05/12   Discharge Diagnoses:  Principal Problem:   Infection of hand due to bite Active Problems:   HYPERLIPIDEMIA   Hypertension   Pancytopenia   CAD (coronary artery disease)   Cat bite of hand   Cellulitis and abscess of upper arm and forearm   Infected cat bite of hand Cellulitis of the right hand/Cat bite  -Secondary to cat bite.  -Continue Zosyn day #9 while in patient  -Approximately 75% Cat bites due to Pasteurella multocida  -can switch to po augmentin when ready to d/c  -hand continues to improve  -Patient given a dose of Unasyn in the ED but changed to IV Zosyn given pancytopenia. Added vancomycin on admission. Dr. Izora Ribas (hand surgeon) was consulted who evaluated the patient and irrigated some wound out (3/14 and 3/18) at bedside . Recommended hand elevation and minimal use of the right hand. Blood cultures negative  -Remains afebrile and cellulitis improving.  -Discontinued vancomycin (03/31/12)  -The patient was switched to Augmentin on the day of discharge for 6 additional days of therapy which would make 15 days total for therapy -This was discussed with Dr. Darrold Span whom agreed -Patient received tetanus vaccination at his PCPs office prior to admission  Pancytopenia  -Patient was noted to be pancytopenic on the day of admission with WBC 0.9, hemoglobin 10.0, platelets 52,000 -The patient's platelet count remained stable throughout the hospitalization as well as his hemoglobin -Hematology was  consulted and Dr. Darrold Span saw pt--recommended bone marrow biopsy done 03/29/12 - ANC 403 on day of discharge - Appreciate hematology followup. Bone marrow biopsy (3/17) and flow cytometric done.  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 There was no splenomegaly -The patient was given doses of Neupogen on March 18 and March 21 with some improvement of his WBC. His WBC on the day of discharge was 1300. -has history of prostate cancer and has been stable for past 6 months. Follows with Dr. Isabel Caprice .  -iron and B12 wnl.  Hairy cell leukemia  -Management per Dr. Darrold Span  -After consulting with tertiary care centers and speaking with the patient, it was ultimately decided that the patient did not need to be transferred at this time -The patient was stable for discharge and outpatient chemotherapy was arranged by Dr. Darrold Span to start on 04/05/12 -The patient was amenable to future followup at [facility] whom Dr. Darrold Span will arrange CAD  -Dr. Darrold Span recommended to continue to hold ASA due  to the patients pancytopenia and his impending chemotherapy -Instructed the patient to followup with her regarding when to restart aspirin Status post CABG. Holding aspirin given thrombocytopenia  Hypertension  Continue metoprolol succinate, Imdur -d/c losartan due to soft BP  -Patient was instructed to followup with his primary care provider regarding future need for losartan Hyperlipidemia  Stable. Continue home medications  Family Communication: Pt at beside  Disposition Plan: Home  Procedures:  Bone marrow biopsy--03/29/2012 Antibiotics:  Vancomycin 03/25/2012>>> 03/31/2012  Zosyn 03/25/2012>>>04/02/12   Discharge Condition: stable  Disposition:  Follow-up Information   Follow up with Molinda Bailiff, MD In 1 week.   Contact information:   [address] [place]  [address] [phone]       Call Reece Packer, MD.   Contact information:   [address] [place] [address] [phone]       Diet:heart healthy Wt Readings from Last 3 Encounters:  03/25/12 92.987 kg (205 lb)  03/25/12 92.534 kg (204 lb)  02/17/12 91.627 kg (202 lb)    History of present illness:  78 y.o. male with history of CAD status post CABG, hypertension, hyperlipidemia was referred to the ER after patient had gone to his PCPs office after having a cat bite night before admission on his hand. Patient noticed increased swelling and pain of the right hand where the cat bit and patient also had subjective feeling of fever and chills at the PCPs office. He was given tetanus toxoid and referred to the ER. In the ER x-rays of the hand reveal no foreign body or any acute features. Patient in addition has been found to have pancytopenia. Patient has been admitted for further management. Patient states that he has had 3 weeks ago root canal treatment for which he had taken amoxicillin for a few days. 2 weeks ago he had cataract surgery and had been on eyedrops. Other than these medications he has not been on any other new medications. Denies any recent travel or insect bites. In the system patient's last complete blood count was in 2011 which was showing normal counts and mild thrombocytopenia.    Consultants: MedOnc--Dr. Darrold Span Plastic/Hand surgery--Dr. Knute Neu  Discharge Exam: Filed Vitals:   04/02/12 0624  BP: 146/82  Pulse: 57  Temp: 98 F (36.7 C)  Resp: 16   Filed Vitals:   04/01/12 1343 04/01/12 1700 04/01/12 2142 04/02/12 0624  BP: 125/70 137/64 135/72 146/82  Pulse: 57 70 69 57  Temp: 98.1 F (36.7 C)  97.9 F (36.6 C) 98 F (36.7 C)  TempSrc: Oral  Oral Oral  Resp: 16  16 16   Height:      Weight:      SpO2: 98%  98% 97%   General: A&O x 3, NAD, pleasant, cooperative Cardiovascular: RRR, no rub, no gallop, no S3 Respiratory: CTAB, no  wheeze, no rhonchi Abdomen:soft, nontender, nondistended, positive bowel sounds Extremities: Right hand-- No edema, No lymphangitis, no petechiae; no necrosis, no epitrochlear adenopathy  Discharge Instructions      Discharge Orders   Future Appointments Provider Department Dept Phone   04/05/2012 8:00 AM [facility] [phone]   04/05/2012 8:15 AM Krista Blue [facility] [phone]   04/05/2012 9:00 AM [facility] [phone]   Future Orders Complete By Expires     Diet - low sodium heart healthy  As directed     Discharge instructions  As directed     Comments:      Stop taking losartan (Cozaar) until you follow up with your primary care doctor Stop taking aspirin until Dr. Darrold Span says it is okay to restart  Stop advil  Augmentin twice a day x 6 days    Increase activity slowly  As directed         Medication List    STOP taking these medications  aspirin 81 MG tablet     ibuprofen 200 MG tablet  Commonly known as:  ADVIL,MOTRIN     losartan 50 MG tablet  Commonly known as:  COZAAR      TAKE these medications       acyclovir 200 MG capsule  Commonly known as:  ZOVIRAX  Take 1 capsule (200 mg total) by mouth 3 (three) times daily.     amoxicillin-clavulanate 875-125 MG per tablet  Commonly known as:  AUGMENTIN  Take 1 tablet by mouth every 12 (twelve) hours.     brimonidine 0.2 % ophthalmic solution  Commonly known as:  ALPHAGAN  Place 1 drop into both eyes 2 (two) times daily.     folic acid 1 MG tablet  Commonly known as:  FOLVITE  Take 1 tablet (1 mg total) by mouth daily.     isosorbide mononitrate 60 MG 24 hr tablet  Commonly known as:  IMDUR  Take 1 tablet (60 mg total) by mouth daily.     latanoprost 0.005 % ophthalmic solution  Commonly known as:  XALATAN  Place 1 drop into both eyes at bedtime.      metoprolol succinate 50 MG 24 hr tablet  Commonly known as:  TOPROL-XL  Take 1 tablet (50 mg total) by mouth every evening.     multivitamin tablet  Take 1 tablet by mouth daily.     nitroGLYCERIN 0.4 MG SL tablet  Commonly known as:  NITROSTAT  Place 1 tablet (0.4 mg total) under the tongue every 5 (five) minutes as needed for chest pain.     omeprazole 20 MG capsule  Commonly known as:  PRILOSEC  Take 20 mg by mouth every morning.     PRESERVISION AREDS PO  Take 1 tablet by mouth 2 (two) times daily.     simvastatin 40 MG tablet  Commonly known as:  ZOCOR  Take 0.5 tablets (20 mg total) by mouth daily.     traMADol 50 MG tablet  Commonly known as:  ULTRAM  Take 1 tablet (50 mg total) by mouth every 6 (six) hours as needed for pain.         The results of significant diagnostics from this hospitalization (including imaging, microbiology, ancillary and laboratory) are listed below for reference.    Significant Diagnostic Studies: US Abdomen Complete  03/29/2012  *RADIOLOGY REPORT*  Clinical Data:  Pancytopenia.  COMPLETE ABDOMINAL ULTRASOUND  Comparison:  None.  Findings:  Gallbladder:  Removed.  Common bile duct:  Measures 0.4 cm.  Liver:  The liver demonstrates increased echogenicity.  Scattered cysts are identified.  Most of these are subcentimeter in size of but a lesion in the right hepatic lobe measures 7.1 cm in diameter.  IVC:  Appears normal.  Pancreas:  No focal abnormality seen.  Spleen:  Measures 10.3 cm with a volume of 288.4 ml.  No focal lesion.  Right Kidney:  Measures 11.3 cm and appears normal.  Left Kidney:  Measures 12.3 cm and appears normal.  Abdominal aorta:  No aneurysm identified.  IMPRESSION:  1.  No acute abnormality. 2.  Hepatic cysts.   Original Report Authenticated By: Holley Dexter, M.D.    Ct Biopsy  03/29/2012  *RADIOLOGY REPORT*  Indication: Pancytopenia of unknown etiology, evaluate for leukemia versus MDS.  CT GUIDED LEFT ILIAC BONE  MARROW ASPIRATION AND BONE MARROW CORE BIOPSIES  Intravenous medications: Fentanyl 150 mcg IV; Versed 2 mg IV  Sedation time: 9 minutes  Contrast volume: None  Complications: None immediate  PROCEDURE/FINDINGS:  Informed consent was obtained from the patient following an explanation of the procedure, risks, benefits and alternatives. The patient understands, agrees and consents for the procedure. All questions were addressed.  A time out was performed prior to the initiation of the procedure.  The patient was positioned prone and noncontrast localization CT was performed of the pelvis to demonstrate the iliac marrow spaces.  The operative site was prepped and draped in the usual sterile fashion.  Under sterile conditions and local anesthesia, an 11 gauge coaxial bone biopsy needle was advanced into the left iliac marrow space. Needle position was confirmed with CT imaging.  Initially, bone marrow aspiration was performed. Next, a bone marrow biopsy was obtained with the 11 gauge outer bone marrow device. The 11 gauge coaxial bone biopsy needle was readvanced into a slightly different location within the left iliac marrow space, positioning was confirmed and an additional bone marrow aspiration and biopsy was obtained.  Samples were prepared with the cytotechnologist and deemed adequate.  The needle was removed intact.  Hemostasis was obtained with compression and a dressing was placed. The patient tolerated the procedure well without immediate post procedural complication.  IMPRESSION:  Successful CT guided left iliac bone marrow aspiration and core biopsies.   Original Report Authenticated By: Tacey Ruiz, MD    Dg Hand Complete Right  03/25/2012  *RADIOLOGY REPORT*  Clinical Data: Recurrent skin infections  RIGHT HAND - COMPLETE 3+ VIEW  Comparison: None.  Findings: Negative for fracture.  Negative for arthropathy.  No foreign body or soft tissue swelling.  IMPRESSION: Negative   Original Report Authenticated  By: Janeece Riggers, M.D.      Microbiology: Recent Results (from the past 240 hour(s))  CULTURE, BLOOD (ROUTINE X 2)     Status: None   Collection Time    03/25/12  5:50 PM      Result Value Range Status   Specimen Description BLOOD LEFT ARM   Final   Special Requests BOTTLES DRAWN AEROBIC AND ANAEROBIC   Final   Culture  Setup Time 03/25/2012 22:44   Final   Culture NO GROWTH 5 DAYS   Final   Report Status 03/31/2012 FINAL   Final  CULTURE, BLOOD (ROUTINE X 2)     Status: None   Collection Time    03/25/12  5:55 PM      Result Value Range Status   Specimen Description BLOOD LEFT ARM   Final   Special Requests BOTTLES DRAWN AEROBIC AND ANAEROBIC   Final   Culture  Setup Time 03/25/2012 22:44   Final   Culture NO GROWTH 5 DAYS   Final   Report Status 03/31/2012 FINAL   Final  URINE CULTURE     Status: None   Collection Time    03/28/12  4:10 AM      Result Value Range Status   Specimen Description URINE, RANDOM   Final   Culture DUPLICATE REQUEST SEE [id number]   Final   Report Status 03/30/2012 FINAL   Final  URINE CULTURE     Status: None   Collection Time    03/28/12  5:00 PM      Result Value Range Status   Specimen Description URINE, CLEAN CATCH   Final   Special Requests vancomycin, zosyn Immunocompromised   Final   Culture  Setup Time 03/28/2012 21:18   Final   Colony Count NO GROWTH   Final   Culture NO  GROWTH   Final   Report Status 03/30/2012 FINAL   Final     Labs: Basic Metabolic Panel:  Recent Labs Lab 03/30/12 0423 04/01/12 0544  NA 134* 138  K 3.7 4.3  CL 101 106  CO2 23 24  GLUCOSE 110* 115*  BUN 13 11  CREATININE 1.12 1.18  CALCIUM 8.5 9.0   Liver Function Tests: No results found for this basename: AST, ALT, ALKPHOS, BILITOT, PROT, ALBUMIN,  in the last 168 hours No results found for this basename: LIPASE, AMYLASE,  in the last 168 hours No results found for this basename: AMMONIA,  in the last 168 hours CBC:  Recent Labs Lab  03/29/12 0411 03/30/12 0423 03/31/12 0530 04/01/12 0544 04/02/12 0425  WBC 1.1* 1.1* 1.1* 1.4* 1.3*  NEUTROABS 0.3* 0.2* 0.3* 0.5* 0.4*  HGB 10.1* 9.3* 9.4* 9.8* 9.8*  HCT 29.5* 27.1* 27.0* 28.2* 28.4*  MCV 97.7 97.5 97.5 97.9 97.9  PLT 56* 48* 42* 42* 46*   Cardiac Enzymes: No results found for this basename: CKTOTAL, CKMB, CKMBINDEX, TROPONINI,  in the last 168 hours BNP: No components found with this basename: POCBNP,  CBG: No results found for this basename: GLUCAP,  in the last 168 hours  Time coordinating discharge:  Greater than 30 minutes  Signed:  Pam Vanalstine, DO [facility] Pager: [phone] 04/02/2012, 10:34 AM

## 2012-04-02 NOTE — Progress Notes (Signed)
Patient to have Chemo Education before discharge today by Elvis Coil, RN for Cladribine. Pt will have private session upon arrival to [facility] treatment area for further questions, clarification and information related to treatment given as outpatient at [facility].

## 2012-04-02 NOTE — Progress Notes (Signed)
  04/02/2012, 9:09 AM  Hospital day: 9 Antibiotics: day 9 zosyn Chemotherapy: Cladribine to be given weekly x6 at [facility] beginning 04-05-12    Subjective: No problems overnight other than IV beeping. No bleeding. Only minimal discomfort at puncture wound from cat bite now. Walking in hall with mask this am, tolerated fine, does notice slight SOB climbing stairs. Appetite good, a little constipated and may need stool softener. No chills, no symptoms of infection.  Chemotherapy teaching for 2CDA done yesterday by oncology liason RN. He has reviewed all of this material. We have chemotherapy scheduled to begin 04-05-12 AM, lab prior.  I have discussed antiemetics (zofran, compazine), the oral augmentin, acyclovir prophylaxis, no ASA or NSAID. He understands that he needs to call if any fever, symptoms of infection, bleeding. He uses [facility].  We have discussed neutropenic and bleeding precautions, and symptoms of anemia. He lives 5 minutes from [facility]/ [facility].  Objective: Vital signs in last 24 hours: Blood pressure 146/82, pulse 57, temperature 98 F (36.7 C), temperature source Oral, resp. rate 16, height 5\' 11"  (1.803 m), weight 205 lb (92.987 kg), SpO2 97.00%.   Intake/Output from previous day: 03/20 0701 - 03/21 0700 In: 1020.7 [P.O.:600; I.V.:320.7; IV Piggyback:100] Out: 0  Intake/Output this shift: Total I/O In: 240 [P.O.:240] Out: -   Physical exam: Alert, appears comfortable ambulating, IV left forearm site ok. Not icteric. Oral mucosa moist and clear. Lungs clear. Only minimal erythema across knuckles right hand and between first finger and thumb. No swelling at puncture site and erythema there dull. Vaseline gauze to the puncture wound. No swelling fingers or arm, no streaking up arm. Lungs clear. Heart RRR. Abdomen soft, nontender. LE no edema, cords, tenderness.  Lab Results:  Recent Labs  04/01/12 0544 04/02/12 0425  WBC 1.4* 1.3*  HGB 9.8* 9.8*  HCT 28.2*  28.4*  PLT 42* 46*  ANC 0.4 BMET  Recent Labs  04/01/12 0544  NA 138  K 4.3  CL 106  CO2 24  GLUCOSE 115*  BUN 11  CREATININE 1.18  CALCIUM 9.0    Studies/Results: No results found.   Assessment/Plan: 1.Hairy cell leukemia: Pancytopenic tho no splenomegaly by [place] this admission. Recent dental abscess and present cellulitis from cat bite worse due to neutropenia, but fortunately dental abscess resolved and the cellulitis is nearly resolved despite neutropenia.  Will continue antibiotic coverage with oral augmentin, suggest full 14 days. Patient prefers treatment of the hairy cell leukemia in [place], and we will start with weekly 2CDA on 04-05-12. If fever >=100.5 or other symptoms of infection he will need to be readmitted due to ongoing neutropenia. Neupogen to be given again today (#2 ) as WBC was a little better after injection on 03-30-12. 2. Hx MI, CABG x4, HTN, elevated lipids  3.hx prostate cancer followed by Dr Isabel Caprice.Recent PSAs undetectable.  4.post unilateral cataract extraction: will need to wait until hairy cell leukemia treatment is complete for other eye surgery  5.glaucoma  6.hx nephrolitiasis  Discussed with RN at bedside and with Dr Tat on unit.  LIVESAY,LENNIS P

## 2012-04-05 ENCOUNTER — Ambulatory Visit: Payer: Medicare Other

## 2012-04-05 ENCOUNTER — Ambulatory Visit (HOSPITAL_BASED_OUTPATIENT_CLINIC_OR_DEPARTMENT_OTHER): Payer: Medicare Other

## 2012-04-05 ENCOUNTER — Other Ambulatory Visit (HOSPITAL_BASED_OUTPATIENT_CLINIC_OR_DEPARTMENT_OTHER): Payer: Medicare Other | Admitting: Lab

## 2012-04-05 ENCOUNTER — Ambulatory Visit (HOSPITAL_BASED_OUTPATIENT_CLINIC_OR_DEPARTMENT_OTHER): Payer: Medicare Other | Admitting: Oncology

## 2012-04-05 ENCOUNTER — Telehealth: Payer: Self-pay | Admitting: Oncology

## 2012-04-05 ENCOUNTER — Encounter: Payer: Self-pay | Admitting: Oncology

## 2012-04-05 VITALS — BP 109/80 | HR 86 | Temp 97.8°F | Resp 19

## 2012-04-05 VITALS — BP 109/80 | HR 86 | Temp 97.8°F | Resp 19 | Ht 71.0 in

## 2012-04-05 DIAGNOSIS — Z8546 Personal history of malignant neoplasm of prostate: Secondary | ICD-10-CM

## 2012-04-05 DIAGNOSIS — I10 Essential (primary) hypertension: Secondary | ICD-10-CM

## 2012-04-05 DIAGNOSIS — C9140 Hairy cell leukemia not having achieved remission: Secondary | ICD-10-CM

## 2012-04-05 DIAGNOSIS — D61818 Other pancytopenia: Secondary | ICD-10-CM

## 2012-04-05 DIAGNOSIS — Z5111 Encounter for antineoplastic chemotherapy: Secondary | ICD-10-CM

## 2012-04-05 LAB — CBC WITH DIFFERENTIAL/PLATELET
BASO%: 0.6 % (ref 0.0–2.0)
EOS%: 0.6 % (ref 0.0–7.0)
Eosinophils Absolute: 0 10*3/uL (ref 0.0–0.5)
LYMPH%: 65 % — ABNORMAL HIGH (ref 14.0–49.0)
MCH: 32.8 pg (ref 27.2–33.4)
MCHC: 33.6 g/dL (ref 32.0–36.0)
MCV: 97.7 fL (ref 79.3–98.0)
MONO%: 1.8 % (ref 0.0–14.0)
Platelets: 51 10*3/uL — ABNORMAL LOW (ref 140–400)
RBC: 3.44 10*6/uL — ABNORMAL LOW (ref 4.20–5.82)
nRBC: 0 % (ref 0–0)

## 2012-04-05 LAB — COMPREHENSIVE METABOLIC PANEL (CC13)
ALT: 21 U/L (ref 0–55)
Alkaline Phosphatase: 59 U/L (ref 40–150)
CO2: 22 mEq/L (ref 22–29)
Creatinine: 1.1 mg/dL (ref 0.7–1.3)
Total Bilirubin: 0.37 mg/dL (ref 0.20–1.20)

## 2012-04-05 LAB — URIC ACID (CC13): Uric Acid, Serum: 6.5 mg/dl (ref 2.6–7.4)

## 2012-04-05 LAB — TECHNOLOGIST REVIEW

## 2012-04-05 MED ORDER — SODIUM CHLORIDE 0.9 % IV SOLN
Freq: Once | INTRAVENOUS | Status: AC
  Administered 2012-04-05: 10:00:00 via INTRAVENOUS

## 2012-04-05 MED ORDER — SODIUM CHLORIDE 0.9 % IV SOLN
0.1500 mg/kg | Freq: Once | INTRAVENOUS | Status: AC
  Administered 2012-04-05: 14 mg via INTRAVENOUS
  Filled 2012-04-05: qty 14

## 2012-04-05 MED ORDER — ONDANSETRON 8 MG/50ML IVPB (CHCC)
8.0000 mg | Freq: Once | INTRAVENOUS | Status: AC
  Administered 2012-04-05: 8 mg via INTRAVENOUS

## 2012-04-05 MED ORDER — PROCHLORPERAZINE MALEATE 10 MG PO TABS: 10.0000 mg | ORAL_TABLET | Freq: Four times a day (QID) | ORAL | Status: DC | PRN

## 2012-04-05 MED ORDER — ONDANSETRON HCL 8 MG PO TABS: 8.0000 mg | ORAL_TABLET | Freq: Three times a day (TID) | ORAL | Status: DC | PRN

## 2012-04-05 NOTE — Progress Notes (Signed)
Reviewed discharge summary and information regarding neutropenic precautions with patient.  Also included written information regarding said precautions.

## 2012-04-05 NOTE — Patient Instructions (Addendum)
[facility] Discharge Instructions for Patients Receiving Chemotherapy  Today you received the following chemotherapy agents: Cladribine.  To help prevent nausea and vomiting after your treatment, we encourage you to take your nausea medications: Ondansetron (Zofran), and Prochlorperazine (Compazine).  These prescriptions have been sent to your pharmacy.  Take Ondansetron first.  Take Prochlorperazine if Ondansetron does not relieve your nausea or if your nausea returns and it is not yet time to take the Ondansetron again.  Take your nausea medication at the first indication of nausea.  Do not wait for nausea to increase.  It is easier to prevent nausea and vomiting then it is to stop it once it has started.   If you develop nausea and vomiting that is not controlled by your nausea medication, call the clinic.    BELOW ARE SYMPTOMS THAT SHOULD BE REPORTED IMMEDIATELY:  *FEVER GREATER THAN 100.5 F  *CHILLS WITH OR WITHOUT FEVER  NAUSEA AND VOMITING THAT IS NOT CONTROLLED WITH YOUR NAUSEA MEDICATION  *UNUSUAL SHORTNESS OF BREATH  *UNUSUAL BRUISING OR BLEEDING  TENDERNESS IN MOUTH AND THROAT WITH OR WITHOUT PRESENCE OF ULCERS  *URINARY PROBLEMS  *BOWEL PROBLEMS  UNUSUAL RASH Items with * indicate a potential emergency and should be followed up as soon as possible.  One of the nurses will contact you 24 hours after your treatment. Please let the nurse know about any problems that you may have experienced. Feel free to call the clinic you have any questions or concerns. The clinic phone number is [phone].  Neutropenia Neutropenia is a condition that occurs when the level of a certain type of white blood cell (neutrophil) in your body becomes lower than normal. Neutrophils are made in the bone marrow and fight infections. These cells protect against bacteria and viruses. The fewer neutrophils you have, and the longer your body remains without them, the greater your  risk of getting a severe infection becomes. CAUSES  The cause of neutropenia may be hard to determine. However, it is usually due to 3 main problems:   Decreased production of neutrophils. This may be due to:  Certain medicines such as chemotherapy.  Genetic problems.  Cancer.  Radiation treatments.  Vitamin deficiency.  Some pesticides.  Increased destruction of neutrophils. This may be due to:  Overwhelming infections.  Hemolytic anemia. This is when the body destroys its own blood cells.  Chemotherapy.  Neutrophils moving to areas of the body where they cannot fight infections. This may be due to:  Dialysis procedures.  Conditions where the spleen becomes enlarged. Neutrophils are held in the spleen and are not available to the rest of the body.  Overwhelming infections. The neutrophils are held in the area of the infection and are not available to the rest of the body. SYMPTOMS  There are no specific symptoms of neutropenia. The lack of neutrophils can result in an infection, and an infection can cause various problems. DIAGNOSIS  Diagnosis is made by a blood test. A complete blood count is performed. The normal level of neutrophils in human blood differs with age and race. Infants have lower counts than older children and adults. African Americans have lower counts than Caucasians or Asians. The average adult level is 1500 cells/mm3 of blood. Neutrophil counts are interpreted as follows:  Greater than 1000 cells/mm3 gives normal protection against infection.  500 to 1000 cells/mm3 gives an increased risk for infection.  200 to 500 cells/mm3 is a greater risk for severe infection.  Lower  than 200 cells/mm3 is a marked risk of infection. This may require hospitalization and treatment with antibiotic medicines. TREATMENT  Treatment depends on the underlying cause, severity, and presence of infections or symptoms. It also depends on your health. Your caregiver will  discuss the treatment plan with you. Mild cases are often easily treated and have a good outcome. Preventative measures may also be started to limit your risk of infections. Treatment can include:  Taking antibiotics.  Stopping medicines that are known to cause neutropenia.  Correcting nutritional deficiencies by eating green vegetables to supply folic acid and taking vitamin B supplements.  Stopping exposure to pesticides if your neutropenia is related to pesticide exposure.  Taking a blood growth factor called sargramostim, pegfilgrastim, or filgrastim if you are undergoing chemotherapy for cancer. This stimulates white blood cell production.  Removal of the spleen if you have Felty's syndrome and have repeated infections. HOME CARE INSTRUCTIONS   Follow your caregiver's instructions about when you need to have blood work done.  Wash your hands often. Make sure others who come in contact with you also wash their hands.  Wash raw fruits and vegetables before eating them. They can carry bacteria and fungi.  Avoid people with colds or spreadable (contagious) diseases (chickenpox, herpes zoster, influenza).  Avoid large crowds.  Avoid construction areas. The dust can release fungus into the air.  Be cautious around children in daycare or school environments.  Take care of your respiratory system by coughing and deep breathing.  Bathe daily.  Protect your skin from cuts and burns.  Do not work in the garden or with flowers and plants.  Care for the mouth before and after meals by brushing with a soft toothbrush. If you have mucositis, do not use mouthwash. Mouthwash contains alcohol and can dry out the mouth even more.  Clean the area between the genitals and the anus (perineal area) after urination and bowel movements. Women need to wipe from front to back.  Use a water soluble lubricant during sexual intercourse and practice good hygiene after. Do not have intercourse if you  are severely neutropenic. Check with your caregiver for guidelines.  Exercise daily as tolerated.  Avoid people who were vaccinated with a live vaccine in the past 30 days. You should not receive live vaccines (polio, typhoid).  Do not provide direct care for pets. Avoid animal droppings. Do not clean litter boxes and bird cages.  Do not share food utensils.  Do not use tampons, enemas, or rectal suppositories unless directed by your caregiver.  Use an electric razor to remove hair.  Wash your hands after handling magazines, letters, and newspapers. SEEK IMMEDIATE MEDICAL CARE IF:   You have a fever.  You have chills or start to shake.  You feel nauseous or vomit.  You develop mouth sores.  You develop aches and pains.  You have redness and swelling around open wounds.  Your skin is warm to the touch.  You have pus coming from your wounds.  You develop swollen lymph nodes.  You feel weak or fatigued.  You develop red streaks on the skin. MAKE SURE YOU:  Understand these instructions.  Will watch your condition.  Will get help right away if you are not doing well or get worse. Document Released: 06/21/2001 Document Revised: 03/24/2011 Document Reviewed: 07/19/2010 [facility] Patient Information 2013 [place], [place].

## 2012-04-05 NOTE — Progress Notes (Signed)
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  Patient was hospitalized from 3-13 thru 04-02-12 after presenting to primary physician Dr Alwyn Ren with cellulitis and shaking chills after his cat bit right hand at base of thumb. He was pancytopenic on admission. Bone marrow exam 03-29-2012 showed hypercellular marrow with B cell lymphoproliferative process, with flow cytometry documenting hairy cell leukemia. The RUE cellulitis progressively improved with IV antibiotics (vanc + zosyn initially, then zosyn alone) and he was otherwise stable while in hospital. [facility] and was able to DC home on 04-02-12 on oral augmentin. He agrees to outpatient consultation at [facility], which we are requesting even as we start treatment now.  Patient has had no fever since DC from hospital on 04-02-12. The RUE cellulitis has resolved other than mild erythema at puncture wound, no bleeding or drainage there. He denies oral discomfort now, having had dental abscess shortly prior to the cat bite cellulitis. He is fatigued with exertion such as walking in grocery store on 04-03-12 (tho he had been instructed not to go into stores, crowds etc). He has had some loose stools 2-3x yesterday and this AM; we have discussed taking the augmentin every 12 hours with food. He denies bleeding, cough, nausea. He slept well last pm and appetite is good. He mentions that for several months he has had occasional light headedness when he initially stands, which Dr Alwyn Ren felt was related to low BP; we have discussed staying well hydrated. Remainder of 10 point Review of Systems negative/ unchanged.  Objective:  Vital signs in last 24 hours: Temp 97.8, HR 86  regular, resp 19 and not labored RA, BP 109/80 Alert, appears comfortable at 45 degrees on RA, speech fluent and appropriate, respirations not labored.    HEENT:PERRLA, sclera clear, anicteric and oropharynx clear, no lesions LymphaticsCervical, supraclavicular, and axillary nodes normal. and no cervical or supraclavicular adenopathy Resp: clear to auscultation bilaterally Cardio: regular rate and rhythm GI: soft, non-tender; bowel sounds normal; no masses,  no organomegaly Extremities: No edema, cords or tenderness bilat LE and LUE RUE with no erythema other than mild ~ 2 cm diameter around the puncture wound, no swelling now. Almost no erythema across distal hand now, no swelling. Neuro nonfocal Skin otherwise without petechiae. Resolving ecchymosis at blood draw sites antecubital. Peripheral IV left forearm infusing without difficulty.  Lab Results:  Results for orders placed in visit on 04/05/12  TECHNOLOGIST REVIEW      Result Value Range   Technologist Review variant lymphs, Oc helmets,few teardrops    CBC WITH DIFFERENTIAL      Result Value Range   WBC 1.6 (*) 4.0 - 10.3 10e3/uL   NEUT# 0.5 (*) 1.5 - 6.5 10e3/uL   HGB 11.3 (*) 13.0 - 17.1 g/dL   HCT 62.9 (*) 52.8 - 41.3 %   Platelets 51 (*) 140 - 400 10e3/uL   MCV 97.7  79.3 - 98.0 fL   MCH 32.8  27.2 - 33.4 pg   MCHC 33.6  32.0 - 36.0 g/dL   RBC 2.44 (*) 0.10 - 2.72 10e6/uL   RDW 15.6 (*) 11.0 - 14.6 %   lymph# 1.1  0.9 - 3.3 10e3/uL   MONO# 0.0 (*) 0.1 - 0.9 10e3/uL  Eosinophils Absolute 0.0  0.0 - 0.5 10e3/uL   Basophils Absolute 0.0  0.0 - 0.1 10e3/uL   NEUT% 32.0 (*) 39.0 - 75.0 %   LYMPH% 65.0 (*) 14.0 - 49.0 %   MONO% 1.8  0.0 - 14.0 %   EOS% 0.6  0.0 - 7.0 %   BASO% 0.6  0.0 - 2.0 %   nRBC 0  0 - 0 %    CBC above was by fingerstick, note Hgb in hospital 9.3 - 9.8 and I am not sure present is correct.   CMET available now: Na 136, K 4.4, Cl 105, glu 139, BUN 15.9, creat 1.1, Tbili 0.37, AP 59, AST 18, ALT  21, Tprot 6.9, alb 3.4, Ca 9.5 Uric acid 6.5   Studies/Results:  Pathology  Accession #: [id number] Diagnosis Bone Marrow, Aspirate,Biopsy, and Clot, pelvis left iliac - HYPERCELLULAR MARROW WITH B CELL LYMPHOPROLIFERATIVE PROCESS CONSISTENT WITH HAIRY CELL LEUKEMIA. PERIPHERAL BLOOD: - PANCYTOPENIA WITH ATYPICAL MONONUCLEAR CELLS. Diagnosis Note The marrow is hypercellular for age and shows a diffuse, interstitial infiltrate of mononuclear cells with round to oval nuclei and moderate cytoplasm. These cells are positive with B cell markers CD20 and CD79a and show lambda light chain restriction. Flow cytometry ([id number]) shows a lambda restricted monoclonal B cell population comprising 24% of the cells which is positive for CD19, CD20, CD22, CD103, CD25, CD11c and FMC-7. The morphologic and immunophenotypic findings are most consistent with marrow involvement by hairy cell leukemia.      Accession #: [id number] FLOW CYTOMETRY REPORINTERPRETATI Bone Marrow Flow Cytometry FLOW CYTOMETRY OF THIS BONE MARROW SPECIMEN SHOWS A LAMBDA RESTRICTED MONOCLONAL B CELL POPULATION WHICH IS POSITIVE FOR CD19, CD20, CD22, CD11C, CD25, CD103 AND FMC-7. THE MORPHOLOGIC FEATURES SHOW AN ATYPICAL MONONUCLEAR POPULATION AND IN CONJUNCTION WITH THE IMMUNOPHENOTYPE, FINDINGS ARE MOST CONSISTENT WITH MARROW INVOLVEMENT BY HAIRY CELL LEUKEMIA.  Medications: I have reviewed the patient's current medications. He will continue augmentin 875-125 mg q 12 hrs to complete 2 weeks of antibiotics; he is on prophylactic acyclovir 200 mg tid and likely will need PCP prophylaxis. Note he is on metoprolol 50 mg qhs as well as imdur. Zofran and compazine were not given at DC and will be sent to [facility] now  We have reviewed neutropenic precautions. He had additional teaching by chemotherapy education RN today, initial information given at hospital.  Assessment/Plan:  1.Hairy cell leukemia: bone marrow  involvement with pancytopenia on presentation tho no splenomegaly. Patient prefers treatment in [place] if possible and has begun week 1/6 2CDA today, the weekly regimen chosen as it is felt more tolerable for patients with pancytopenia at start of treatment. I will see him with labs on 3-26 and he will see PA on my call day 3-28. Referral for consultation at [facility] in process. He is at high risk for complications related to pancytopenia. 2.cat bite cellulitis: progressively improving, continuing augmentin at least thru 04-08-12. He was seen with drainage of the wound x2 in hospital by Dr Knute Neu, his last note mentioning follow up at office after DC (not yet set up). 3.recent dental abscess resolved 4.CAD, previous CABG x4, hx MI, HTN, elevated lipids 5.hx prostate cancer post radical prostatectomy 2008, not recurrent 6.Raynaud's 7.GERD 8.hx nephrolithiasis  Patient seemed to follow all of conversation well and understands that he should call at any time if fever, bleeding or other problems. He is to avoid crowds, stores, restaurants etc.  Reece Packer, MD   04/05/2012, 10:25 AM

## 2012-04-05 NOTE — Progress Notes (Signed)
Checked in new pt with no financial concerns. °

## 2012-04-06 ENCOUNTER — Telehealth: Payer: Self-pay

## 2012-04-06 NOTE — Telephone Encounter (Signed)
Message copied by Lorine Bears on Tue Apr 06, 2012  3:03 PM ------      Message from: Reece Packer      Created: Tue Apr 06, 2012 12:33 PM       Please try to speak to him by phone today - had first chemo yesterday and is pancytopenic. He is on augmentin for cellulitis.       thanks ------

## 2012-04-06 NOTE — Telephone Encounter (Signed)
Spoke with Mr. Bingley and he is doing well after his first dose of cladribine yesterday.  No n/vor diarrhea. Afebrile.   He has not had to use any nausea medication.  He is experiencing some fatigue.  The cellulitis continues to improve.  He will keep appointment as scheduled tomorrow with Dr. Darrold Span. Zofran prescription needs prior authorization per [facility].  Gave form to [facility] in managed care to process.

## 2012-04-07 ENCOUNTER — Other Ambulatory Visit (HOSPITAL_BASED_OUTPATIENT_CLINIC_OR_DEPARTMENT_OTHER): Payer: Medicare Other | Admitting: Lab

## 2012-04-07 ENCOUNTER — Ambulatory Visit (HOSPITAL_BASED_OUTPATIENT_CLINIC_OR_DEPARTMENT_OTHER): Payer: Medicare Other | Admitting: Oncology

## 2012-04-07 ENCOUNTER — Encounter: Payer: Self-pay | Admitting: Oncology

## 2012-04-07 VITALS — BP 136/74 | HR 63 | Temp 96.9°F | Resp 18 | Ht 71.0 in | Wt 198.4 lb

## 2012-04-07 DIAGNOSIS — C9140 Hairy cell leukemia not having achieved remission: Secondary | ICD-10-CM

## 2012-04-07 DIAGNOSIS — Z8546 Personal history of malignant neoplasm of prostate: Secondary | ICD-10-CM

## 2012-04-07 LAB — CBC WITH DIFFERENTIAL/PLATELET
Basophils Absolute: 0 10*3/uL (ref 0.0–0.1)
Eosinophils Absolute: 0 10*3/uL (ref 0.0–0.5)
HGB: 11.3 g/dL — ABNORMAL LOW (ref 13.0–17.1)
MCV: 99.4 fL — ABNORMAL HIGH (ref 79.3–98.0)
NEUT#: 0.4 10*3/uL — CL (ref 1.5–6.5)
RDW: 17 % — ABNORMAL HIGH (ref 11.0–14.6)
lymph#: 0.9 10*3/uL (ref 0.9–3.3)

## 2012-04-07 LAB — COMPREHENSIVE METABOLIC PANEL (CC13)
Albumin: 3.5 g/dL (ref 3.5–5.0)
BUN: 15.8 mg/dL (ref 7.0–26.0)
Calcium: 9.7 mg/dL (ref 8.4–10.4)
Chloride: 105 mEq/L (ref 98–107)
Glucose: 112 mg/dl — ABNORMAL HIGH (ref 70–99)
Potassium: 4 mEq/L (ref 3.5–5.1)

## 2012-04-07 NOTE — Progress Notes (Signed)
OFFICE PROGRESS NOTE   04/07/2012   Physicians: W.Alwyn Ren (PCP(, P.Jordan (cardiology), D.Grapey(urology), H.Coley (hand surgery), R.Groat (ophthalmology) New patient referral made now to Dr Herbert Moors [facility] hematology/ acute leukemia service, for appointment 04-08-12 at 1300.  INTERVAL HISTORY:   Patient is seen, alone for visit, in continuing close attention to recently diagnosed hairy cell leukemia, having begun treatment with cladribine as outpatient at [facility] on 04-05-12, planning weekly treatments x 6 due to pancytopenia on presentation  Patient was hospitalized from 3-13 thru 04-02-12 at [facility] in [place], after presenting to primary physician Dr Alwyn Ren with cellulitis and shaking chills <24 hours after his cat bit right hand at base of thumb. He was pancytopenic on admission. Bone marrow exam 03-29-2012 showed hypercellular marrow with B cell lymphoproliferative process, with flow cytometry consistent with hairy cell leukemia. Essentially no aspirate was obtained at that bone marrow exam. The RUE cellulitis progressively improved with IV antibiotics (vanc + zosyn initially, then zosyn alone) and he was otherwise stable while in hospital. All cultures were negative. He was followed in hospital by hand surgeon, with I&D x2 of the wound at thumb base. [facility] although I did discuss the case with attending physicians at [facility] centers. He did agree to outpatient consultation at [facility], which we have today been able to schedule with Dr Herbert Moors for 04-08-12. [name] was stable for discharge from hospital on 04-02-12 and has remained afebrile and stable at home on oral augmentin which will complete on 04-08-12. He began treatment with cladribine at [facility] on 04-05-12, tolerated well. The RUE cellulitis has resolved and the area at base of thumb has no swelling, tenderness or erythema.  Other than continuing to feel  tired, [name] feels that he is doing well. He has had no fever, no symptoms of infection, no discomfort at right hand or in area of previous dental abscess, no bleeding, no shortness of breath walking length of office hall, tho he was a little short of breath walking up hill in parking lot. He has intermittent light headedness on standing as he has had for several months, none today. No chest pain, no other pain. Minimal nausea x 1 today resolved when he ate lunch. No mucositis or esophagitis. Bowels are formed and normal. No discomfort at site of IV for chemo. No LE swelling. Remainder of 10 point Review of Systems negative.  Objective:  Vital signs in last 24 hours:  BP 136/74  Pulse 63  Temp(Src) 96.9 F (36.1 C) (Oral)  Resp 18  Ht 5\' 11"  (1.803 m)  Wt 198 lb 6.4 oz (89.994 kg)  BMI 27.68 kg/m2  Easily ambulatory, looks comfortable, respirations not labored, very pleasant as always.   HEENT:PERRLA, extra ocular movement intact, sclera clear, anicteric, oropharynx clear, no lesions and neck supple with midline trachea. Mucous membranes moist. LymphaticsCervical, supraclavicular, and axillary nodes normal. Resp: clear to auscultation bilaterally and normal percussion bilaterally Cardio: regular rate and rhythm GI: soft, nontender, normal bowel sounds, no appreciable HSM Extremities: LE without edema, cords, tenderness.  LUE not remarkable, with minimal bruise at site of IV used on 3-24, no cord or tenderness. No erythema at all now right forearm and moderately large ecchymosis in antecubital from hospital blood draws now entirely resolved. Minimal erythema without swelling across 1st and 2nd MCP joints, not tender. Area at base of thumb discolored but no longer erythema, no swelling, puncture area closed, not tender or hot.  No swelling otherwise hand or fingers. Slight desquamating skin on dorsum of hand (which I have told him not to pull off). Neuro: decreased auditory acuity as  baseline, otherwise nonfocal   Lab Results:  Results for orders placed in visit on 04/07/12  CBC WITH DIFFERENTIAL      Result Value Range   WBC 1.3 (*) 4.0 - 10.3 10e3/uL   NEUT# 0.4 (*) 1.5 - 6.5 10e3/uL   HGB 11.3 (*) 13.0 - 17.1 g/dL   HCT 45.4 (*) 09.8 - 11.9 %   Platelets 52 (*) 140 - 400 10e3/uL   MCV 99.4 (*) 79.3 - 98.0 fL   MCH 34.1 (*) 27.2 - 33.4 pg   MCHC 34.3  32.0 - 36.0 g/dL   RBC 1.47 (*) 8.29 - 5.62 10e6/uL   RDW 17.0 (*) 11.0 - 14.6 %   lymph# 0.9  0.9 - 3.3 10e3/uL   MONO# 0.0 (*) 0.1 - 0.9 10e3/uL   Eosinophils Absolute 0.0  0.0 - 0.5 10e3/uL   Basophils Absolute 0.0  0.0 - 0.1 10e3/uL   NEUT% 28.3 (*) 39.0 - 75.0 %   LYMPH% 69.8 (*) 14.0 - 49.0 %   MONO% 0.7  0.0 - 14.0 %   EOS% 0.8  0.0 - 7.0 %   BASO% 0.4  0.0 - 2.0 %  COMPREHENSIVE METABOLIC PANEL (CC13)      Result Value Range   Sodium 138  136 - 145 mEq/L   Potassium 4.0  3.5 - 5.1 mEq/L   Chloride 105  98 - 107 mEq/L   CO2 26  22 - 29 mEq/L   Glucose 112 (*) 70 - 99 mg/dl   BUN 13.0  7.0 - 86.5 mg/dL   Creatinine 1.1  0.7 - 1.3 mg/dL   Total Bilirubin 7.84  0.20 - 1.20 mg/dL   Alkaline Phosphatase 58  40 - 150 U/L   AST 17  5 - 34 U/L   ALT 19  0 - 55 U/L   Total Protein 7.1  6.4 - 8.3 g/dL   Albumin 3.5  3.5 - 5.0 g/dL   Calcium 9.7  8.4 - 69.6 mg/dL  URIC ACID (EX52)      Result Value Range   Uric Acid, Serum 6.7  2.6 - 7.4 mg/dl     Studies/Results:  No results found.  Medications: I have reviewed the patient's current medications. Augmentin for cat bite cellulitis completes 04-08-12 AM.  He may need PCP prophylaxis with Bactrim, or possibly prophylactic cipro, however since he is to be seen at [facility] on 3-27, I will see what recommendations they have. He has not been on allopurinol.  His insurance requires precert for zofran, in process. He has compazine on hand.   We have again discussed neutropenic precautions. We have spoken with [facility] now and appreciate offer of  appointment with Dr Sharyne Richters tomorrow. I have spoken with pathology, for bone marrow specimens to be sent to [facility] for his visit tomorrow. Records being faxed now. Directions given to patient.  Following patient's visit, I spoke directly with PCP Dr Alwyn Ren. Note he mentioned also some ETOH history.  Assessment/Plan:  1.Hairy cell leukemia:  Diagnosed by bone marrow  03-29-12 when patient presented with rapidly progressing cellulitis from cat bite and was pancytopenic. I have begun treatment with cladribine using 0.15 mg/kg weekly x 6 schedule, first dose 04-05-12. He will have consultation visit at [facility] on 04-08-12 and has visit back at this office on 3-28, then second  cladribine on 04-12-12 as long as [facility] feels this is appropriate. 2. CAD with MI and post CABG x4, HTN. On B-blocker per cardiology 3.recent dental abscess, resolved. 4.hx prostate cancer post radical prostatectomy 2008, "recent PSA undetectable" from urology note 10-2011 5.hx nephrolithiasis 6. Daily ETOH prior to hospitalization. He was on thiamine in hospital. I have asked him not to drink now. 7.hx GERD 8.Raynaud's 9. Past tobacco, DC 2006 Patient followed all of discussion well and was in agreement with plan, including consultation at [facility].   Arsenia Goracke P, MD   04/07/2012, 4:01 PM

## 2012-04-07 NOTE — Progress Notes (Signed)
Faxed pa form for ondansetron to [facility] @ [phone].

## 2012-04-07 NOTE — Patient Instructions (Addendum)
Call for temperature >=100.5 or any symptoms of infection     [phone]  Stay away from anyone who might be sick, including staying out of stores, restaurants, church. Wear mask if you are in any public places including Cancer Center or at [facility].  Leave bandage off of hand now.

## 2012-04-08 ENCOUNTER — Telehealth: Payer: Self-pay | Admitting: Oncology

## 2012-04-08 ENCOUNTER — Encounter: Payer: Self-pay | Admitting: Oncology

## 2012-04-08 NOTE — Progress Notes (Signed)
[employer] [place] approved ondansetron 8mg  from 04/07/12-04/08/13.

## 2012-04-08 NOTE — Telephone Encounter (Signed)
Add to previous note. Message to HIM re referral to [facility].

## 2012-04-08 NOTE — Telephone Encounter (Signed)
Added remaining wkly lb/tx appt per 3/26 pof. Pt will be given a new schedule when he comes in tomorrow. Pt has exisiting appts in the office 3/28, 3/31 and 4/4. Per email from [place] inf appts scheduled in the [place].

## 2012-04-09 ENCOUNTER — Other Ambulatory Visit: Payer: Self-pay | Admitting: Oncology

## 2012-04-09 ENCOUNTER — Other Ambulatory Visit: Payer: Self-pay

## 2012-04-09 ENCOUNTER — Ambulatory Visit (HOSPITAL_BASED_OUTPATIENT_CLINIC_OR_DEPARTMENT_OTHER): Payer: Medicare Other | Admitting: Physician Assistant

## 2012-04-09 ENCOUNTER — Other Ambulatory Visit (HOSPITAL_BASED_OUTPATIENT_CLINIC_OR_DEPARTMENT_OTHER): Payer: Medicare Other | Admitting: Lab

## 2012-04-09 ENCOUNTER — Encounter: Payer: Self-pay | Admitting: Physician Assistant

## 2012-04-09 VITALS — BP 133/80 | HR 68 | Temp 97.8°F | Resp 18 | Ht 71.0 in | Wt 197.6 lb

## 2012-04-09 DIAGNOSIS — L089 Local infection of the skin and subcutaneous tissue, unspecified: Secondary | ICD-10-CM

## 2012-04-09 DIAGNOSIS — C9140 Hairy cell leukemia not having achieved remission: Secondary | ICD-10-CM

## 2012-04-09 DIAGNOSIS — D709 Neutropenia, unspecified: Secondary | ICD-10-CM

## 2012-04-09 DIAGNOSIS — Z8249 Family history of ischemic heart disease and other diseases of the circulatory system: Secondary | ICD-10-CM

## 2012-04-09 LAB — CBC WITH DIFFERENTIAL/PLATELET
Eosinophils Absolute: 0 10*3/uL (ref 0.0–0.5)
MONO#: 0 10*3/uL — ABNORMAL LOW (ref 0.1–0.9)
MONO%: 0.2 % (ref 0.0–14.0)
NEUT#: 0.3 10*3/uL — CL (ref 1.5–6.5)
RBC: 3.25 10*6/uL — ABNORMAL LOW (ref 4.20–5.82)
RDW: 17.3 % — ABNORMAL HIGH (ref 11.0–14.6)
WBC: 1.1 10*3/uL — ABNORMAL LOW (ref 4.0–10.3)

## 2012-04-09 LAB — BASIC METABOLIC PANEL (CC13)
CO2: 24 mEq/L (ref 22–29)
Glucose: 122 mg/dl — ABNORMAL HIGH (ref 70–99)
Potassium: 4.1 mEq/L (ref 3.5–5.1)
Sodium: 137 mEq/L (ref 136–145)

## 2012-04-09 LAB — URIC ACID (CC13): Uric Acid, Serum: 6.5 mg/dl (ref 2.6–7.4)

## 2012-04-09 MED ORDER — FLUCONAZOLE 200 MG PO TABS: 200.0000 mg | ORAL_TABLET | Freq: Every day | ORAL | Status: DC

## 2012-04-09 MED ORDER — FILGRASTIM 480 MCG/0.8ML IJ SOLN
480.0000 ug | Freq: Once | INTRAMUSCULAR | Status: AC
  Administered 2012-04-09: 480 ug via SUBCUTANEOUS
  Filled 2012-04-09: qty 0.8

## 2012-04-09 MED ORDER — SULFAMETHOXAZOLE-TMP DS 800-160 MG PO TABS: ORAL_TABLET | ORAL | Status: DC

## 2012-04-09 MED ORDER — AMOXICILLIN-POT CLAVULANATE 875-125 MG PO TABS: 1.0000 | ORAL_TABLET | Freq: Two times a day (BID) | ORAL | Status: DC

## 2012-04-09 NOTE — Patient Instructions (Addendum)
Keep the appointment with the hand surgeon later this morning for reevaluation of your right hand infection Take your antibiotics and antifungals as prescribed Followup with Dr. Darrold Span. as previously scheduled on 04/12/2012

## 2012-04-09 NOTE — Progress Notes (Signed)
OFFICE PROGRESS NOTE   04/09/2012   Physicians: W.Alwyn Ren (PCP(, P.Jordan (cardiology), D.Grapey(urology), H.Coley (hand surgery), R.Groat (ophthalmology) New patient referral made now to Dr Herbert Moors [facility] ([facility]) hematology/ acute leukemia service, for appointment 04-08-12 at 1300.  INTERVAL HISTORY:   Patient is seen, alone for visit, in continuing close attention to recently diagnosed hairy cell leukemia, having begun treatment with cladribine as outpatient at [facility] on 04-05-12, planning weekly treatments x 6 due to pancytopenia on presentation. Patient was seen in consultation at [facility] by Dr. Herbert Moors. Dr. Sharyne Richters was in agreement with his current treatment, as well as in agreement with supportive transfusion as needed as well as prophylactic antibiotics, acyclovir and fluconazole and also the antibacterial therapy related to his right hand cellulitis status post cat bite. Dr. Sharyne Richters was contacted regarding prophylactic antibiotics and instead of Bactrim he recommended ciprofloxacin. Our [facility] pharmacists did an exhaustive review and the recommended prophylactic dose for Cipro would be 500 mg by mouth twice daily or an alternative would be Levaquin at 500 mg once daily.  Patient was hospitalized from 3-13 thru 04-02-12 at [facility] in [place], after presenting to primary physician Dr Alwyn Ren with cellulitis and shaking chills <24 hours after his cat bit right hand at base of thumb. He was pancytopenic on admission. Bone marrow exam 03-29-2012 showed hypercellular marrow with B cell lymphoproliferative process, with flow cytometry consistent with hairy cell leukemia. Essentially no aspirate was obtained at that bone marrow exam. The RUE cellulitis progressively improved with IV antibiotics (vanc + zosyn initially, then zosyn alone) and he was otherwise stable while in hospital. All cultures were negative. He was followed in  hospital by hand surgeon, with I&D x2 of the wound at thumb base. He declined transfer to university hospital although I did discuss the case with attending physicians at 3 university centers. He did agree to outpatient consultation at [facility], which we have today been able to schedule with Dr Herbert Moors for 04-08-12. Mr Seaman was stable for discharge from hospital on 04-02-12 and has remained afebrile and stable at home on oral augmentin which will complete on 04-08-12. He began treatment with cladribine at [facility] on 04-05-12, tolerated well. The RUE cellulitis has resolved and the area at base of thumb has no swelling, tenderness or erythema.  Mr. Dec reports some "irritation" about the right thumb that he feels may be secondary to his frequently putting his hand in an out of his pocket. He denied any fever or shaking chills. He is having some difficulty sleeping with periods of wakefulness but is able to return sleep. Reports that his appetite is good and bowels are moving regularly. He voiced no other specific complaints and tolerated his first cycle of cladribine relatively well. No LE swelling. Remainder of 10 point Review of Systems negative.  Objective:  Vital signs in last 24 hours:  BP 133/80  Pulse 68  Temp(Src) 97.8 F (36.6 C) (Oral)  Resp 18  Ht 5\' 11"  (1.803 m)  Wt 197 lb 9.6 oz (89.631 kg)  BMI 27.57 kg/m2  Easily ambulatory, looks comfortable, respirations not labored, very pleasant as always.   HEENT:PERRLA, extra ocular movement intact, sclera clear, anicteric, oropharynx clear, no lesions and neck supple with midline trachea. Mucous membranes moist. LymphaticsCervical, supraclavicular, and axillary nodes normal. Resp: clear to auscultation bilaterally and normal percussion bilaterally Cardio: regular rate and rhythm GI: soft, nontender, normal bowel sounds, no appreciable HSM Extremities: LE  without edema, cords, tenderness.  LUE not remarkable.with minimal bruise  at site of IV used on 3-24, no cord or tenderness. No erythema at all now right forearm and moderately large ecchymosis in antecubital from hospital blood draws now entirely resolved. Minimal erythema without swelling across 1st through 4th MCP joints, not tender. Area at base of thumb discolored, slightly warm and erythematous. No obvious areas of fluctuance palpated and the thumb is nontender. Puncture wounds have completely closed.there continues to be some slight desquamating skin on the dorsum of the hand was no evidence of superinfection. No swelling otherwise hand or fingers.  Neuro: decreased auditory acuity as baseline, otherwise nonfocal   Lab Results:  Results for orders placed in visit on 04/09/12  CBC WITH DIFFERENTIAL      Result Value Range   WBC 1.1 (*) 4.0 - 10.3 10e3/uL   NEUT# 0.3 (*) 1.5 - 6.5 10e3/uL   HGB 11.0 (*) 13.0 - 17.1 g/dL   HCT 82.9 (*) 56.2 - 13.0 %   Platelets 52 (*) 140 - 400 10e3/uL   MCV 99.7 (*) 79.3 - 98.0 fL   MCH 33.9 (*) 27.2 - 33.4 pg   MCHC 34.0  32.0 - 36.0 g/dL   RBC 8.65 (*) 7.84 - 6.96 10e6/uL   RDW 17.3 (*) 11.0 - 14.6 %   lymph# 0.8 (*) 0.9 - 3.3 10e3/uL   MONO# 0.0 (*) 0.1 - 0.9 10e3/uL   Eosinophils Absolute 0.0  0.0 - 0.5 10e3/uL   Basophils Absolute 0.0  0.0 - 0.1 10e3/uL   NEUT% 27.1 (*) 39.0 - 75.0 %   LYMPH% 72.2 (*) 14.0 - 49.0 %   MONO% 0.2  0.0 - 14.0 %   EOS% 0.3  0.0 - 7.0 %   BASO% 0.2  0.0 - 2.0 %  BASIC METABOLIC PANEL (CC13)      Result Value Range   Sodium 137  136 - 145 mEq/L   Potassium 4.1  3.5 - 5.1 mEq/L   Chloride 105  98 - 107 mEq/L   CO2 24  22 - 29 mEq/L   Glucose 122 (*) 70 - 99 mg/dl   BUN 29.5  7.0 - 28.4 mg/dL   Creatinine 1.1  0.7 - 1.3 mg/dL   Calcium 9.4  8.4 - 13.2 mg/dL  URIC ACID (GM01)      Result Value Range   Uric Acid, Serum 6.5  2.6 - 7.4 mg/dl     Studies/Results:  No results found.  Medications: I have reviewed the patient's current medications. Augmentin for cat bite cellulitis  completesd3-27-14 AM. The patient was discussed with Dr. Belva Bertin who also examined Mr. Myles Lipps right hand. She feels that the right thumb is actually a bit worse than it was earlier in the week and we therefore will place him on another 10 day course of Augmentin 875 mg by mouth twice daily. He is to continue on the acyclovir as prescribed as well as Diflucan 200 mg by mouth daily for the next 4 weeks with a refill which was called to his pharmacy of record. As mentioned above prophylactic antibiotic of choice as recommended by Dr. Sharyne Richters from [facility] is Cipro and our pharmacists feel that the prophylactic dose would be Cipro 500 mg by mouth twice daily or an alternative would be Levaquin 500 mg once daily. He is neutropenic today with an ANC of 0.3 a total white count of 1.1 platelets are stable at 52,000 with no evidence of bleeding or bruising.  Patient will see Neupogen 480 mcg subcutaneously daily. Dr. Darrold Span has arrange for Mr. Thielke to be reevaluated by the hand surgeon later this morning. He has not been on allopurinol.  His insurance requires precert for zofran, which has been approved. He has compazine on hand.   We have again discussed neutropenic precautions. ETOH history her patient's primary care physician, Dr. Alwyn Ren  Assessment/Plan:  1.Hairy cell leukemia:  Diagnosed by bone marrow  03-29-12 when patient presented with rapidly progressing cellulitis from cat bite and was pancytopenic. I have begun treatment with cladribine using 0.15 mg/kg weekly x 6 schedule, first dose 04-05-12. Status post consultation visit at [facility] on 04-08-12,  then second cladribine on 04-12-12 as scheduled as Marilynne Drivers is in agreement with the current treatment plan 2. CAD with MI and post CABG x4, HTN. On B-blocker per cardiology 3.recent dental abscess, resolved. 4.hx prostate cancer post radical prostatectomy 2008, "recent PSA undetectable" from urology note 10-2011 5.hx nephrolithiasis 6. Daily ETOH prior  to hospitalization. He was on thiamine in hospital. I have asked him not to drink now. 7.hx GERD 8.Raynaud's 9. Past tobacco, DC 2006 Patient followed all of discussion well and was in agreement with plan.   Tiana Loft E, PA-C   04/09/2012, 3:59 PM

## 2012-04-12 ENCOUNTER — Ambulatory Visit (HOSPITAL_BASED_OUTPATIENT_CLINIC_OR_DEPARTMENT_OTHER): Payer: Medicare Other

## 2012-04-12 ENCOUNTER — Encounter: Payer: Self-pay | Admitting: Oncology

## 2012-04-12 ENCOUNTER — Other Ambulatory Visit (HOSPITAL_BASED_OUTPATIENT_CLINIC_OR_DEPARTMENT_OTHER): Payer: Medicare Other | Admitting: Lab

## 2012-04-12 ENCOUNTER — Ambulatory Visit (HOSPITAL_BASED_OUTPATIENT_CLINIC_OR_DEPARTMENT_OTHER): Payer: Medicare Other | Admitting: Oncology

## 2012-04-12 ENCOUNTER — Encounter: Payer: Self-pay | Admitting: Nutrition

## 2012-04-12 VITALS — BP 120/66 | HR 62 | Temp 97.1°F | Resp 20 | Ht 71.0 in | Wt 197.0 lb

## 2012-04-12 DIAGNOSIS — C9140 Hairy cell leukemia not having achieved remission: Secondary | ICD-10-CM

## 2012-04-12 DIAGNOSIS — Z5111 Encounter for antineoplastic chemotherapy: Secondary | ICD-10-CM

## 2012-04-12 LAB — CBC WITH DIFFERENTIAL/PLATELET
BASO%: 0.7 % (ref 0.0–2.0)
LYMPH%: 64.6 % — ABNORMAL HIGH (ref 14.0–49.0)
MCHC: 33.8 g/dL (ref 32.0–36.0)
MCV: 97.4 fL (ref 79.3–98.0)
MONO#: 0 10*3/uL — ABNORMAL LOW (ref 0.1–0.9)
MONO%: 0.7 % (ref 0.0–14.0)
Platelets: 43 10*3/uL — ABNORMAL LOW (ref 140–400)
RBC: 3.52 10*6/uL — ABNORMAL LOW (ref 4.20–5.82)
RDW: 16.1 % — ABNORMAL HIGH (ref 11.0–14.6)
WBC: 1.4 10*3/uL — ABNORMAL LOW (ref 4.0–10.3)
nRBC: 0 % (ref 0–0)

## 2012-04-12 LAB — COMPREHENSIVE METABOLIC PANEL (CC13)
ALT: 17 U/L (ref 0–55)
AST: 15 U/L (ref 5–34)
Alkaline Phosphatase: 66 U/L (ref 40–150)
Creatinine: 1.1 mg/dL (ref 0.7–1.3)
Sodium: 138 mEq/L (ref 136–145)
Total Bilirubin: 0.59 mg/dL (ref 0.20–1.20)

## 2012-04-12 MED ORDER — ONDANSETRON 8 MG/50ML IVPB (CHCC)
8.0000 mg | Freq: Once | INTRAVENOUS | Status: AC
  Administered 2012-04-12: 8 mg via INTRAVENOUS

## 2012-04-12 MED ORDER — SODIUM CHLORIDE 0.9 % IV SOLN
Freq: Once | INTRAVENOUS | Status: AC
  Administered 2012-04-12: 12:00:00 via INTRAVENOUS

## 2012-04-12 MED ORDER — SODIUM CHLORIDE 0.9 % IV SOLN
0.1500 mg/kg | Freq: Once | INTRAVENOUS | Status: AC
  Administered 2012-04-12: 14 mg via INTRAVENOUS
  Filled 2012-04-12: qty 14

## 2012-04-12 NOTE — Progress Notes (Signed)
Okay to tx with CBC per Dr Cleophas Dunker, SLJ

## 2012-04-12 NOTE — Progress Notes (Signed)
OFFICE PROGRESS NOTE   04/12/2012   Physicians: T.Pardee, W.Hopper, H.Coley, P.Jordan, D.Grapey, R.Groat  INTERVAL HISTORY:  Patient is seen, alone for visit, in continuing attention to his hairy cell leukemia, due second treatment of weekly cladribine today. He remains neutropenic and thrombocytopenic, but has had no fever or bleeding. He had the area of the cat bite I & D'd again by Dr Izora Ribas on 04-09-12, and remains on augmentin (present prescription for another 6 days). He had neupogen on 3-28 when the cat bite area was more concerning and ANC was 0.3.  Patient was hospitalized from 3-13 thru 04-02-12 at [facility] in [place], after presenting to primary physician Dr Alwyn Ren with cellulitis and shaking chills <24 hours after his cat bit right hand at base of thumb. He was pancytopenic on admission. Bone marrow exam 03-29-2012 showed hypercellular marrow with B cell lymphoproliferative process, with flow cytometry consistent with hairy cell leukemia. Essentially no aspirate was obtained at that bone marrow exam. The RUE cellulitis progressively improved with IV antibiotics (vanc + zosyn initially, then zosyn alone) and he was otherwise stable while in hospital. All cultures were negative. He was followed in hospital by hand surgeon, with I&D x2 of the wound at thumb base. He declined transfer to university hospital although I did discuss the case with attending physicians at 3 university centers. He saw  Dr Herbert Moors at [facility] on 04-08-12 in consultation, with agreement concerning treatment plan here.  He began cladribine at [facility] on 04-05-12, planned weekly x6 instead of single continuous infusion course due to pancytopenia at diagnosis.   He has not had any new or different problems since seen on 3-28. The area of the cat bite improved after drained again by Dr Izora Ribas after visit here on 3-28, with little discomfort and no swelling or drainage there now. He has slightly loose stools,  seems to be with the augmentin. He has taste disturbance, tho still likes sweets, does not seem to tolerate dairy products now. He has not had frank nausea and has not used antiemetics. He denies shortness of breath, pain, rash, mucositis or dental symptoms. He is not drinking usual daily wine. Remainder of 10 point Review of Systems negative.  Objective:  Vital signs in last 24 hours:  BP 120/66  Pulse 62  Temp(Src) 97.1 F (36.2 C) (Oral)  Resp 20  Ht 5\' 11"  (1.803 m)  Wt 197 lb (89.359 kg)  BMI 27.49 kg/m2  Weight is stable. Easily ambulatory, looks more comfortable today, respirations not labored.  HEENT:PERRLA, extra ocular movement intact, sclera clear, anicteric and oropharynx clear, no lesions LymphaticsCervical, supraclavicular, and axillary nodes normal. Resp: clear to auscultation bilaterally and normal percussion bilaterally Cardio: regular rate and rhythm GI: soft, non-tender; bowel sounds normal; no masses,  no organomegaly Extremities: unremarkable other than right hand, with area of bite no swelling or tenderness now, minimal erythema at site only. Knuckles not erythematous, no swelling, superficial skin desquamation minimal now. No petechiae and area of ecchymosis antecubital resolved. Neuro:chronic deafness otherwise nonfocal    Lab Results:  Results for orders placed in visit on 04/12/12  CBC WITH DIFFERENTIAL      Result Value Range   WBC 1.4 (*) 4.0 - 10.3 10e3/uL   NEUT# 0.5 (*) 1.5 - 6.5 10e3/uL   HGB 11.6 (*) 13.0 - 17.1 g/dL   HCT 16.1 (*) 09.6 - 04.5 %   Platelets 43 (*) 140 - 400 10e3/uL   MCV 97.4  79.3 - 98.0  fL   MCH 33.0  27.2 - 33.4 pg   MCHC 33.8  32.0 - 36.0 g/dL   RBC 4.78 (*) 2.95 - 6.21 10e6/uL   RDW 16.1 (*) 11.0 - 14.6 %   lymph# 0.9  0.9 - 3.3 10e3/uL   MONO# 0.0 (*) 0.1 - 0.9 10e3/uL   Eosinophils Absolute 0.0  0.0 - 0.5 10e3/uL   Basophils Absolute 0.0  0.0 - 0.1 10e3/uL   NEUT% 33.3 (*) 39.0 - 75.0 %   LYMPH% 64.6 (*) 14.0 -  49.0 %   MONO% 0.7  0.0 - 14.0 %   EOS% 0.7  0.0 - 7.0 %   BASO% 0.7  0.0 - 2.0 %   nRBC 0  0 - 0 %  COMPREHENSIVE METABOLIC PANEL (CC13)      Result Value Range   Sodium 138  136 - 145 mEq/L   Potassium 4.4  3.5 - 5.1 mEq/L   Chloride 104  98 - 107 mEq/L   CO2 24  22 - 29 mEq/L   Glucose 95  70 - 99 mg/dl   BUN 30.8  7.0 - 65.7 mg/dL   Creatinine 1.1  0.7 - 1.3 mg/dL   Total Bilirubin 8.46  0.20 - 1.20 mg/dL   Alkaline Phosphatase 66  40 - 150 U/L   AST 15  5 - 34 U/L   ALT 17  0 - 55 U/L   Total Protein 7.4  6.4 - 8.3 g/dL   Albumin 3.8  3.5 - 5.0 g/dL   Calcium 9.8  8.4 - 96.2 mg/dL    Hgb above is up from 11.0 on 3-28. Platelets had been 52k on 3-28, ANC then 0.3 and WBC 1.1  Studies/Results:  No results found.  Medications: I have reviewed the patient's current medications. Augmentin resumed 3-28 with script for 10 days then. He is not on cipro or levaquin prophylactically since on augmentin now. He continues acyclovir and diflucan. Consider adding Bactrim DS equivalent qd - bid MWF. He has folate in MVI.  Assessment/Plan:  1.Hairy cell leukemia: Diagnosed by bone marrow 03-29-12 when patient presented with rapidly progressing cellulitis from cat bite and was pancytopenic.  treatment begun with cladribine using 0.15 mg/kg weekly x 6 schedule, first dose 04-05-12. He had consultation visit at [facility] on 04-08-12 and will have second cladribine today. He remains neutropenic and thrombocytopenic, tho hgb is just a little better. He understands neutropenic precautions and will call if temp >=100.5 or other problems.  2. CAD with MI and post CABG x4, HTN. On B-blocker per cardiology  3.recent dental abscess, resolved.  4.hx prostate cancer post radical prostatectomy 2008, "recent PSA undetectable" from urology note 10-2011  5.hx nephrolithiasis  6. Daily ETOH prior to hospitalization, tells me that he is not drinking now. 7.hx GERD  8.Raynaud's  9. Past tobacco, DC  2006    Kegan Mckeithan P, MD   04/12/2012, 7:41 PM

## 2012-04-12 NOTE — Patient Instructions (Signed)
Continue neutropenic and bleeding precautions.  Call for fever >=100.5, bleeding or any other problems

## 2012-04-12 NOTE — Patient Instructions (Addendum)
[facility] Discharge Instructions for Patients Receiving Chemotherapy  Today you received the following chemotherapy agents leustatin  To help prevent nausea and vomiting after your treatment, we encourage you to take your nausea medication as needed   If you develop nausea and vomiting that is not controlled by your nausea medication, call the clinic. If it is after clinic hours your family physician or the after hours number for the clinic or go to the Emergency Department.   BELOW ARE SYMPTOMS THAT SHOULD BE REPORTED IMMEDIATELY:  *FEVER GREATER THAN 100.5 F  *CHILLS WITH OR WITHOUT FEVER  NAUSEA AND VOMITING THAT IS NOT CONTROLLED WITH YOUR NAUSEA MEDICATION  *UNUSUAL SHORTNESS OF BREATH  *UNUSUAL BRUISING OR BLEEDING  TENDERNESS IN MOUTH AND THROAT WITH OR WITHOUT PRESENCE OF ULCERS  *URINARY PROBLEMS  *BOWEL PROBLEMS  UNUSUAL RASH Items with * indicate a potential emergency and should be followed up as soon as possible.   The clinic phone number is [phone].   Neutropenia Neutropenia is a condition that occurs when the level of a certain type of white blood cell (neutrophil) in your body becomes lower than normal. Neutrophils are made in the bone marrow and fight infections. These cells protect against bacteria and viruses. The fewer neutrophils you have, and the longer your body remains without them, the greater your risk of getting a severe infection becomes. CAUSES  The cause of neutropenia may be hard to determine. However, it is usually due to 3 main problems:   Decreased production of neutrophils. This may be due to:  Certain medicines such as chemotherapy.  Genetic problems.  Cancer.  Radiation treatments.  Vitamin deficiency.  Some pesticides.  Increased destruction of neutrophils. This may be due to:  Overwhelming infections.  Hemolytic anemia. This is when the body destroys its own blood  cells.  Chemotherapy.  Neutrophils moving to areas of the body where they cannot fight infections. This may be due to:  Dialysis procedures.  Conditions where the spleen becomes enlarged. Neutrophils are held in the spleen and are not available to the rest of the body.  Overwhelming infections. The neutrophils are held in the area of the infection and are not available to the rest of the body. SYMPTOMS  There are no specific symptoms of neutropenia. The lack of neutrophils can result in an infection, and an infection can cause various problems. DIAGNOSIS  Diagnosis is made by a blood test. A complete blood count is performed. The normal level of neutrophils in human blood differs with age and race. Infants have lower counts than older children and adults. African Americans have lower counts than Caucasians or Asians. The average adult level is 1500 cells/mm3 of blood. Neutrophil counts are interpreted as follows:  Greater than 1000 cells/mm3 gives normal protection against infection.  500 to 1000 cells/mm3 gives an increased risk for infection.  200 to 500 cells/mm3 is a greater risk for severe infection.  Lower than 200 cells/mm3 is a marked risk of infection. This may require hospitalization and treatment with antibiotic medicines. TREATMENT  Treatment depends on the underlying cause, severity, and presence of infections or symptoms. It also depends on your health. Your caregiver will discuss the treatment plan with you. Mild cases are often easily treated and have a good outcome. Preventative measures may also be started to limit your risk of infections. Treatment can include:  Taking antibiotics.  Stopping medicines that are known to cause neutropenia.  Correcting nutritional deficiencies by eating green  vegetables to supply folic acid and taking vitamin B supplements.  Stopping exposure to pesticides if your neutropenia is related to pesticide exposure.  Taking a blood growth  factor called sargramostim, pegfilgrastim, or filgrastim if you are undergoing chemotherapy for cancer. This stimulates white blood cell production.  Removal of the spleen if you have Felty's syndrome and have repeated infections. HOME CARE INSTRUCTIONS   Follow your caregiver's instructions about when you need to have blood work done.  Wash your hands often. Make sure others who come in contact with you also wash their hands.  Wash raw fruits and vegetables before eating them. They can carry bacteria and fungi.  Avoid people with colds or spreadable (contagious) diseases (chickenpox, herpes zoster, influenza).  Avoid large crowds.  Avoid construction areas. The dust can release fungus into the air.  Be cautious around children in daycare or school environments.  Take care of your respiratory system by coughing and deep breathing.  Bathe daily.  Protect your skin from cuts and Babygirl Trager.  Do not work in the garden or with flowers and plants.  Care for the mouth before and after meals by brushing with a soft toothbrush. If you have mucositis, do not use mouthwash. Mouthwash contains alcohol and can dry out the mouth even more.  Clean the area between the genitals and the anus (perineal area) after urination and bowel movements. Women need to wipe from front to back.  Use a water soluble lubricant during sexual intercourse and practice good hygiene after. Do not have intercourse if you are severely neutropenic. Check with your caregiver for guidelines.  Exercise daily as tolerated.  Avoid people who were vaccinated with a live vaccine in the past 30 days. You should not receive live vaccines (polio, typhoid).  Do not provide direct care for pets. Avoid animal droppings. Do not clean litter boxes and bird cages.  Do not share food utensils.  Do not use tampons, enemas, or rectal suppositories unless directed by your caregiver.  Use an electric razor to remove hair.  Wash your  hands after handling magazines, letters, and newspapers. SEEK IMMEDIATE MEDICAL CARE IF:   You have a fever.  You have chills or start to shake.  You feel nauseous or vomit.  You develop mouth sores.  You develop aches and pains.  You have redness and swelling around open wounds.  Your skin is warm to the touch.  You have pus coming from your wounds.  You develop swollen lymph nodes.  You feel weak or fatigued.  You develop red streaks on the skin. MAKE SURE YOU:  Understand these instructions.  Will watch your condition.  Will get help right away if you are not doing well or get worse. Document Released: 06/21/2001 Document Revised: 03/24/2011 Document Reviewed: 07/19/2010 [facility] Patient Information 2013 [place], [place].

## 2012-04-12 NOTE — Progress Notes (Signed)
Contacted patient by telephone to discuss taste alterations per physician request. Patient states he noticed a metallic taste while eating beef. He reports approximately 20 pounds weight loss. He feels like he is eating well. In the past he attempted to follow a heart healthy diet. He also has questions about foods to eat to help build white blood cells. I educated patient on strategies for increasing calories and protein in small frequent meals throughout the day. I have encouraged patient to choose higher fat, heart healthy snacks. I have educated him on strategies for improving taste. I have also educated him on strategies for food safety. I've answered his questions. I will mail fact sheets with this information and my contact information for patient to refer to. I will attempt followup with patient during chemotherapy in the future.

## 2012-04-13 ENCOUNTER — Telehealth: Payer: Self-pay | Admitting: *Deleted

## 2012-04-13 ENCOUNTER — Ambulatory Visit (HOSPITAL_BASED_OUTPATIENT_CLINIC_OR_DEPARTMENT_OTHER): Payer: Medicare Other

## 2012-04-13 ENCOUNTER — Telehealth: Payer: Self-pay

## 2012-04-13 ENCOUNTER — Other Ambulatory Visit: Payer: Self-pay

## 2012-04-13 VITALS — BP 141/70 | HR 60 | Temp 98.2°F

## 2012-04-13 DIAGNOSIS — C9140 Hairy cell leukemia not having achieved remission: Secondary | ICD-10-CM

## 2012-04-13 DIAGNOSIS — D709 Neutropenia, unspecified: Secondary | ICD-10-CM

## 2012-04-13 DIAGNOSIS — L089 Local infection of the skin and subcutaneous tissue, unspecified: Secondary | ICD-10-CM

## 2012-04-13 MED ORDER — SULFAMETHOXAZOLE-TRIMETHOPRIM 400-80 MG PO TABS: ORAL_TABLET | ORAL | Status: DC

## 2012-04-13 MED ORDER — FILGRASTIM 480 MCG/0.8ML IJ SOLN
480.0000 ug | Freq: Once | INTRAMUSCULAR | Status: AC
  Administered 2012-04-13: 480 ug via SUBCUTANEOUS
  Filled 2012-04-13: qty 0.8

## 2012-04-13 NOTE — Telephone Encounter (Signed)
Told [name] the need for the neupogen and bactrim as noted below by Dr. Darrold Span. Pt. prefers the regular strength bactrim tablets.  Prescription electronically sent to [facility]. Appointment made for 1415 for injection. [name] is continuing to do fine.  He is tired of taking pills but will if absolutely necessary.

## 2012-04-13 NOTE — Telephone Encounter (Signed)
Message copied by Lorine Bears on Tue Apr 13, 2012 11:05 AM ------      Message from: Reece Packer      Created: Mon Apr 12, 2012  8:26 PM       Please let him know that Dr Sharyne Richters sent another note and thinks we should add an antibiotic to try to prevent a specific type of lung infection while his white blood cells are low, Bactrim regular strength 2 tablets bid MWF # 48 (or one DS bid if he prefers, but those are very large tablets).      I would also like to give neupogen again on 4-1 or 4-2.  Order is in for 4-1, can move date if needed.      Please also be sure he is otherwise ok since rx on 3-31.  thanks             ------

## 2012-04-13 NOTE — Telephone Encounter (Signed)
Per staff message and POF I have scheduled appts.  JMW  

## 2012-04-16 ENCOUNTER — Other Ambulatory Visit (HOSPITAL_BASED_OUTPATIENT_CLINIC_OR_DEPARTMENT_OTHER): Payer: Medicare Other | Admitting: Lab

## 2012-04-16 ENCOUNTER — Ambulatory Visit (HOSPITAL_BASED_OUTPATIENT_CLINIC_OR_DEPARTMENT_OTHER): Payer: Medicare Other | Admitting: Oncology

## 2012-04-16 ENCOUNTER — Encounter: Payer: Self-pay | Admitting: Oncology

## 2012-04-16 VITALS — BP 111/69 | HR 61 | Temp 97.4°F | Resp 18 | Ht 71.0 in | Wt 199.8 lb

## 2012-04-16 DIAGNOSIS — C9140 Hairy cell leukemia not having achieved remission: Secondary | ICD-10-CM

## 2012-04-16 DIAGNOSIS — D696 Thrombocytopenia, unspecified: Secondary | ICD-10-CM

## 2012-04-16 DIAGNOSIS — Z8546 Personal history of malignant neoplasm of prostate: Secondary | ICD-10-CM

## 2012-04-16 DIAGNOSIS — D709 Neutropenia, unspecified: Secondary | ICD-10-CM

## 2012-04-16 LAB — CBC WITH DIFFERENTIAL/PLATELET
Basophils Absolute: 0 10*3/uL (ref 0.0–0.1)
Eosinophils Absolute: 0 10*3/uL (ref 0.0–0.5)
HCT: 30.7 % — ABNORMAL LOW (ref 38.4–49.9)
HGB: 10.4 g/dL — ABNORMAL LOW (ref 13.0–17.1)
LYMPH%: 63.6 % — ABNORMAL HIGH (ref 14.0–49.0)
MCHC: 33.9 g/dL (ref 32.0–36.0)
MONO#: 0 10*3/uL — ABNORMAL LOW (ref 0.1–0.9)
NEUT#: 0.4 10*3/uL — CL (ref 1.5–6.5)
NEUT%: 34.8 % — ABNORMAL LOW (ref 39.0–75.0)
Platelets: 42 10*3/uL — ABNORMAL LOW (ref 140–400)
WBC: 1.2 10*3/uL — ABNORMAL LOW (ref 4.0–10.3)
lymph#: 0.8 10*3/uL — ABNORMAL LOW (ref 0.9–3.3)

## 2012-04-16 LAB — COMPREHENSIVE METABOLIC PANEL (CC13)
ALT: 15 U/L (ref 0–55)
BUN: 12.7 mg/dL (ref 7.0–26.0)
CO2: 25 mEq/L (ref 22–29)
Calcium: 8.8 mg/dL (ref 8.4–10.4)
Chloride: 106 mEq/L (ref 98–107)
Creatinine: 1 mg/dL (ref 0.7–1.3)
Glucose: 112 mg/dl — ABNORMAL HIGH (ref 70–99)
Total Bilirubin: 0.46 mg/dL (ref 0.20–1.20)

## 2012-04-16 NOTE — Progress Notes (Signed)
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 He is still pancytopenic, but has had no fever, no bleeding and is not more symptomatic from the anemia. He continues augmentin for the cat bite cellulitis, that prescription to complete this weekend.   Patient was hospitalized from 3-13 thru 04-02-12 at [facility] in [place], after presenting to primary physician Dr Alwyn Ren with cellulitis and shaking chills <24 hours after his cat bit right hand at base of thumb. He was pancytopenic on admission. Bone marrow exam 03-29-2012 showed hypercellular marrow with B cell lymphoproliferative process, with flow cytometry consistent with hairy cell leukemia. Essentially no aspirate was obtained at that bone marrow exam. The RUE cellulitis progressively improved with IV antibiotics (vanc + zosyn initially, then zosyn alone) and he was otherwise stable while in hospital. All cultures were negative. He was followed in hospital by hand surgeon, with I&D x2 of the wound at thumb base. [facility] although I did discuss the case with attending physicians at [facility]. He saw Dr Herbert Moors at [facility] on 04-08-12 in consultation, with agreement concerning treatment plan here. He began cladribine at [facility] on 04-05-12, planned weekly x6 instead of single continuous infusion course due to pancytopenia at diagnosis.  Patient tells me that he has done well overall since I saw him last on 04-12-12, with no new or different symptoms. The area of the cat bite puncture at base of thumb is still sensitive to touch and slightly erythematous without worsening, no increased swelling. Appetite is fairly good and he has not needed antiemetics yet. He is a little constipated  now and may need stool softener. Peripheral IV access has been easily accomplished. He is slightly SOB walking in from parking lot as he has been, not SOB at rest. We have again discussed neutropenic precautions from standpoint of diet. Remainder of 10 point Review of Systems negative.  Objective:  Vital signs in last 24 hours:  BP 111/69  Pulse 61  Temp(Src) 97.4 F (36.3 C) (Oral)  Resp 18  Ht 5\' 11"  (1.803 m)  Wt 199 lb 12.8 oz (90.629 kg)  BMI 27.88 kg/m2 Weight is down 1 lb. Alert, fully oriented and appropriate, easily ambulatory, looks comfortable.   HEENT:PERRLA, extra ocular movement intact, sclera clear, anicteric and oropharynx clear, no lesions Lymphaticsno cervical or supraclavicular adenopathy Resp: clear to auscultation bilaterally Cardio: regular rate and rhythm GI: soft, non-tender; bowel sounds normal; no masses,  no organomegaly Extremities: extremities normal, atraumatic, no cyanosis or edema  With exception of RUE with mild erythema base of thumb dorsally, less skin desquamation, no swelling or open wound, minimally tender without change. Minimal erythema now across knuckles and none otherwise hand or arm. Neuro:chronic deafness only Skin without rash, petechiae or ecchymoses.  Lab Results:  Results for orders placed in visit on 04/16/12  CBC WITH DIFFERENTIAL      Result Value Range   WBC 1.2 (*) 4.0 - 10.3 10e3/uL   NEUT# 0.4 (*) 1.5 - 6.5 10e3/uL   HGB 10.4 (*) 13.0 - 17.1 g/dL   HCT 54.0 (*) 98.1 - 19.1 %   Platelets 42 (*) 140 - 400 10e3/uL   MCV 99.0 (*) 79.3 - 98.0 fL   MCH 33.5 (*) 27.2 - 33.4 pg  MCHC 33.9  32.0 - 36.0 g/dL   RBC 1.47 (*) 8.29 - 5.62 10e6/uL   RDW 16.1 (*) 11.0 - 14.6 %   lymph# 0.8 (*) 0.9 - 3.3 10e3/uL   MONO# 0.0 (*) 0.1 - 0.9 10e3/uL   Eosinophils Absolute 0.0  0.0 - 0.5 10e3/uL   Basophils Absolute 0.0  0.0 - 0.1 10e3/uL   NEUT% 34.8 (*) 39.0 - 75.0 %   LYMPH% 63.6 (*) 14.0 - 49.0 %   MONO% 0.8  0.0 - 14.0 %   EOS%  0.8  0.0 - 7.0 %   BASO% 0.0  0.0 - 2.0 %  COMPREHENSIVE METABOLIC PANEL (CC13)      Result Value Range   Sodium 139  136 - 145 mEq/L   Potassium 4.3  3.5 - 5.1 mEq/L   Chloride 106  98 - 107 mEq/L   CO2 25  22 - 29 mEq/L   Glucose 112 (*) 70 - 99 mg/dl   BUN 13.0  7.0 - 86.5 mg/dL   Creatinine 1.0  0.7 - 1.3 mg/dL   Total Bilirubin 7.84  0.20 - 1.20 mg/dL   Alkaline Phosphatase 57  40 - 150 U/L   AST 16  5 - 34 U/L   ALT 15  0 - 55 U/L   Total Protein 6.4  6.4 - 8.3 g/dL   Albumin 3.3 (*) 3.5 - 5.0 g/dL   Calcium 8.8  8.4 - 69.6 mg/dL     Studies/Results:  No results found.  Medications: I have reviewed the patient's current medications. After he completes Augmentin, as long as the thumb area is stable, we will begin prophylactic cipro; he is to continue diflucan and acyclovir. He began TMPSMX just today, that prophylactic MWF. Add colace prn.  Assessment/Plan:  1.Hairy cell leukemia: Diagnosed by bone marrow 03-29-12 when patient presented with rapidly progressing cellulitis from cat bite and was pancytopenic. Treatment was begun with cladribine using 0.15 mg/kg weekly x 6 schedule, first dose 04-05-12. He had consultation visit at [facility] on 04-08-12 and second cladribine on 04-12-12. He remains neutropenic and thrombocytopenic. He understands neutropenic precautions and will call if temp >=100.5 or other problems.  2. CAD with MI and post CABG x4, HTN. On B-blocker per cardiology  3.recent dental abscess, resolved.  4.hx prostate cancer post radical prostatectomy 2008, "recent PSA undetectable" from urology note 10-2011  5.hx nephrolithiasis  6. Daily ETOH prior to hospitalization, tells me that he is not drinking now.  7.hx GERD  8.Raynaud's  9. Past tobacco, DC 2006  He will have third cladribine on 04-19-12 with provider visit same day, and will see MD with counts also on 04-23-12.  LIVESAY,LENNIS P, MD   04/16/2012, 2:42 PM

## 2012-04-16 NOTE — Patient Instructions (Signed)
Stool softener such as colace fine to use

## 2012-04-19 ENCOUNTER — Other Ambulatory Visit (HOSPITAL_BASED_OUTPATIENT_CLINIC_OR_DEPARTMENT_OTHER): Payer: Medicare Other

## 2012-04-19 ENCOUNTER — Other Ambulatory Visit: Payer: Self-pay | Admitting: Oncology

## 2012-04-19 ENCOUNTER — Ambulatory Visit (HOSPITAL_BASED_OUTPATIENT_CLINIC_OR_DEPARTMENT_OTHER): Payer: Medicare Other | Admitting: Physician Assistant

## 2012-04-19 ENCOUNTER — Other Ambulatory Visit: Payer: Self-pay

## 2012-04-19 ENCOUNTER — Ambulatory Visit (HOSPITAL_BASED_OUTPATIENT_CLINIC_OR_DEPARTMENT_OTHER): Payer: Medicare Other

## 2012-04-19 VITALS — BP 121/70 | HR 56 | Temp 97.0°F | Resp 20 | Ht 71.0 in | Wt 195.3 lb

## 2012-04-19 DIAGNOSIS — C9140 Hairy cell leukemia not having achieved remission: Secondary | ICD-10-CM

## 2012-04-19 DIAGNOSIS — Z8546 Personal history of malignant neoplasm of prostate: Secondary | ICD-10-CM

## 2012-04-19 DIAGNOSIS — D61818 Other pancytopenia: Secondary | ICD-10-CM

## 2012-04-19 DIAGNOSIS — Z5111 Encounter for antineoplastic chemotherapy: Secondary | ICD-10-CM

## 2012-04-19 LAB — COMPREHENSIVE METABOLIC PANEL (CC13)
ALT: 19 U/L (ref 0–55)
CO2: 25 mEq/L (ref 22–29)
Calcium: 9.7 mg/dL (ref 8.4–10.4)
Chloride: 103 mEq/L (ref 98–107)
Glucose: 103 mg/dl — ABNORMAL HIGH (ref 70–99)
Sodium: 139 mEq/L (ref 136–145)
Total Protein: 7.1 g/dL (ref 6.4–8.3)

## 2012-04-19 LAB — CBC WITH DIFFERENTIAL/PLATELET
BASO%: 0 % (ref 0.0–2.0)
Eosinophils Absolute: 0 10*3/uL (ref 0.0–0.5)
HCT: 33.7 % — ABNORMAL LOW (ref 38.4–49.9)
LYMPH%: 79.2 % — ABNORMAL HIGH (ref 14.0–49.0)
MCHC: 33.8 g/dL (ref 32.0–36.0)
MONO#: 0 10*3/uL — ABNORMAL LOW (ref 0.1–0.9)
NEUT#: 0.2 10*3/uL — CL (ref 1.5–6.5)
NEUT%: 19.8 % — ABNORMAL LOW (ref 39.0–75.0)
Platelets: 57 10*3/uL — ABNORMAL LOW (ref 140–400)
RBC: 3.46 10*6/uL — ABNORMAL LOW (ref 4.20–5.82)
WBC: 1 10*3/uL — ABNORMAL LOW (ref 4.0–10.3)
lymph#: 0.8 10*3/uL — ABNORMAL LOW (ref 0.9–3.3)

## 2012-04-19 LAB — URIC ACID (CC13): Uric Acid, Serum: 6.9 mg/dl (ref 2.6–7.4)

## 2012-04-19 MED ORDER — LEVOFLOXACIN 500 MG PO TABS
500.0000 mg | ORAL_TABLET | Freq: Every day | ORAL | Status: DC
Start: 2012-04-19 — End: 2012-05-18

## 2012-04-19 MED ORDER — SODIUM CHLORIDE 0.9 % IV SOLN
0.1500 mg/kg | Freq: Once | INTRAVENOUS | Status: AC
  Administered 2012-04-19: 14 mg via INTRAVENOUS
  Filled 2012-04-19: qty 14

## 2012-04-19 MED ORDER — SODIUM CHLORIDE 0.9 % IV SOLN
Freq: Once | INTRAVENOUS | Status: AC
  Administered 2012-04-19: 15:00:00 via INTRAVENOUS

## 2012-04-19 MED ORDER — ONDANSETRON 8 MG/50ML IVPB (CHCC)
8.0000 mg | Freq: Once | INTRAVENOUS | Status: AC
  Administered 2012-04-19: 8 mg via INTRAVENOUS

## 2012-04-19 NOTE — Progress Notes (Unsigned)
Ok to treat with ANC 0.2 and platelets 54 per Dr. Darrold Span

## 2012-04-19 NOTE — Patient Instructions (Addendum)
Wear mask as instructed Followup with Dr. Darrold Span. as scheduled on 04/23/2012

## 2012-04-19 NOTE — Patient Instructions (Addendum)
[facility] Discharge Instructions for Patients Receiving Chemotherapy  Today you received the following chemotherapy agents Leustatin To help prevent nausea and vomiting after your treatment, we encourage you to take your nausea medication as prescribed.  If you develop nausea and vomiting that is not controlled by your nausea medication, call the clinic. If it is after clinic hours your family physician or the after hours number for the clinic or go to the Emergency Department.   BELOW ARE SYMPTOMS THAT SHOULD BE REPORTED IMMEDIATELY:  *FEVER GREATER THAN 100.5 F  *CHILLS WITH OR WITHOUT FEVER  NAUSEA AND VOMITING THAT IS NOT CONTROLLED WITH YOUR NAUSEA MEDICATION  *UNUSUAL SHORTNESS OF BREATH  *UNUSUAL BRUISING OR BLEEDING  TENDERNESS IN MOUTH AND THROAT WITH OR WITHOUT PRESENCE OF ULCERS  *URINARY PROBLEMS  *BOWEL PROBLEMS  UNUSUAL RASH Items with * indicate a potential emergency and should be followed up as soon as possible.  One of the nurses will contact you 24 hours after your treatment. Please let the nurse know about any problems that you may have experienced. Feel free to call the clinic you have any questions or concerns. The clinic phone number is [phone].   I have been informed and understand all the instructions given to me. I know to contact the clinic, my physician, or go to the Emergency Department if any problems should occur. I do not have any questions at this time, but understand that I may call the clinic during office hours   should I have any questions or need assistance in obtaining follow up care.    __________________________________________  _____________  __________ Signature of Patient or Authorized Representative            Date                   Time    __________________________________________ Nurse's Signature   Neutropenia Neutropenia is a condition that occurs when the level of a certain type of white blood cell  (neutrophil) in your body becomes lower than normal. Neutrophils are made in the bone marrow and fight infections. These cells protect against bacteria and viruses. The fewer neutrophils you have, and the longer your body remains without them, the greater your risk of getting a severe infection becomes. CAUSES  The cause of neutropenia may be hard to determine. However, it is usually due to 3 main problems:   Decreased production of neutrophils. This may be due to:  Certain medicines such as chemotherapy.  Genetic problems.  Cancer.  Radiation treatments.  Vitamin deficiency.  Some pesticides.  Increased destruction of neutrophils. This may be due to:  Overwhelming infections.  Hemolytic anemia. This is when the body destroys its own blood cells.  Chemotherapy.  Neutrophils moving to areas of the body where they cannot fight infections. This may be due to:  Dialysis procedures.  Conditions where the spleen becomes enlarged. Neutrophils are held in the spleen and are not available to the rest of the body.  Overwhelming infections. The neutrophils are held in the area of the infection and are not available to the rest of the body. SYMPTOMS  There are no specific symptoms of neutropenia. The lack of neutrophils can result in an infection, and an infection can cause various problems. DIAGNOSIS  Diagnosis is made by a blood test. A complete blood count is performed. The normal level of neutrophils in human blood differs with age and race. Infants have lower counts than older children and  adults. African Americans have lower counts than Caucasians or Asians. The average adult level is 1500 cells/mm3 of blood. Neutrophil counts are interpreted as follows:  Greater than 1000 cells/mm3 gives normal protection against infection.  500 to 1000 cells/mm3 gives an increased risk for infection.  200 to 500 cells/mm3 is a greater risk for severe infection.  Lower than 200 cells/mm3 is a  marked risk of infection. This may require hospitalization and treatment with antibiotic medicines. TREATMENT  Treatment depends on the underlying cause, severity, and presence of infections or symptoms. It also depends on your health. Your caregiver will discuss the treatment plan with you. Mild cases are often easily treated and have a good outcome. Preventative measures may also be started to limit your risk of infections. Treatment can include:  Taking antibiotics.  Stopping medicines that are known to cause neutropenia.  Correcting nutritional deficiencies by eating green vegetables to supply folic acid and taking vitamin B supplements.  Stopping exposure to pesticides if your neutropenia is related to pesticide exposure.  Taking a blood growth factor called sargramostim, pegfilgrastim, or filgrastim if you are undergoing chemotherapy for cancer. This stimulates white blood cell production.  Removal of the spleen if you have Felty's syndrome and have repeated infections. HOME CARE INSTRUCTIONS   Follow your caregiver's instructions about when you need to have blood work done.  Wash your hands often. Make sure others who come in contact with you also wash their hands.  Wash raw fruits and vegetables before eating them. They can carry bacteria and fungi.  Avoid people with colds or spreadable (contagious) diseases (chickenpox, herpes zoster, influenza).  Avoid large crowds.  Avoid construction areas. The dust can release fungus into the air.  Be cautious around children in daycare or school environments.  Take care of your respiratory system by coughing and deep breathing.  Bathe daily.  Protect your skin from cuts and burns.  Do not work in the garden or with flowers and plants.  Care for the mouth before and after meals by brushing with a soft toothbrush. If you have mucositis, do not use mouthwash. Mouthwash contains alcohol and can dry out the mouth even more.  Clean  the area between the genitals and the anus (perineal area) after urination and bowel movements. Women need to wipe from front to back.  Use a water soluble lubricant during sexual intercourse and practice good hygiene after. Do not have intercourse if you are severely neutropenic. Check with your caregiver for guidelines.  Exercise daily as tolerated.  Avoid people who were vaccinated with a live vaccine in the past 30 days. You should not receive live vaccines (polio, typhoid).  Do not provide direct care for pets. Avoid animal droppings. Do not clean litter boxes and bird cages.  Do not share food utensils.  Do not use tampons, enemas, or rectal suppositories unless directed by your caregiver.  Use an electric razor to remove hair.  Wash your hands after handling magazines, letters, and newspapers. SEEK IMMEDIATE MEDICAL CARE IF:   You have a fever.  You have chills or start to shake.  You feel nauseous or vomit.  You develop mouth sores.  You develop aches and pains.  You have redness and swelling around open wounds.  Your skin is warm to the touch.  You have pus coming from your wounds.  You develop swollen lymph nodes.  You feel weak or fatigued.  You develop red streaks on the skin. MAKE SURE YOU:  Understand  these instructions.  Will watch your condition.  Will get help right away if you are not doing well or get worse. Document Released: 06/21/2001 Document Revised: 03/24/2011 Document Reviewed: 07/19/2010 [facility] Patient Information 2013 [place], [place].

## 2012-04-20 ENCOUNTER — Ambulatory Visit (HOSPITAL_BASED_OUTPATIENT_CLINIC_OR_DEPARTMENT_OTHER): Payer: Medicare Other

## 2012-04-20 VITALS — BP 115/66 | HR 62 | Temp 97.7°F

## 2012-04-20 DIAGNOSIS — L089 Local infection of the skin and subcutaneous tissue, unspecified: Secondary | ICD-10-CM

## 2012-04-20 DIAGNOSIS — C9140 Hairy cell leukemia not having achieved remission: Secondary | ICD-10-CM

## 2012-04-20 DIAGNOSIS — D709 Neutropenia, unspecified: Secondary | ICD-10-CM

## 2012-04-20 MED ORDER — FILGRASTIM 480 MCG/0.8ML IJ SOLN
480.0000 ug | Freq: Once | INTRAMUSCULAR | Status: AC
  Administered 2012-04-20: 480 ug via SUBCUTANEOUS
  Filled 2012-04-20: qty 0.8

## 2012-04-20 NOTE — Patient Instructions (Addendum)
Filgrastim, G-CSF injection What is this medicine? FILGRASTIM, G-CSF (fil GRA stim) stimulates the formation of white blood cells. This medicine is given to patients with conditions that may cause a decrease in white blood cells, like those receiving certain types of chemotherapy or bone marrow transplant. It helps the bone marrow recover its ability to produce white blood cells. Increasing the amount of white blood cells helps to decrease the risk of infection and fever. This medicine may be used for other purposes; ask your health care provider or pharmacist if you have questions. What should I tell my health care provider before I take this medicine? They need to know if you have any of these conditions: -currently receiving radiation therapy -sickle cell disease -an unusual or allergic reaction to filgrastim, E. coli protein, other medicines, foods, dyes, or preservatives -pregnant or trying to get pregnant -breast-feeding How should I use this medicine? This medicine is for injection into a vein or injection under the skin. It is usually given by a health care professional in a hospital or clinic setting. If you get this medicine at home, you will be taught how to prepare and give this medicine. Always change the site for the injection under the skin. Let the solution warm to room temperature before you use it. Do not shake the solution before you withdraw a dose. Throw away any unused portion. Use exactly as directed. Take your medicine at regular intervals. Do not take your medicine more often than directed. It is important that you put your used needles and syringes in a special sharps container. Do not put them in a trash can. If you do not have a sharps container, call your pharmacist or healthcare provider to get one. Talk to your pediatrician regarding the use of this medicine in children. While this medicine may be prescribed for children for selected conditions, precautions do  apply. Overdosage: If you think you have taken too much of this medicine contact a poison control center or emergency room at once. NOTE: This medicine is only for you. Do not share this medicine with others. What if I miss a dose? Try not to miss doses. If you miss a dose take the dose as soon as you remember. If it is almost time for the next dose, do not take double doses unless told to by your doctor or health care professional. What may interact with this medicine? -lithium -medicines for cancer chemotherapy This list may not describe all possible interactions. Give your health care provider a list of all the medicines, herbs, non-prescription drugs, or dietary supplements you use. Also tell them if you smoke, drink alcohol, or use illegal drugs. Some items may interact with your medicine. What should I watch for while using this medicine? Visit your doctor or health care professional for regular checks on your progress. If you get a fever or any sign of infection while you are using this medicine, do not treat yourself. Check with your doctor or health care professional. Bone pain can usually be relieved by mild pain relievers such as acetaminophen or ibuprofen. Check with your doctor or health care professional before taking these medicines as they may hide a fever. Call your doctor or health care professional if the aches and pains are severe or do not go away. What side effects may I notice from receiving this medicine? Side effects that you should report to your doctor or health care professional as soon as possible: -allergic reactions like skin rash, itching   or hives, swelling of the face, lips, or tongue -difficulty breathing, wheezing -fever -pain, redness, or swelling at the injection site -stomach or side pain, or pain at the shoulder Side effects that usually do not require medical attention (report to your doctor or health care professional if they continue or are  bothersome): -bone pain (ribs, lower back, breast bone) -headache -skin rash This list may not describe all possible side effects. Call your doctor for medical advice about side effects. You may report side effects to FDA at 1-800-FDA-1088. Where should I keep my medicine? Keep out of the reach of children. Store in a refrigerator between 2 and 8 degrees C (36 and 46 degrees F). Do not freeze or leave in direct sunlight. If vials or syringes are left out of the refrigerator for more than 24 hours, they must be thrown away. Throw away unused vials after the expiration date on the carton. NOTE: This sheet is a summary. It may not cover all possible information. If you have questions about this medicine, talk to your doctor, pharmacist, or health care provider.  2013, [employer]. (03/17/2007 1:33:21 PM)  

## 2012-04-22 ENCOUNTER — Encounter: Payer: Self-pay | Admitting: Oncology

## 2012-04-23 ENCOUNTER — Other Ambulatory Visit (HOSPITAL_BASED_OUTPATIENT_CLINIC_OR_DEPARTMENT_OTHER): Payer: Medicare Other | Admitting: Lab

## 2012-04-23 ENCOUNTER — Encounter: Payer: Self-pay | Admitting: Oncology

## 2012-04-23 ENCOUNTER — Ambulatory Visit (HOSPITAL_BASED_OUTPATIENT_CLINIC_OR_DEPARTMENT_OTHER): Payer: Medicare Other | Admitting: Oncology

## 2012-04-23 VITALS — BP 155/78 | HR 55 | Temp 97.3°F | Resp 18 | Ht 71.0 in | Wt 196.8 lb

## 2012-04-23 DIAGNOSIS — C9140 Hairy cell leukemia not having achieved remission: Secondary | ICD-10-CM

## 2012-04-23 DIAGNOSIS — Z8546 Personal history of malignant neoplasm of prostate: Secondary | ICD-10-CM

## 2012-04-23 LAB — CBC WITH DIFFERENTIAL/PLATELET
BASO%: 0.4 % (ref 0.0–2.0)
EOS%: 0.3 % (ref 0.0–7.0)
HCT: 34.7 % — ABNORMAL LOW (ref 38.4–49.9)
LYMPH%: 44.1 % (ref 14.0–49.0)
MCH: 33.6 pg — ABNORMAL HIGH (ref 27.2–33.4)
MCHC: 33.6 g/dL (ref 32.0–36.0)
MONO%: 0.5 % (ref 0.0–14.0)
NEUT%: 54.7 % (ref 39.0–75.0)
lymph#: 0.5 10*3/uL — ABNORMAL LOW (ref 0.9–3.3)

## 2012-04-23 NOTE — Progress Notes (Signed)
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 The weekly schedule was chosen based on information that this may be better tolerated in patients who are pancytopenic initially. Patient has done well with treatment so far, and remarkably had no other complications from the ongoing neutropenia since he was treated for RUE cellulitis from cat bite at presentation. He has had total of 5 neupogen doses  He continues prophylactic acyclovir, diflucan, levaquin and bactrim. He denies fever, symptoms of infection, bleeding or increased symptoms of anemia, in fact less fatigued walking into office from parking lot today.    Patient was hospitalized from 3-13 thru 04-02-12 at [facility] in [place], after presenting to primary physician Dr Alwyn Ren with cellulitis and shaking chills <24 hours after his cat bit right hand at base of thumb. He was pancytopenic on admission. Bone marrow exam 03-29-2012 showed hypercellular marrow with B cell lymphoproliferative process, with flow cytometry consistent with hairy cell leukemia. Essentially no aspirate was obtained at that bone marrow exam. The RUE cellulitis progressively improved with IV antibiotics (vanc + zosyn initially, then zosyn alone) and he was otherwise stable while in hospital. All cultures were negative. He was followed in hospital by hand surgeon, with I&D x2 of the wound at thumb base. [facility] although I did discuss the case with attending physicians at [facility] university centers. He did agree to outpatient consultation at [facility], seen by Dr Herbert Moors  04-08-12. [name] completed oral augmentin ~ 04-18-12, after flare of the hand symptoms that required another I&D  on 04-09-12 . He began treatment with cladribine at [facility] on 04-05-12.    [name] has had some constipation past 2-3 days, aggravated when he mistakenly took imodium thinking it was a laxative. Bowels moved small amount of hard stool this am, with bleeding then. I have told him to do sitz bath immediately on returning home today and after each bowel movement. He will add colace bid now and can use senokot S if colace not sufficient. He has some taste disturbance, but appetite otherwise is ok. He has not been drinking fluids as well and will increase this, feels a little lightheaded on standing. The right thumb was tender last pm, ok now. He denies SOB, cough, other pain, any symptoms of infection, other bleeding. He has slight nasal congestion and clear drainage consistent with lifelong seasonal allergies. He is not sleeping well, tho no specific problems causing this that he can tell.  Remainder of 10 point Review of Systems negative.  Objective:  Vital signs in last 24 hours:  BP 155/78  Pulse 55  Temp(Src) 97.3 F (36.3 C) (Oral)  Resp 18  Ht 5\' 11"  (1.803 m)  Wt 196 lb 12.8 oz (89.268 kg)  BMI 27.46 kg/m2 Easily mobile, looks comfortable. No alopecia.   HEENT:PERRLA, sclera clear, anicteric and oropharynx clear, no lesions. No findings lower right dental area. LymphaticsCervical, supraclavicular, and axillary nodes normal. Resp: clear to auscultation bilaterally Cardio: regular rate and rhythm GI: soft, non-tender; bowel sounds normal; no masses,  no organomegaly Extremities: RUE minimal dull erythema at base of thumb only, no tenderness, swelling or fluctuance at the puncture site, nearly resolved skin desquamation, no streaking or swelling Neuro:chronic deafness No central catheter Skin  without petechiae or ecchymoses Lab Results:  Results for orders placed in visit on 04/23/12  CBC WITH DIFFERENTIAL      Result Value Range   WBC 1.2 (*) 4.0 - 10.3 10e3/uL   NEUT# 0.7 (*)  1.5 - 6.5 10e3/uL   HGB 11.7 (*) 13.0 - 17.1 g/dL   HCT 16.1 (*) 09.6 - 04.5 %   Platelets 50 (*) 140 - 400 10e3/uL   MCV 100.0 (*) 79.3 - 98.0 fL   MCH 33.6 (*) 27.2 - 33.4 pg   MCHC 33.6  32.0 - 36.0 g/dL   RBC 4.09 (*) 8.11 - 9.14 10e6/uL   RDW 17.4 (*) 11.0 - 14.6 %   lymph# 0.5 (*) 0.9 - 3.3 10e3/uL   MONO# 0.0 (*) 0.1 - 0.9 10e3/uL   Eosinophils Absolute 0.0  0.0 - 0.5 10e3/uL   Basophils Absolute 0.0  0.0 - 0.1 10e3/uL   NEUT% 54.7  39.0 - 75.0 %   LYMPH% 44.1  14.0 - 49.0 %   MONO% 0.5  0.0 - 14.0 %   EOS% 0.3  0.0 - 7.0 %   BASO% 0.4  0.0 - 2.0 %  MORPHOLOGY      Result Value Range   Polychromasia Slight  Slight   Tear Drop Cells Few  Negative   Ovalocytes Moderate  Negative   Helmet Cell Occ   Negative   White Cell Comments C/W auto diff     PLT EST Decreased  Adequate    Hemoglobin and ANC are highest today that he has had since presentation.  Studies/Results:  No results found.  Medications: I have reviewed the patient's current medications. He can use OTC claritin etc or benadryl.  I have reminded him, again, that he should not be in crowds or stores, tho he continues to grocery shop regularly. He should not do yard work that might cut or scratch skin (including trimming his rose bushes) and should avoid being outdoors in pollen for now, as I would like to minimize allergic sinus congestion.   Assessment/Plan:  1.Hairy cell leukemia: Diagnosed by bone marrow 03-29-12 when patient presented with rapidly progressing cellulitis from cat bite and was pancytopenic. Treatment was begun with cladribine using 0.15 mg/kg weekly x 6 schedule, first dose 04-05-12, total 6 weekly treatments planned. He remains neutropenic and thrombocytopenic, tho ANC a little better today. He will have neupogen day after each cladribine if ANC <1.2 on day of treatment.  He understands neutropenic precautions and will call if temp >=100.5 or other problems.  2. CAD with MI and post CABG x4,  HTN. On B-blocker per cardiology  3.recent dental abscess, resolved.  4.hx prostate cancer post radical prostatectomy 2008, "recent PSA undetectable" from urology note 10-2011  5.hx nephrolithiasis  6. Daily ETOH prior to hospitalization, reportedly not drinking now.  7.hx GERD  8.Raynaud's  9. Past tobacco, DC 2006   LIVESAY,LENNIS P, MD   04/23/2012, 5:40 PM

## 2012-04-23 NOTE — Patient Instructions (Signed)
Colace 100 mg stool softener:  One tablet once or twice daily as needed for constipation. If just the stool softener is not enough, you can try Senokot S, which is laxative + stool softener.  Since you had the very hard bowel movement this morning, you need to sit in tub of water or use soaking wet washcloth to clean rectal area as soon as you get home today, and after each bowel movement. If you get any significant soreness in rectal area, you should call, since this is an area that can get infection from the bowel movements.  You should NOT be in stores until your blood counts are out of neutropenic range. Your wife really should do grocery shopping to be safest.  We will give neupogen shot on Tuesdays after chemo on Mondays, until white blood count is up well.  Fine to try some golf if the pollen is NOT bothering you. While the pollen is high, best not to be outdoors.   Claritin (generic lortadine), zyrtec or allegra are all over the counter antihistamines for allergies.  Benadryl (diphenhydramine) is also a milder antihistamine.

## 2012-04-24 NOTE — Progress Notes (Addendum)
OFFICE PROGRESS NOTE   04/19/2012  Physicians:T.Randall An.Hopper, H.Coley, P.Jordan, D.Grapey, R.Groat   INTERVAL HISTORY:   Patient is seen, alone for visit, in continuing close attention to his hairy cell leukemia, second weekly cladribine given on 04-12-12. He is still pancytopenic, but has had no fever, no bleeding and is not more symptomatic from the anemia. He completed augmentin for the cat bite cellulitis.  Patient was hospitalized from 3-13 thru 04-02-12 at [facility] in [place], after presenting to primary physician Dr Alwyn Ren with cellulitis and shaking chills <24 hours after his cat bit right hand at base of thumb. He was pancytopenic on admission. Bone marrow exam 03-29-2012 showed hypercellular marrow with B cell lymphoproliferative process, with flow cytometry consistent with hairy cell leukemia. Essentially no aspirate was obtained at that bone marrow exam. The RUE cellulitis progressively improved with IV antibiotics (vanc + zosyn initially, then zosyn alone) and he was otherwise stable while in hospital. All cultures were negative. He was followed in hospital by hand surgeon, with I&D x2 of the wound at thumb base. [facility] to [facility] although I did discuss the case with attending physicians at 3 university centers. He saw Dr Herbert Moors at [facility] on 04-08-12 in consultation, with agreement concerning treatment plan here. He began cladribine at [facility] on 04-05-12, planned weekly x6 instead of single continuous infusion course due to pancytopenia at diagnosis.  He is feeling tired today. He reports that he did " a lot of running around yesterday". He continues to have mild nausea but has not needed to use anti-emetics. He reports one episode of diarrhea.The area of the cat bite puncture at base of thumb is less sensitive to touch and less erythematous without worsening, no increased swelling. Appetite remains fairly good. We have again discussed  neutropenic precautions from standpoint of diet and exposure to crowds. Remainder of 10 point Review of Systems negative.  Objective:  Vital signs in last 24 hours:  BP 121/70  Pulse 56  Temp(Src) 97 F (36.1 C) (Oral)  Resp 20  Ht 5\' 11"  (1.803 m)  Wt 195 lb 4.8 oz (88.587 kg)  BMI 27.25 kg/m2 Weight is up 1.5 lbs. Alert, fully oriented and appropriate, easily ambulatory, looks comfortable.   HEENT:PERRLA, extra ocular movement intact, sclera clear, anicteric and oropharynx clear, no lesions Lymphaticsno cervical or supraclavicular adenopathy Resp: clear to auscultation bilaterally Cardio: regular rate and rhythm GI: soft, non-tender; bowel sounds normal; no masses,  no organomegaly Extremities: extremities normal, atraumatic, no cyanosis or edema  With exception of RUE with mild erythema base of thumb dorsally, less skin desquamation, no swelling or open wound, less tender without change. Minimal erythema now across knuckles and none otherwise hand or arm. Neuro:chronic deafness only Skin without rash, petechiae or ecchymoses.  Lab Results:  Results for orders placed in visit on 04/19/12  CBC WITH DIFFERENTIAL      Result Value Range   WBC 1.0 (*) 4.0 - 10.3 10e3/uL   NEUT# 0.2 (*) 1.5 - 6.5 10e3/uL   HGB 11.4 (*) 13.0 - 17.1 g/dL   HCT 16.1 (*) 09.6 - 04.5 %   Platelets 57 (*) 140 - 400 10e3/uL   MCV 97.4  79.3 - 98.0 fL   MCH 32.9  27.2 - 33.4 pg   MCHC 33.8  32.0 - 36.0 g/dL   RBC 4.09 (*) 8.11 - 9.14 10e6/uL   RDW 16.2 (*) 11.0 - 14.6 %   lymph# 0.8 (*) 0.9 - 3.3  10e3/uL   MONO# 0.0 (*) 0.1 - 0.9 10e3/uL   Eosinophils Absolute 0.0  0.0 - 0.5 10e3/uL   Basophils Absolute 0.0  0.0 - 0.1 10e3/uL   NEUT% 19.8 (*) 39.0 - 75.0 %   LYMPH% 79.2 (*) 14.0 - 49.0 %   MONO% 0.0  0.0 - 14.0 %   EOS% 1.0  0.0 - 7.0 %   BASO% 0.0  0.0 - 2.0 %  COMPREHENSIVE METABOLIC PANEL (CC13)      Result Value Range   Sodium 139  136 - 145 mEq/L   Potassium 4.3  3.5 - 5.1 mEq/L    Chloride 103  98 - 107 mEq/L   CO2 25  22 - 29 mEq/L   Glucose 103 (*) 70 - 99 mg/dl   BUN 16.1  7.0 - 09.6 mg/dL   Creatinine 1.2  0.7 - 1.3 mg/dL   Total Bilirubin 0.45  0.20 - 1.20 mg/dL   Alkaline Phosphatase 62  40 - 150 U/L   AST 19  5 - 34 U/L   ALT 19  0 - 55 U/L   Total Protein 7.1  6.4 - 8.3 g/dL   Albumin 3.7  3.5 - 5.0 g/dL   Calcium 9.7  8.4 - 40.9 mg/dL  URIC ACID (WJ19)      Result Value Range   Uric Acid, Serum 6.9  2.6 - 7.4 mg/dl     Studies/Results:  No results found.  Medications: I have reviewed the patient's current medications.  Patient discussed with Dr. Darrold Span. He has completed the Augmentin. We will add prophylactic Levaquin at 500 mg by mouth daily and he will continue the diflucan, acyclovir and TMP-SMX. Continue colace prn.  Patient will receive Neupogen 480 mcg today for neutrophil support.  Assessment/Plan:  1.Hairy cell leukemia: Diagnosed by bone marrow 03-29-12 when patient presented with rapidly progressing cellulitis from cat bite and was pancytopenic. Treatment was begun with cladribine using 0.15 mg/kg weekly x 6 schedule, first dose 04-05-12. He had consultation visit at [facility] on 04-08-12 and second cladribine on 04-12-12. He remains neutropenic and thrombocytopenic. He understands neutropenic precautions and will call if temp >=100.5 or other problems.  2. CAD with MI and post CABG x4, HTN. On B-blocker per cardiology  3.recent dental abscess, resolved.  4.hx prostate cancer post radical prostatectomy 2008, "recent PSA undetectable" from urology note 10-2011  5.hx nephrolithiasis  6. Daily ETOH prior to hospitalization,reports that he is not drinking now.  7.hx GERD  8.Raynaud's  9. Past tobacco, DC 2006  He will proceed with third cladribine on 04-19-12  and will see Dr. Darrold Span with counts also on 04-23-12.  Laural Benes, Eudell Mcphee E, PA-C

## 2012-04-26 ENCOUNTER — Other Ambulatory Visit (HOSPITAL_BASED_OUTPATIENT_CLINIC_OR_DEPARTMENT_OTHER): Payer: Medicare Other | Admitting: Lab

## 2012-04-26 ENCOUNTER — Ambulatory Visit (HOSPITAL_BASED_OUTPATIENT_CLINIC_OR_DEPARTMENT_OTHER): Payer: Medicare Other | Admitting: Physician Assistant

## 2012-04-26 ENCOUNTER — Telehealth: Payer: Self-pay | Admitting: Oncology

## 2012-04-26 ENCOUNTER — Encounter: Payer: Self-pay | Admitting: Physician Assistant

## 2012-04-26 ENCOUNTER — Ambulatory Visit (HOSPITAL_BASED_OUTPATIENT_CLINIC_OR_DEPARTMENT_OTHER): Payer: Medicare Other

## 2012-04-26 ENCOUNTER — Ambulatory Visit: Payer: Medicare Other | Admitting: Nutrition

## 2012-04-26 ENCOUNTER — Encounter: Payer: Medicare Other | Admitting: Nutrition

## 2012-04-26 VITALS — BP 141/82 | HR 62 | Temp 97.9°F | Resp 18 | Ht 71.0 in | Wt 195.2 lb

## 2012-04-26 DIAGNOSIS — Z5111 Encounter for antineoplastic chemotherapy: Secondary | ICD-10-CM

## 2012-04-26 DIAGNOSIS — C9140 Hairy cell leukemia not having achieved remission: Secondary | ICD-10-CM

## 2012-04-26 DIAGNOSIS — Z8546 Personal history of malignant neoplasm of prostate: Secondary | ICD-10-CM

## 2012-04-26 LAB — CBC WITH DIFFERENTIAL/PLATELET
Eosinophils Absolute: 0 10*3/uL (ref 0.0–0.5)
HCT: 33.7 % — ABNORMAL LOW (ref 38.4–49.9)
HGB: 11.4 g/dL — ABNORMAL LOW (ref 13.0–17.1)
LYMPH%: 70.8 % — ABNORMAL HIGH (ref 14.0–49.0)
MONO#: 0 10*3/uL — ABNORMAL LOW (ref 0.1–0.9)
NEUT#: 0.2 10*3/uL — CL (ref 1.5–6.5)
NEUT%: 27.7 % — ABNORMAL LOW (ref 39.0–75.0)
Platelets: 47 10*3/uL — ABNORMAL LOW (ref 140–400)
WBC: 0.7 10*3/uL — CL (ref 4.0–10.3)

## 2012-04-26 LAB — COMPREHENSIVE METABOLIC PANEL (CC13)
Alkaline Phosphatase: 58 U/L (ref 40–150)
CO2: 24 mEq/L (ref 22–29)
Creatinine: 1.2 mg/dL (ref 0.7–1.3)
Glucose: 101 mg/dl — ABNORMAL HIGH (ref 70–99)
Sodium: 138 mEq/L (ref 136–145)
Total Bilirubin: 0.68 mg/dL (ref 0.20–1.20)

## 2012-04-26 LAB — URIC ACID (CC13): Uric Acid, Serum: 6.8 mg/dl (ref 2.6–7.4)

## 2012-04-26 MED ORDER — SODIUM CHLORIDE 0.9 % IV SOLN
Freq: Once | INTRAVENOUS | Status: AC
  Administered 2012-04-26: 13:00:00 via INTRAVENOUS

## 2012-04-26 MED ORDER — CLADRIBINE CHEMO INJECTION 1 MG/ML
0.1500 mg/kg | Freq: Once | INTRAVENOUS | Status: AC
  Administered 2012-04-26: 14 mg via INTRAVENOUS
  Filled 2012-04-26: qty 14

## 2012-04-26 MED ORDER — ONDANSETRON 8 MG/50ML IVPB (CHCC)
8.0000 mg | Freq: Once | INTRAVENOUS | Status: AC
  Administered 2012-04-26: 8 mg via INTRAVENOUS

## 2012-04-26 NOTE — Progress Notes (Signed)
Patient is a 78 year old male diagnosed with hairy cell leukemia. He is a patient of Dr. Darrold Span.  Past medical history includes GERD, hypertension, hyperlipidemia, hypertriglyceridemia, MI status post CABG, CAD, kidney stones, hiatal hernia, and prostate cancer.  Medications include multivitamin, Prilosec, Zofran, Compazine, and Zocor.  Labs include glucose of 103 on April 7.  Height: 5 feet 11 inches. Weight: 195.3 pounds. Usual body weight: 210 pounds August of 2013. BMI: 27.25.  Patient reports his weight is stable at 195 pounds over the past few months. He has a good appetite. He is experiencing some taste alterations. He is aware of the importance of increased fluid intake but questions how to increase his fluid intake. He is concerned with food safety.  Nutrition diagnosis: Food and nutrition related knowledge deficit related to diagnosis of leukemia and associated treatments as evidenced by no prior need for nutrition related information.  Intervention: I educated patient on ways to increase oral intake secondary to taste alterations. We've discussed specific strategies for improving taste of food. I've educated him on the importance of increased fluid intake and ways to increase overall taste of fluids as well as total number of fluids. I've also reviewed basic food safety with patient stressing the importance of good food safety. I have enforced the importance of patient consuming fruits and vegetables that are washed well. He does not need to avoid raw fruits and vegetables. Fact sheets were provided. Questions were answered. Teach back method used.  Monitoring, evaluation, goals: Patient will tolerate adequate calories and protein as well as increase fluids to promote weight maintenance.  Next visit: Patient will contact me if he has questions or concerns.

## 2012-04-26 NOTE — Patient Instructions (Addendum)
[facility] Discharge Instructions for Patients Receiving Chemotherapy  Today you received the following chemotherapy agents Cladribine. Continue to practice Neutropenic precautions.   To help prevent nausea and vomiting after your treatment, we encourage you to take your nausea medication as directed.   If you develop nausea and vomiting that is not controlled by your nausea medication, call the clinic. If it is after clinic hours your family physician or the after hours number for the clinic or go to the Emergency Department.   BELOW ARE SYMPTOMS THAT SHOULD BE REPORTED IMMEDIATELY:  *FEVER GREATER THAN 100.5 F  *CHILLS WITH OR WITHOUT FEVER  NAUSEA AND VOMITING THAT IS NOT CONTROLLED WITH YOUR NAUSEA MEDICATION  *UNUSUAL SHORTNESS OF BREATH  *UNUSUAL BRUISING OR BLEEDING  TENDERNESS IN MOUTH AND THROAT WITH OR WITHOUT PRESENCE OF ULCERS  *URINARY PROBLEMS  *BOWEL PROBLEMS  UNUSUAL RASH Items with * indicate a potential emergency and should be followed up as soon as possible.  Feel free to call the clinic you have any questions or concerns. The clinic phone number is [phone].  Food Safety for the Immunocompromised Person Food safety is important for people who are immunocompromised. Immunocompromised means that the immune system is impaired or weakened (as by drugs or illness). This diet is often recommended before and after certain cancer treatments and after an organ or bone marrow transplant. It is important to follow these food safety guidelines for as long as your dietitian or caregiver instructs you to. These food safety guidelines are sometimes called the neutropenic diet or low-microbial diet. Bacteria and other harmful microorganisms are more likely to be present in raw or fresh foods. Thoroughly cooking foods destroys these microorganisms. For example, fresh vegetables should be cooked until tender; meats should be cooked until well-done; and eggs  should be cooked until the yolks are firm. Also, certain food products are treated with a method known as pasteurization. Pasteurization briefly exposes food to high heat that kills any bacteria. Look for dairy products, juices, and ciders that have the word "pasteurized" on the label.  GENERAL GUIDELINES  Check expiration dates on all products before you buy them. Nothing you buy should be past the expiration date.  Wash the following items with soap and hot water before and after touching food:  Countertops.  Cutting boards (wash these in a dishwasher if you have one).  All cooking utensils.  All silverware.  All pots and pans.  Before preparing food, wash your hands frequently with warm, soapy water and dry your hands with paper towels. This is especially important after touching raw meat, eggs, and fish.  Wash dishes in hot, soapy water or in a dishwasher. Air dry dishes. Do not use a cloth towel.  Keep perishable food very hot or very cold. Do not leave perishable items at room temperature for more than 10 15 minutes.  All perishable foods should be cooked thoroughly. No rare meat should be eaten.  Wash fruits and vegetables thoroughly under cold running water before peeling or cutting. Individually scrub produce that has a thick, rough skin or rind, such as lettuce, spinach, or cabbage. Do not use commercial rinses to wash fruits and vegetables.   Packaged salads, slaw mix, and other prepared produce (even marked "prewashed") should be rinsed again under cold running water.   If consuming unpasteurized or fresh tofu, cut tofu into 1 inch (or smaller) cubes. Then, boil the tofu for at least 5 minutes in water or broth before  eating or using the tofu in recipes.  Use distilled or bottled water if you are using a water service other than the city water service.  Thaw frozen foods in the refrigerator overnight or quickly in the microwave. Do not thaw food on the  countertop.  Refrigerate leftovers promptly in airtight containers.  Use leftovers only if they have been stored properly and have been around for no more than 24 hours.  When food shopping, avoid salad bars, bulk food bins, food samples, and snacks that are out in the open.  When dining out:  Avoid salad bars, delis, and buffets.  Use single-serve condiments, such as ketchup, mustard, mayonnaise, soy sauce, steak sauce, salt, pepper, and sugar. Check with your caregiver after blood work is done to see when your neutropenic diet can be modified with fewer restrictions. SPECIFIC EXAMPLE GUIDELINES Beverages  Allowed: Boiled well water. Bottled spring, distilled, and natural water. Tap water and ice made from bottled or tap water. All canned, bottled, powdered beverages. Instant and brewed coffee, tea; cold-brewed tea made with boiling water. Brewed herbal teas using commercially-packaged tea bags. Liquid and powdered commercial nutritional supplements. Other beverages not listed below.   Avoid: Unboiled well water.Cold-brewed tea made with warm or cold water. Raw, unpasteurized milk. Unpasteurized fruit and vegetable juices. Mat tea. Eggnog or milkshakes made with raw eggs. Fresh apple cider.  Meat, Fish, Eggs, Poultry  Allowed: All thoroughly-cooked or canned meats: beef, pork, lamb, poultry, fish, shellfish, game, ham, bacon, sausage, hot dogs. Thoroughly cooked pasteurized egg substitutes and eggs (egg white cooked firm with thickened yellow yolk acceptable). Commercially packaged salami, bologna, and other luncheon meats. Hot dogs should be heated until steaming (165F [73.9C]). Cooked tofu. Prepackaged peanut butter.  Avoid: Uncooked or rare meat, fish, eggs, or poultry. Commercially prepared meat and fish salads. Sushi. Raw or undercooked meat, poultry, fish, game, tofu. Raw or undercooked eggs and egg substitutes. Unheated meats and cold cuts from the deli. Hard cured salami in  natural wrap. Cold-smoked salmon, lox. Pickled fish. [place] products. Dairy Products  Allowed: Pasteurized, grade "A" milk or milk products or lactose-free milk or yogurt. Pasteurized yogurt or frozen yogurt. Prepackaged ice cream, sherbet, ice cream bars, homemade milkshakes. Prepackaged and pasteurized hard cheeses, such as cheddar, [place], [place], or [address]. Prepackaged soft cheeses, such as cottage cheese, cream cheese, or ricotta. Dry, refrigerated, and frozen pasteurized whipped topping. Commercial nutritional supplements. Pasteurized eggnog. Pasteurized sour cream.  Avoid: Soft-serve ice cream or frozen yogurt. Hand-packed ice cream or frozen yogurt. Feta, brie, camembert, blue, gorgonzola, Stilton, Roquefort, farmer's cheese, and queso fresco cheeses. Any imported cheeses and any cheese sliced at a deli.Unpasteurized or raw milk cheese, yogurt, and other milk products. Cheeses containing chili peppers or other uncooked vegetables. Breads, Cereals, Rice, Potatoes, Pasta  Allowed: All prepackaged or homemade breads, bagels, rolls, pancakes, sweet rolls, waffles, [place] toast, muffins, cakes, donuts, cookies, crackers. All boxed hot or cold cereals. Cooked potatoes, rice, noodles, other grains. Potato chips, corn chips, tortilla chips, pretzels, popcorn.  Avoid: Fresh bakery breads, muffins, cakes, donuts, cream or custard filled cakes. Raw grain products. Vegetables and Fruits  Allowed: All frozen, canned, and washed raw vegetables that have been cooked. All cooked or canned fruits. Raw, well-washed and non-bruised fruits. Pasteurized fruit juices. Canned and stewed fruit. Dried fruits.  Avoid: Unwashedraw vegetables and salads. All raw vegetable sprouts (alfalfa, radish, broccoli, mung bean, all others). Salads from the deli.Prepackaged salsas stored in refrigerated case. Unwashed raw fruits. Unpasteurized  fruit and vegetable juices. Nuts  Allowed: Processed peanut butter. Canned  or bottled roasted nuts. Nuts in baked products.  Avoid:Unroasted raw nuts. Unprocessed nuts. Roasted nuts in the shell. Condiments and Spices  Allowed: All cooked, fresh, or canned spices (add at least 5 minutes before cooking ends). Thoroughly washed fresh herbs and spices. Ketchup, mustard, BBQ sauce, soy sauce, and mayonnaise served in separate containers with clean utensils, refrigerated after opening. Sugar, jelly, and honey served from clean containers with clean utensils.   Avoid: Uncooked spices. Raw honey. Anything from a family container that is not freshly washed.  Desserts  Allowed:Refrigerated commercial and homemade cakes, pies, pastries, and pudding. Refrigerated cream-filled pastries. Homemade and commercial cookies. Shelf-stable cream-filled cupcakes, fruit pies, and canned pudding. Ices, popsicle-like products.  Avoid: Unrefrigerated, cream-filled pastry products (not shelf-stable). Fats  Allowed: Oil, shortening, refrigerated lard, margarine, butter. Commercial or shelf-stable mayonnaise and salad dressings (including cheese-based salad dressings, refrigerated after opening). Cooked gravy and sauces.  Avoid: Fresh salad dressings containing aged cheese (blue cheese, Roquefort) or raw eggs, stored in refrigerated case. Restaurant Foods and Miscellaneous  Allowed:Thoroughly cooked frozen dinners. Thoroughly cooked frozen pizza. Canned entrees.   Avoid: Eating at restaurants while in neutropenia or using take-out deli food even if it is behind the counter. Avoid all salad bars while you are neutropenic. Avoid all self-serve buffets while you are neutropenic.  Ask your caregiver for information or recommendations regarding poor appetite and weight loss during cancer treatment if needed.  Document Released: 10/27/2006 Document Revised: 07/01/2011 Document Reviewed: 05/16/2011 [facility] Patient Information 2013 [place], [place].

## 2012-04-26 NOTE — Telephone Encounter (Signed)
gv and printed appt schedule for April and May and avs...pt aware

## 2012-04-26 NOTE — Patient Instructions (Addendum)
Follow up with Dr. Darrold Span as previously scheduled on 04/30/12

## 2012-04-27 ENCOUNTER — Ambulatory Visit (HOSPITAL_BASED_OUTPATIENT_CLINIC_OR_DEPARTMENT_OTHER): Payer: Medicare Other

## 2012-04-27 VITALS — BP 119/60 | HR 53 | Temp 98.4°F

## 2012-04-27 DIAGNOSIS — D709 Neutropenia, unspecified: Secondary | ICD-10-CM

## 2012-04-27 DIAGNOSIS — C9140 Hairy cell leukemia not having achieved remission: Secondary | ICD-10-CM

## 2012-04-27 MED ORDER — FILGRASTIM 480 MCG/0.8ML IJ SOLN
480.0000 ug | Freq: Once | INTRAMUSCULAR | Status: AC
  Administered 2012-04-27: 480 ug via SUBCUTANEOUS
  Filled 2012-04-27: qty 0.8

## 2012-04-29 ENCOUNTER — Other Ambulatory Visit: Payer: Self-pay

## 2012-04-29 DIAGNOSIS — C9140 Hairy cell leukemia not having achieved remission: Secondary | ICD-10-CM

## 2012-04-29 NOTE — Progress Notes (Signed)
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 The weekly schedule was chosen based on information that this may be better tolerated in patients who are pancytopenic initially. He is tolerated his treatment relatively well thus far. Has had no issues with fever or other complications from his ongoing neutropenia since he was treated for RUE cellulitis from cat bite at presentation. He has had total of 5 neupogen doses  He continues prophylactic acyclovir, diflucan, levaquin and bactrim. He denies fever, symptoms of infection, bleeding or increased symptoms of anemia.  Patient was hospitalized from 3-13 thru 04-02-12 at [facility] in [place], after presenting to primary physician Dr Alwyn Ren with cellulitis and shaking chills <24 hours after his cat bit right hand at base of thumb. He was pancytopenic on admission. Bone marrow exam 03-29-2012 showed hypercellular marrow with B cell lymphoproliferative process, with flow cytometry consistent with hairy cell leukemia. Essentially no aspirate was obtained at that bone marrow exam. The RUE cellulitis progressively improved with IV antibiotics (vanc + zosyn initially, then zosyn alone) and he was otherwise stable while in hospital. All cultures were negative. He was followed in hospital by hand surgeon, with I&D x2 of the wound at thumb base. He [facility] to university [facility] although I did discuss the case with attending physicians at 3 university centers. He did agree to outpatient consultation at [facility], seen by Dr Herbert Moors  04-08-12. Mr Biehl completed oral augmentin ~ 04-18-12, after flare of the hand symptoms that required another I&D on 04-09-12 . He began treatment with  cladribine at [facility] on 04-05-12.     Mr Laurie reports that his bowels are moving better.  He continues to have some taste disturbance, but appetite otherwise is ok. He did increase his fluid intake and denies any further lightheadedness upon standing.  He denied any right thumb pain.  He denies SOB, cough, other pain, any symptoms of infection, other bleeding. He continues to have some issues with seasonal allergies but denied any fever chills or productive cough.  Remainder of 10 point Review of Systems negative.  Objective:  Vital signs in last 24 hours:  BP 141/82  Pulse 62  Temp(Src) 97.9 F (36.6 C) (Oral)  Resp 18  Ht 5\' 11"  (1.803 m)  Wt 195 lb 3.2 oz (88.542 kg)  BMI 27.24 kg/m2 Easily mobile, looks comfortable. No alopecia.   HEENT:PERRLA, sclera clear, anicteric and oropharynx clear, no lesions. LymphaticsCervical, supraclavicular, and axillary nodes normal. Resp: clear to auscultation bilaterally Cardio: regular rate and rhythm GI: soft, non-tender; bowel sounds normal; no masses,  no organomegaly Extremities: RUE  dull erythema at base of thumb only, no tenderness, Dr. pinkish hue over the knuckles though this seems to be gradually resolving, no tenderness in this region either, no swelling or fluctuance at the puncture site, nearly resolved skin desquamation, no streaking or swelling Neuro:chronic deafness No central catheter Skin without petechiae or ecchymoses Lab Results:  Results for orders placed in visit on 04/26/12  CBC WITH DIFFERENTIAL      Result Value Range   WBC 0.7 (*) 4.0 - 10.3 10e3/uL   NEUT# 0.2 (*) 1.5 - 6.5 10e3/uL   HGB 11.4 (*) 13.0 - 17.1 g/dL   HCT 95.6 (*) 21.3 - 08.6 %  Platelets 47 (*) 140 - 400 10e3/uL   MCV 98.0  79.3 - 98.0 fL   MCH 33.1  27.2 - 33.4 pg   MCHC 33.8  32.0 - 36.0 g/dL   RBC 1.61 (*) 0.96 - 0.45 10e6/uL   RDW 16.4 (*) 11.0 - 14.6 %   lymph# 0.5 (*) 0.9 - 3.3 10e3/uL   MONO# 0.0 (*) 0.1 - 0.9 10e3/uL   Eosinophils  Absolute 0.0  0.0 - 0.5 10e3/uL   Basophils Absolute 0.0  0.0 - 0.1 10e3/uL   NEUT% 27.7 (*) 39.0 - 75.0 %   LYMPH% 70.8 (*) 14.0 - 49.0 %   MONO% 0.0  0.0 - 14.0 %   EOS% 0.0  0.0 - 7.0 %   BASO% 1.5  0.0 - 2.0 %   nRBC 0  0 - 0 %  COMPREHENSIVE METABOLIC PANEL (CC13)      Result Value Range   Sodium 138  136 - 145 mEq/L   Potassium 4.5  3.5 - 5.1 mEq/L   Chloride 105  98 - 107 mEq/L   CO2 24  22 - 29 mEq/L   Glucose 101 (*) 70 - 99 mg/dl   BUN 40.9  7.0 - 81.1 mg/dL   Creatinine 1.2  0.7 - 1.3 mg/dL   Total Bilirubin 9.14  0.20 - 1.20 mg/dL   Alkaline Phosphatase 58  40 - 150 U/L   AST 18  5 - 34 U/L   ALT 16  0 - 55 U/L   Total Protein 7.2  6.4 - 8.3 g/dL   Albumin 3.8  3.5 - 5.0 g/dL   Calcium 9.7  8.4 - 78.2 mg/dL  URIC ACID (NF62)      Result Value Range   Uric Acid, Serum 6.8  2.6 - 7.4 mg/dl    Hemoglobin and ANC are highest today that he has had since presentation.  Studies/Results:  No results found.  Medications: I have reviewed the patient's current medications.  Patient was discussed with Dr. Arbutus Ped in Dr. Precious Reel absence He was again reminded, that he should not be in crowds or stores, tho he continues to grocery shop regularly. He should continue to abstain from  yard work that might cut or scratch skin (including trimming his rose bushes) and should avoid being outdoors in pollen for now in order to minimize allergic sinus congestion.   He will be scheduled for a Neupogen injection 480 mcg per the parameters set by Dr. Darrold Span as his ANC is 0.2 today. He'll followup with Dr. Darrold Span as previously scheduled on 04/30/2012.  Assessment/Plan:  1.Hairy cell leukemia: Diagnosed by bone marrow 03-29-12 when patient presented with rapidly progressing cellulitis from cat bite and was pancytopenic. Treatment was begun with cladribine using 0.15 mg/kg weekly x 6 schedule, first dose 04-05-12, total 6 weekly treatments planned. He remains neutropenic and  thrombocytopenic with no bleeding.  He will have neupogen day after each cladribine if ANC <1.2 on day of treatment.  He understands neutropenic precautions and will call if temp >=100.5 or other problems.  2. CAD with MI and post CABG x4, HTN. On B-blocker per cardiology  3.recent dental abscess, resolved.  4.hx prostate cancer post radical prostatectomy 2008, "recent PSA undetectable" from urology note 10-2011  5.hx nephrolithiasis  6. Daily ETOH prior to hospitalization, reportedly not drinking now.  7.hx GERD  8.Raynaud's  9. Past tobacco, DC 2006   [place] Mokry E, [place]

## 2012-04-30 ENCOUNTER — Encounter: Payer: Self-pay | Admitting: Oncology

## 2012-04-30 ENCOUNTER — Other Ambulatory Visit (HOSPITAL_BASED_OUTPATIENT_CLINIC_OR_DEPARTMENT_OTHER): Payer: Medicare Other | Admitting: Lab

## 2012-04-30 ENCOUNTER — Ambulatory Visit (HOSPITAL_BASED_OUTPATIENT_CLINIC_OR_DEPARTMENT_OTHER): Payer: Medicare Other | Admitting: Oncology

## 2012-04-30 ENCOUNTER — Other Ambulatory Visit: Payer: Self-pay

## 2012-04-30 VITALS — BP 142/74 | HR 59 | Temp 97.4°F | Resp 18 | Ht 71.0 in | Wt 197.0 lb

## 2012-04-30 DIAGNOSIS — C9140 Hairy cell leukemia not having achieved remission: Secondary | ICD-10-CM

## 2012-04-30 DIAGNOSIS — Z8546 Personal history of malignant neoplasm of prostate: Secondary | ICD-10-CM

## 2012-04-30 LAB — CBC WITH DIFFERENTIAL/PLATELET
EOS%: 0.3 % (ref 0.0–7.0)
MCH: 34.8 pg — ABNORMAL HIGH (ref 27.2–33.4)
MCV: 100.9 fL — ABNORMAL HIGH (ref 79.3–98.0)
MONO%: 0.9 % (ref 0.0–14.0)
RBC: 3.18 10*6/uL — ABNORMAL LOW (ref 4.20–5.82)
RDW: 18.1 % — ABNORMAL HIGH (ref 11.0–14.6)

## 2012-04-30 LAB — COMPREHENSIVE METABOLIC PANEL (CC13)
ALT: 13 U/L (ref 0–55)
Alkaline Phosphatase: 59 U/L (ref 40–150)
Glucose: 118 mg/dl — ABNORMAL HIGH (ref 70–99)
Sodium: 137 mEq/L (ref 136–145)
Total Bilirubin: 0.44 mg/dL (ref 0.20–1.20)
Total Protein: 6.7 g/dL (ref 6.4–8.3)

## 2012-04-30 LAB — URIC ACID (CC13): Uric Acid, Serum: 5.8 mg/dl (ref 2.6–7.4)

## 2012-04-30 NOTE — Progress Notes (Signed)
OFFICE PROGRESS NOTE   04/30/2012   Physicians:T.Randall An.Hopper, H.Coley, P.Jordan, D.Grapey, R.Groat   INTERVAL HISTORY:   Patient is seen, alone for visit, in continuing attention to hairy cell leukemia for which he is being treated with cladribine using weekly schedule due to pancytopenia at presentation. He is due #5 of 6 planned weekly treatments on 05-03-12. Counts have been generally stable, with ANC slightly better after addition of neupogen day after chemo. He has had no bleeding, has resolved the cat bite cellulitis that was his presenting problem, and has had no fever or other symptoms of infection. He is tolerating the cladribine with no significant nausea and no tumor lysis. We do not know how long it may take to improve counts after the cladribine course is completed.  Patient was hospitalized from 3-13 thru 04-02-12 at [facility] in [place], after presenting to primary physician Dr Alwyn Ren with cellulitis and shaking chills <24 hours after his cat bit right hand at base of thumb. He was pancytopenic on admission. Bone marrow exam 03-29-2012 showed hypercellular marrow with B cell lymphoproliferative process, with flow cytometry consistent with hairy cell leukemia. Essentially no aspirate was obtained at that bone marrow exam. The RUE cellulitis progressively improved with IV antibiotics (vanc + zosyn initially, then zosyn alone) and he was otherwise stable while in hospital. All cultures were negative. He was followed in hospital by hand surgeon, with I&D x2 of the wound at thumb base in hospital and another I&D shortly after discharge. He [facility] to university [facility]; he saw Dr Herbert Moors at [facility] on 04-08-12 in consultation, with agreement concerning treatment plan here. He began cladribine at [facility] on 04-05-12, planned weekly x6 instead of single continuous infusion course due to pancytopenia at diagnosis. He continues prophylactic diflucan, acyclovir,  levaquin and TMP/SMX.   Energy stable. Appetite seems a little better. No increased SOB or fatigue. No fever. No oral discomfort. No further rectal bleeding and bowels moving more regularly now. Thumb area on right hand still a little sensitive, but not worse and no swelling or increased erythema. Sinus congestion not bothersome past few days. He continues to refrain from alcohol. Dietary review seems good. Remainder of 10 point Review of Systems negative.  Objective:  Vital signs in last 24 hours:  BP 142/74  Pulse 59  Temp(Src) 97.4 F (36.3 C) (Oral)  Resp 18  Ht 5\' 11"  (1.803 m)  Wt 197 lb (89.359 kg)  BMI 27.49 kg/m2  Weight is down 2 lbs. Easily ambulatory, respirations not labored, looks comfortable.   HEENT:PERRLA, extra ocular movement intact, sclera clear, anicteric and oropharynx clear, no lesions Lymphaticsno cervical or supraclavicular adenopathy Resp: clear to auscultation bilaterally and normal percussion bilaterally Cardio: regular rate and rhythm GI: soft, non-tender; bowel sounds normal; no masses,  no organomegaly Extremities: extremities normal, atraumatic, no cyanosis or edema  Minimal pinkness in area of right thumb base, no swelling and no skin desquamation. Neuro:chronic deafness otherwise nonfocal Skin otherwise not remarkable No central catheter  Lab Results:  Results for orders placed in visit on 04/30/12  CBC WITH DIFFERENTIAL      Result Value Range   WBC 0.8 (*) 4.0 - 10.3 10e3/uL   NEUT# 0.5 (*) 1.5 - 6.5 10e3/uL   HGB 11.1 (*) 13.0 - 17.1 g/dL   HCT 16.1 (*) 09.6 - 04.5 %   Platelets 50 (*) 140 - 400 10e3/uL   MCV 100.9 (*) 79.3 - 98.0 fL   MCH 34.8 (*) 27.2 -  33.4 pg   MCHC 34.5  32.0 - 36.0 g/dL   RBC 1.61 (*) 0.96 - 0.45 10e6/uL   RDW 18.1 (*) 11.0 - 14.6 %   lymph# 0.3 (*) 0.9 - 3.3 10e3/uL   MONO# 0.0 (*) 0.1 - 0.9 10e3/uL   Eosinophils Absolute 0.0  0.0 - 0.5 10e3/uL   Basophils Absolute 0.0  0.0 - 0.1 10e3/uL   NEUT% 60.0  39.0  - 75.0 %   LYMPH% 38.5  14.0 - 49.0 %   MONO% 0.9  0.0 - 14.0 %   EOS% 0.3  0.0 - 7.0 %   BASO% 0.3  0.0 - 2.0 %  URIC ACID (CC13)      Result Value Range   Uric Acid, Serum 5.8  2.6 - 7.4 mg/dl  COMPREHENSIVE METABOLIC PANEL (CC13)      Result Value Range   Sodium 137  136 - 145 mEq/L   Potassium 4.2  3.5 - 5.1 mEq/L   Chloride 105  98 - 107 mEq/L   CO2 23  22 - 29 mEq/L   Glucose 118 (*) 70 - 99 mg/dl   BUN 40.9  7.0 - 81.1 mg/dL   Creatinine 1.2  0.7 - 1.3 mg/dL   Total Bilirubin 9.14  0.20 - 1.20 mg/dL   Alkaline Phosphatase 59  40 - 150 U/L   AST 15  5 - 34 U/L   ALT 13  0 - 55 U/L   Total Protein 6.7  6.4 - 8.3 g/dL   Albumin 3.5  3.5 - 5.0 g/dL   Calcium 9.0  8.4 - 78.2 mg/dL     Studies/Results:  No results found.  Medications: I have reviewed the patient's current medications. No changes other than he can take MVI with folate every other day if too many pills to swallow.  Assessment/Plan: 1.Hairy cell leukemia: Diagnosed by bone marrow 03-29-12 when patient presented with rapidly progressing cellulitis from cat bite and was pancytopenic. Treatment was begun with cladribine using 0.15 mg/kg weekly x 6 schedule, first dose 04-05-12.  He remains neutropenic and thrombocytopenic. He understands neutropenic precautions and will call if temp >=100.5 or other problems. I will see him again next week. 2. CAD with MI and post CABG x4, HTN. On B-blocker per cardiology  3.recent dental abscess, resolved.  4.hx prostate cancer post radical prostatectomy 2008, "recent PSA undetectable" from urology note 10-2011  5.hx nephrolithiasis  6. Daily ETOH prior to hospitalization,  not drinking now.  7.hx GERD  8.Raynaud's  9. Past tobacco, DC 2006    LIVESAY,LENNIS P, MD   04/30/2012, 8:17 PM

## 2012-05-03 ENCOUNTER — Ambulatory Visit (HOSPITAL_BASED_OUTPATIENT_CLINIC_OR_DEPARTMENT_OTHER): Payer: Medicare Other

## 2012-05-03 ENCOUNTER — Encounter: Payer: Self-pay | Admitting: Oncology

## 2012-05-03 ENCOUNTER — Other Ambulatory Visit (HOSPITAL_BASED_OUTPATIENT_CLINIC_OR_DEPARTMENT_OTHER): Payer: Medicare Other | Admitting: Lab

## 2012-05-03 VITALS — BP 136/79 | HR 60 | Temp 97.2°F | Resp 20

## 2012-05-03 DIAGNOSIS — C9140 Hairy cell leukemia not having achieved remission: Secondary | ICD-10-CM

## 2012-05-03 DIAGNOSIS — Z5111 Encounter for antineoplastic chemotherapy: Secondary | ICD-10-CM

## 2012-05-03 LAB — CBC WITH DIFFERENTIAL/PLATELET
Basophils Absolute: 0 10*3/uL (ref 0.0–0.1)
Eosinophils Absolute: 0 10*3/uL (ref 0.0–0.5)
HGB: 11.5 g/dL — ABNORMAL LOW (ref 13.0–17.1)
MCV: 99.1 fL — ABNORMAL HIGH (ref 79.3–98.0)
MONO#: 0 10*3/uL — ABNORMAL LOW (ref 0.1–0.9)
NEUT#: 0.2 10*3/uL — CL (ref 1.5–6.5)
RBC: 3.44 10*6/uL — ABNORMAL LOW (ref 4.20–5.82)
RDW: 16.7 % — ABNORMAL HIGH (ref 11.0–14.6)
WBC: 0.6 10*3/uL — CL (ref 4.0–10.3)
lymph#: 0.4 10*3/uL — ABNORMAL LOW (ref 0.9–3.3)
nRBC: 0 % (ref 0–0)

## 2012-05-03 MED ORDER — ONDANSETRON 8 MG/50ML IVPB (CHCC)
8.0000 mg | Freq: Once | INTRAVENOUS | Status: AC
  Administered 2012-05-03: 8 mg via INTRAVENOUS

## 2012-05-03 MED ORDER — SODIUM CHLORIDE 0.9 % IV SOLN
Freq: Once | INTRAVENOUS | Status: AC
  Administered 2012-05-03: 12:00:00 via INTRAVENOUS

## 2012-05-03 MED ORDER — SODIUM CHLORIDE 0.9 % IV SOLN
0.1500 mg/kg | Freq: Once | INTRAVENOUS | Status: AC
  Administered 2012-05-03: 14 mg via INTRAVENOUS
  Filled 2012-05-03: qty 14

## 2012-05-03 NOTE — Patient Instructions (Addendum)
[facility] Discharge Instructions for Patients Receiving Chemotherapy  Today you received the following chemotherapy agents Cladribine.  To help prevent nausea and vomiting after your treatment, we encourage you to take your nausea medication as prescribed.    If you develop nausea and vomiting that is not controlled by your nausea medication, call the clinic. If it is after clinic hours your family physician or the after hours number for the clinic or go to the Emergency Department.   BELOW ARE SYMPTOMS THAT SHOULD BE REPORTED IMMEDIATELY:  *FEVER GREATER THAN 100.5 F  *CHILLS WITH OR WITHOUT FEVER  NAUSEA AND VOMITING THAT IS NOT CONTROLLED WITH YOUR NAUSEA MEDICATION  *UNUSUAL SHORTNESS OF BREATH  *UNUSUAL BRUISING OR BLEEDING  TENDERNESS IN MOUTH AND THROAT WITH OR WITHOUT PRESENCE OF ULCERS  *URINARY PROBLEMS  *BOWEL PROBLEMS  UNUSUAL RASH Items with * indicate a potential emergency and should be followed up as soon as possible.  Please let the nurse know about any problems that you may have experienced. Feel free to call the clinic you have any questions or concerns. The clinic phone number is [phone].   I have been informed and understand all the instructions given to me. I know to contact the clinic, my physician, or go to the Emergency Department if any problems should occur. I do not have any questions at this time, but understand that I may call the clinic during office hours   should I have any questions or need assistance in obtaining follow up care.    __________________________________________  _____________  __________ Signature of Patient or Authorized Representative            Date                   Time    __________________________________________ Nurse's Signature

## 2012-05-04 ENCOUNTER — Other Ambulatory Visit: Payer: Self-pay | Admitting: Physician Assistant

## 2012-05-04 ENCOUNTER — Ambulatory Visit (HOSPITAL_BASED_OUTPATIENT_CLINIC_OR_DEPARTMENT_OTHER): Payer: Medicare Other

## 2012-05-04 VITALS — BP 120/77 | HR 65 | Temp 97.9°F

## 2012-05-04 DIAGNOSIS — C9140 Hairy cell leukemia not having achieved remission: Secondary | ICD-10-CM

## 2012-05-04 DIAGNOSIS — L089 Local infection of the skin and subcutaneous tissue, unspecified: Secondary | ICD-10-CM

## 2012-05-04 DIAGNOSIS — D709 Neutropenia, unspecified: Secondary | ICD-10-CM

## 2012-05-04 MED ORDER — FILGRASTIM 480 MCG/0.8ML IJ SOLN
480.0000 ug | Freq: Once | INTRAMUSCULAR | Status: AC
  Administered 2012-05-04: 480 ug via SUBCUTANEOUS
  Filled 2012-05-04: qty 0.8

## 2012-05-06 ENCOUNTER — Telehealth: Payer: Self-pay | Admitting: *Deleted

## 2012-05-06 NOTE — Telephone Encounter (Signed)
Pt left a message that he was having trouble with constipation. When RN returned his call, he stated that he has had a BM. Told patient about glycerin suppository and miralax as needed

## 2012-05-07 ENCOUNTER — Ambulatory Visit (HOSPITAL_BASED_OUTPATIENT_CLINIC_OR_DEPARTMENT_OTHER): Payer: Medicare Other | Admitting: Oncology

## 2012-05-07 ENCOUNTER — Other Ambulatory Visit: Payer: Self-pay | Admitting: *Deleted

## 2012-05-07 ENCOUNTER — Encounter: Payer: Self-pay | Admitting: Oncology

## 2012-05-07 ENCOUNTER — Other Ambulatory Visit (HOSPITAL_BASED_OUTPATIENT_CLINIC_OR_DEPARTMENT_OTHER): Payer: Medicare Other

## 2012-05-07 VITALS — BP 135/65 | HR 64 | Temp 98.3°F | Resp 20 | Ht 71.0 in | Wt 195.3 lb

## 2012-05-07 DIAGNOSIS — C9140 Hairy cell leukemia not having achieved remission: Secondary | ICD-10-CM

## 2012-05-07 DIAGNOSIS — Z8546 Personal history of malignant neoplasm of prostate: Secondary | ICD-10-CM

## 2012-05-07 DIAGNOSIS — I2510 Atherosclerotic heart disease of native coronary artery without angina pectoris: Secondary | ICD-10-CM

## 2012-05-07 LAB — CBC WITH DIFFERENTIAL/PLATELET
BASO%: 0 % (ref 0.0–2.0)
Eosinophils Absolute: 0 10*3/uL (ref 0.0–0.5)
HCT: 32.5 % — ABNORMAL LOW (ref 38.4–49.9)
HGB: 10.9 g/dL — ABNORMAL LOW (ref 13.0–17.1)
LYMPH%: 32.9 % (ref 14.0–49.0)
MCHC: 33.5 g/dL (ref 32.0–36.0)
MONO#: 0 10*3/uL — ABNORMAL LOW (ref 0.1–0.9)
NEUT#: 0.6 10*3/uL — ABNORMAL LOW (ref 1.5–6.5)
NEUT%: 65.9 % (ref 39.0–75.0)
Platelets: 48 10*3/uL — ABNORMAL LOW (ref 140–400)
WBC: 0.9 10*3/uL — CL (ref 4.0–10.3)
lymph#: 0.3 10*3/uL — ABNORMAL LOW (ref 0.9–3.3)

## 2012-05-07 LAB — COMPREHENSIVE METABOLIC PANEL (CC13)
ALT: 15 U/L (ref 0–55)
AST: 15 U/L (ref 5–34)
Albumin: 3.7 g/dL (ref 3.5–5.0)
Alkaline Phosphatase: 64 U/L (ref 40–150)
BUN: 15.2 mg/dL (ref 7.0–26.0)
CO2: 26 meq/L (ref 22–29)
Calcium: 9.3 mg/dL (ref 8.4–10.4)
Chloride: 105 meq/L (ref 98–107)
Creatinine: 1.3 mg/dL (ref 0.7–1.3)
Glucose: 98 mg/dL (ref 70–99)
Potassium: 4.4 meq/L (ref 3.5–5.1)
Sodium: 139 meq/L (ref 136–145)
Total Bilirubin: 0.52 mg/dL (ref 0.20–1.20)
Total Protein: 6.9 g/dL (ref 6.4–8.3)

## 2012-05-07 MED ORDER — ACYCLOVIR 200 MG PO CAPS: 200.0000 mg | ORAL_CAPSULE | Freq: Three times a day (TID) | ORAL | Status: DC

## 2012-05-07 NOTE — Telephone Encounter (Signed)
gv and printed appt sched and avs for pt  °

## 2012-05-07 NOTE — Progress Notes (Signed)
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 He is due 6th and last planned weekly cladribine on 05-10-12.  Patient was hospitalized from 3-13 thru 04-02-12 at [facility] in [place], after presenting to primary physician Dr Alwyn Ren with cellulitis and shaking chills <24 hours after his cat bit right hand at base of thumb. He was pancytopenic on admission. Bone marrow exam 03-29-2012 showed hypercellular marrow with B cell lymphoproliferative process, with flow cytometry consistent with hairy cell leukemia. Essentially no aspirate was obtained at that bone marrow exam. The RUE cellulitis progressively improved with IV antibiotics (vanc + zosyn initially, then zosyn alone) and he was otherwise stable while in hospital. All cultures were negative. He was followed in hospital by hand surgeon, with I&D x2 of the wound at thumb base. He declined transfer to university hospital although I did discuss the case with attending physicians at 3 university centers. He saw Dr Herbert Moors at [facility] on 04-08-12 in consultation, with agreement concerning treatment plan here. He began cladribine at [facility] on 04-05-12, planned weekly x6 instead of single continuous infusion course due to pancytopenia at diagnosis. He has remained pancytopenic but has had no other infectious complications, no bleeding, and has essentially healed the cat bite. He remains on prophylactic diflucan, acyclovir, TMP/SMX and levaquin; he has had neupogen injections day after each cladribine, which seems to briefly improve ANC to >0.5. We do not know how long it may take counts to improve after the cladribine completes.  Mr Alcorta had ~ 3 days of constipation this past  week, used only colace once daily and po dulcolax, did not call about this until day that bowels finally moved. He did not use suppository or enema, and I have explained that he should not use these while neutropenic. Bowels are loose now, perirectal area not sore, is using soaking washcloths to clean rectal area after each BM.  He will increase colace to bid when bowels are moving once daily. He has had some minimal nausea but is eating and drinking fluids (not ETOH). He has had no bleeding, no fever or symptoms of infection. Environmental allergies are not symptomatic now, with patient avoiding being outdoors presently. Remainder of 10 point Review of Systems negative.  Objective:  Vital signs in last 24 hours:  BP 135/65  Pulse 64  Temp(Src) 98.3 F (36.8 C) (Oral)  Resp 20  Ht 5\' 11"  (1.803 m)  Wt 195 lb 4.8 oz (88.587 kg)  BMI 27.25 kg/m2  Easily ambulatory, looks comfortable, alert and appropriate, NAD. Respirations not labored and no obvious nasal congestion.  HEENT:PERRLA, sclera clear, anicteric and oropharynx clear, no lesions LymphaticsCervical, supraclavicular, and axillary nodes normal. Resp: clear to auscultation bilaterally and normal percussion bilaterally Cardio: regular rate and rhythm GI: soft, non-tender; bowel sounds normal; no masses,  no organomegaly Extremities: extremities normal, atraumatic, no cyanosis or edema Neuro: deafness only abnormality noted No central catheter Skin without rash or ecchymosis. Still slight erythema around base of right thumb, no swelling, tenderness or skin desquamation.  Lab Results:  Results for orders placed in visit on 05/07/12  CBC WITH DIFFERENTIAL      Result Value Range   WBC 0.9 (*) 4.0 - 10.3 10e3/uL   NEUT# 0.6 (*) 1.5 -  6.5 10e3/uL   HGB 10.9 (*) 13.0 - 17.1 g/dL   HCT 16.1 (*) 09.6 - 04.5 %   Platelets 48 (*) 140 - 400 10e3/uL   MCV 99.7 (*) 79.3 - 98.0 fL   MCH 33.4  27.2 - 33.4 pg   MCHC 33.5  32.0 - 36.0 g/dL    RBC 4.09 (*) 8.11 - 5.82 10e6/uL   RDW 16.7 (*) 11.0 - 14.6 %   lymph# 0.3 (*) 0.9 - 3.3 10e3/uL   MONO# 0.0 (*) 0.1 - 0.9 10e3/uL   Eosinophils Absolute 0.0  0.0 - 0.5 10e3/uL   Basophils Absolute 0.0  0.0 - 0.1 10e3/uL   NEUT% 65.9  39.0 - 75.0 %   LYMPH% 32.9  14.0 - 49.0 %   MONO% 1.2  0.0 - 14.0 %   EOS% 0.0  0.0 - 7.0 %   BASO% 0.0  0.0 - 2.0 %   nRBC 0  0 - 0 %  COMPREHENSIVE METABOLIC PANEL (CC13)      Result Value Range   Sodium 139  136 - 145 mEq/L   Potassium 4.4  3.5 - 5.1 mEq/L   Chloride 105  98 - 107 mEq/L   CO2 26  22 - 29 mEq/L   Glucose 98  70 - 99 mg/dl   BUN 91.4  7.0 - 78.2 mg/dL   Creatinine 1.3  0.7 - 1.3 mg/dL   Total Bilirubin 9.56  0.20 - 1.20 mg/dL   Alkaline Phosphatase 64  40 - 150 U/L   AST 15  5 - 34 U/L   ALT 15  0 - 55 U/L   Total Protein 6.9  6.4 - 8.3 g/dL   Albumin 3.7  3.5 - 5.0 g/dL   Calcium 9.3  8.4 - 21.3 mg/dL    Last neupogen was 0-86-57  Studies/Results:  No results found.  Medications: I have reviewed the patient's current medications. Colace as above.  His BP remains good off of antihypertensives now, had systolic <110 and was feeling more weak when losartan was DCd during hospitalization; still on toprol XL 50 mg and imdur 60 mg daily. Patient would like to be sure Dr Swaziland is aware and I will route this note to him.  Assessment/Plan:  1.Hairy cell leukemia: Diagnosed by bone marrow 03-29-12 when patient presented with rapidly progressing cellulitis from cat bite and was pancytopenic. Treatment was begun with cladribine using 0.15 mg/kg weekly x 6 schedule, first dose 04-05-12, total 6 weekly treatments planned. He remains neutropenic and thrombocytopenic.[name] He understands neutropenic precautions and will call if temp >=100.5 or other problems. Week 6 cladribine will be 05-10-12. We will see him and follow counts at least weekly for now. 2. CAD with MI and post CABG x4, HTN. On B-blocker per cardiology, off losartan as above.   3.dental abscess shortly prior to hairy cell diagnosis, resolved.  4.hx prostate cancer post radical prostatectomy 2008, "recent PSA undetectable" from urology note 10-2011  5. Constipation last week seems resolved. He understands risk of perirectal infection.  6. Daily ETOH prior to hospitalization, reportedly not drinking now.  7.hx GERD  8.Raynaud's  9. Past tobacco, DC 2006       Ettamae Barkett P, MD   05/07/2012, 8:21 PM

## 2012-05-07 NOTE — Patient Instructions (Signed)
When loose stools stop, begin Colace 100 mg each AM and each PM. This is just a stool softener.

## 2012-05-10 ENCOUNTER — Ambulatory Visit (HOSPITAL_BASED_OUTPATIENT_CLINIC_OR_DEPARTMENT_OTHER): Payer: Medicare Other

## 2012-05-10 ENCOUNTER — Other Ambulatory Visit: Payer: Self-pay | Admitting: Physician Assistant

## 2012-05-10 ENCOUNTER — Other Ambulatory Visit (HOSPITAL_BASED_OUTPATIENT_CLINIC_OR_DEPARTMENT_OTHER): Payer: Medicare Other | Admitting: Lab

## 2012-05-10 VITALS — BP 146/76 | HR 60 | Temp 97.9°F | Resp 20

## 2012-05-10 DIAGNOSIS — Z5111 Encounter for antineoplastic chemotherapy: Secondary | ICD-10-CM

## 2012-05-10 DIAGNOSIS — C9140 Hairy cell leukemia not having achieved remission: Secondary | ICD-10-CM

## 2012-05-10 LAB — CBC WITH DIFFERENTIAL/PLATELET
Basophils Absolute: 0 10*3/uL (ref 0.0–0.1)
Eosinophils Absolute: 0 10*3/uL (ref 0.0–0.5)
HGB: 11.2 g/dL — ABNORMAL LOW (ref 13.0–17.1)
LYMPH%: 60.7 % — ABNORMAL HIGH (ref 14.0–49.0)
MONO#: 0 10*3/uL — ABNORMAL LOW (ref 0.1–0.9)
NEUT#: 0.2 10*3/uL — CL (ref 1.5–6.5)
Platelets: 59 10*3/uL — ABNORMAL LOW (ref 140–400)
RBC: 3.31 10*6/uL — ABNORMAL LOW (ref 4.20–5.82)
WBC: 0.6 10*3/uL — CL (ref 4.0–10.3)
nRBC: 0 % (ref 0–0)

## 2012-05-10 MED ORDER — SODIUM CHLORIDE 0.9 % IV SOLN
Freq: Once | INTRAVENOUS | Status: AC
  Administered 2012-05-10: 12:00:00 via INTRAVENOUS

## 2012-05-10 MED ORDER — ONDANSETRON 8 MG/50ML IVPB (CHCC)
8.0000 mg | Freq: Once | INTRAVENOUS | Status: AC
  Administered 2012-05-10: 8 mg via INTRAVENOUS

## 2012-05-10 MED ORDER — SODIUM CHLORIDE 0.9 % IV SOLN
0.1500 mg/kg | Freq: Once | INTRAVENOUS | Status: AC
  Administered 2012-05-10: 14 mg via INTRAVENOUS
  Filled 2012-05-10: qty 14

## 2012-05-10 NOTE — Progress Notes (Signed)
Labs today were reported to [place], [place]. Per [place], [place] to treat despite counts.

## 2012-05-10 NOTE — Patient Instructions (Addendum)
[facility] Discharge Instructions for Patients Receiving Chemotherapy  Today you received the following chemotherapy agents Cladribine.  To help prevent nausea and vomiting after your treatment, we encourage you to take your nausea medication as prescribed.    If you develop nausea and vomiting that is not controlled by your nausea medication, call the clinic. If it is after clinic hours your family physician or the after hours number for the clinic or go to the Emergency Department.   BELOW ARE SYMPTOMS THAT SHOULD BE REPORTED IMMEDIATELY:  *FEVER GREATER THAN 100.5 F  *CHILLS WITH OR WITHOUT FEVER  NAUSEA AND VOMITING THAT IS NOT CONTROLLED WITH YOUR NAUSEA MEDICATION  *UNUSUAL SHORTNESS OF BREATH  *UNUSUAL BRUISING OR BLEEDING  TENDERNESS IN MOUTH AND THROAT WITH OR WITHOUT PRESENCE OF ULCERS  *URINARY PROBLEMS  *BOWEL PROBLEMS  UNUSUAL RASH Items with * indicate a potential emergency and should be followed up as soon as possible.  Please let the nurse know about any problems that you may have experienced. Feel free to call the clinic you have any questions or concerns. The clinic phone number is [phone].   I have been informed and understand all the instructions given to me. I know to contact the clinic, my physician, or go to the Emergency Department if any problems should occur. I do not have any questions at this time, but understand that I may call the clinic during office hours   should I have any questions or need assistance in obtaining follow up care.    __________________________________________  _____________  __________ Signature of Patient or Authorized Representative            Date                   Time    __________________________________________ Nurse's Signature    

## 2012-05-11 ENCOUNTER — Other Ambulatory Visit: Payer: Self-pay | Admitting: Oncology

## 2012-05-11 ENCOUNTER — Ambulatory Visit (HOSPITAL_BASED_OUTPATIENT_CLINIC_OR_DEPARTMENT_OTHER): Payer: Medicare Other

## 2012-05-11 VITALS — BP 131/70 | HR 58 | Temp 97.8°F

## 2012-05-11 DIAGNOSIS — D709 Neutropenia, unspecified: Secondary | ICD-10-CM

## 2012-05-11 DIAGNOSIS — L089 Local infection of the skin and subcutaneous tissue, unspecified: Secondary | ICD-10-CM

## 2012-05-11 DIAGNOSIS — C9140 Hairy cell leukemia not having achieved remission: Secondary | ICD-10-CM

## 2012-05-11 MED ORDER — FILGRASTIM 480 MCG/0.8ML IJ SOLN
480.0000 ug | Freq: Once | INTRAMUSCULAR | Status: AC
  Administered 2012-05-11: 480 ug via SUBCUTANEOUS
  Filled 2012-05-11: qty 0.8

## 2012-05-17 ENCOUNTER — Ambulatory Visit: Payer: Medicare Other

## 2012-05-17 ENCOUNTER — Other Ambulatory Visit: Payer: Medicare Other | Admitting: Lab

## 2012-05-17 ENCOUNTER — Telehealth: Payer: Self-pay | Admitting: *Deleted

## 2012-05-17 NOTE — Telephone Encounter (Signed)
Called patient to follow up on bruise on arm. Pt had called "on call" doctor on Thursday night due to a bruise on his arm after chemo. I spoke with patient on 5/2 and he was going to stop by [facility] for RN to look at arm. Spoke with patient today, 5/5 and he said he never came by because bruise went away.. No further problems

## 2012-05-18 ENCOUNTER — Encounter: Payer: Self-pay | Admitting: Oncology

## 2012-05-18 ENCOUNTER — Ambulatory Visit (HOSPITAL_BASED_OUTPATIENT_CLINIC_OR_DEPARTMENT_OTHER): Payer: Medicare Other | Admitting: Oncology

## 2012-05-18 ENCOUNTER — Other Ambulatory Visit: Payer: Self-pay

## 2012-05-18 ENCOUNTER — Telehealth: Payer: Self-pay | Admitting: Oncology

## 2012-05-18 ENCOUNTER — Other Ambulatory Visit (HOSPITAL_BASED_OUTPATIENT_CLINIC_OR_DEPARTMENT_OTHER): Payer: Medicare Other | Admitting: Lab

## 2012-05-18 VITALS — BP 136/70 | HR 61 | Temp 98.3°F | Resp 18 | Ht 71.0 in | Wt 194.3 lb

## 2012-05-18 DIAGNOSIS — C9140 Hairy cell leukemia not having achieved remission: Secondary | ICD-10-CM

## 2012-05-18 DIAGNOSIS — D709 Neutropenia, unspecified: Secondary | ICD-10-CM

## 2012-05-18 DIAGNOSIS — S61459A Open bite of unspecified hand, initial encounter: Secondary | ICD-10-CM

## 2012-05-18 DIAGNOSIS — Z8546 Personal history of malignant neoplasm of prostate: Secondary | ICD-10-CM

## 2012-05-18 DIAGNOSIS — L089 Local infection of the skin and subcutaneous tissue, unspecified: Secondary | ICD-10-CM

## 2012-05-18 DIAGNOSIS — D696 Thrombocytopenia, unspecified: Secondary | ICD-10-CM

## 2012-05-18 DIAGNOSIS — E119 Type 2 diabetes mellitus without complications: Secondary | ICD-10-CM

## 2012-05-18 LAB — CBC WITH DIFFERENTIAL/PLATELET
Basophils Absolute: 0 10*3/uL (ref 0.0–0.1)
HCT: 34.4 % — ABNORMAL LOW (ref 38.4–49.9)
HGB: 11.7 g/dL — ABNORMAL LOW (ref 13.0–17.1)
MCH: 34.7 pg — ABNORMAL HIGH (ref 27.2–33.4)
MONO#: 0.1 10*3/uL (ref 0.1–0.9)
NEUT%: 61.3 % (ref 39.0–75.0)
WBC: 0.9 10*3/uL — CL (ref 4.0–10.3)
lymph#: 0.3 10*3/uL — ABNORMAL LOW (ref 0.9–3.3)

## 2012-05-18 MED ORDER — FILGRASTIM 480 MCG/0.8ML IJ SOLN
480.0000 ug | Freq: Once | INTRAMUSCULAR | Status: AC
  Administered 2012-05-18: 480 ug via SUBCUTANEOUS
  Filled 2012-05-18: qty 0.8

## 2012-05-18 MED ORDER — LEVOFLOXACIN 500 MG PO TABS: 500.0000 mg | ORAL_TABLET | Freq: Every day | ORAL | Status: DC

## 2012-05-18 NOTE — Progress Notes (Signed)
OFFICE PROGRESS NOTE   05/18/2012   Physicians:T.Randall An.Hopper, H.Coley, Nash.Jordan, D.Grapey, R.Groat   INTERVAL HISTORY:  Patient is seen, together with wife, in continuing attention to his hairy cell leukemia, now having completed 6 weekly treatments of cladribine from 04-05-12 thru 05-10-12, with some neupogen support. He has been pancytopenic thru all of the treatment, but has had no infectious problems since initial presentation, no bleeding and has not required PRBCs. He does not have a central catheter.    Patient was hospitalized from 3-13 thru 04-02-12 at [facility] in [place], after presenting to primary physician Dr Alwyn Ren with cellulitis and shaking chills <24 hours after his cat bit right hand at base of thumb. He was pancytopenic on admission. Bone marrow exam 03-29-2012 showed hypercellular marrow with B cell lymphoproliferative process, with flow cytometry consistent with hairy cell leukemia. Essentially no aspirate was obtained at that bone marrow exam. The RUE cellulitis progressively improved with IV antibiotics (vanc + zosyn initially, then zosyn alone) and he was otherwise stable while in hospital. All cultures were negative. He was followed in hospital by hand surgeon, with I&D x2 of the wound at thumb base. He declined transfer to university hospital although I did discuss the case with attending physicians at 3 university centers. He saw Dr Herbert Moors at [facility] on 04-08-12 in consultation, with agreement concerning treatment plan here. He began cladribine at [facility] on 04-05-12, planned weekly x6 instead of single continuous infusion course due to pancytopenia at diagnosis. He has remained pancytopenic but has had no other infectious complications, no bleeding, and has essentially healed the cat bite. He remains on prophylactic diflucan, acyclovir, TMP/SMX and levaquin; he has had neupogen injections  Extensive bruising left forearm around site of last IV, which  progressed after he returned home after treatment; he did not come to [facility] for evaluation after he phoned about this. Area not sore, not red or hot, seems to be improving. No other bleeding. No increased SOB, no cough or sinus congestion. No pain. Still a little constipated tho stool softeners and prn laxative helpful, no tenderness rectal area. No fever. Appetite not back to baseline. No significant nausea. No abdominal pain. Remainder of 10 point Review of Systems negative.  Objective:  Vital signs in last 24 hours:  BP 136/70  Pulse 61  Temp(Src) 98.3 F (36.8 C) (Oral)  Resp 18  Ht 5\' 11"  (1.803 m)  Wt 194 lb 4.8 oz (88.134 kg)  BMI 27.11 kg/m2  Alert, easily mobile, looks comfortable, respirations not labored RA  HEENT:PERRLA, sclera clear, anicteric and oropharynx clear, no lesions Superficial skin tear left buccal mucosa where he bit, no obvious infection and no thrush. No alopecia. LymphaticsCervical, supraclavicular, and axillary nodes normal. Resp: clear to auscultation bilaterally and normal percussion bilaterally Cardio: regular rate and rhythm GI: soft, non-tender; bowel sounds normal; no masses,  no organomegaly Extremities: extremities normal, atraumatic, no cyanosis or edema  Other than LUE as below. Area of cat bite minimal residual erythema only. Neuro:no sensory deficits noted Deafness chronic. Skin: large resolving ecchymosis left dorsal forearm, no streaking, cord, erythema, tenderness.   Lab Results:  Results for orders placed in visit on 05/18/12  CBC WITH DIFFERENTIAL      Result Value Range   WBC 0.9 (*) 4.0 - 10.3 10e3/uL   NEUT# 0.6 (*) 1.5 - 6.5 10e3/uL   HGB 11.7 (*) 13.0 - 17.1 g/dL   HCT 16.1 (*) 09.6 - 04.5 %   Platelets 70 (*)  140 - 400 10e3/uL   MCV 101.7 (*) 79.3 - 98.0 fL   MCH 34.7 (*) 27.2 - 33.4 pg   MCHC 34.1  32.0 - 36.0 g/dL   RBC 1.61 (*) 0.96 - 0.45 10e6/uL   RDW 17.8 (*) 11.0 - 14.6 %   lymph# 0.3 (*) 0.9 - 3.3 10e3/uL   MONO#  0.1  0.1 - 0.9 10e3/uL   Eosinophils Absolute 0.0  0.0 - 0.5 10e3/uL   Basophils Absolute 0.0  0.0 - 0.1 10e3/uL   NEUT% 61.3  39.0 - 75.0 %   LYMPH% 27.8  14.0 - 49.0 %   MONO% 9.3  0.0 - 14.0 %   EOS% 0.8  0.0 - 7.0 %   BASO% 0.8  0.0 - 2.0 %    Last CMET 05-07-12 entirely normal  Studies/Results:  No results found.  Medications: I have reviewed the patient's current medications. He will continue levaquin, diflucan, acyclovir, TMP SMX until ANC out of neutropenic range. Will add biotene mouthwash regularly. Neupogen was repeated today and we decide if this is needed further depending on situation next week.  Assessment/Plan:  1.Hairy cell leukemia: Diagnosed by bone marrow 03-29-12 when patient presented with rapidly progressing cellulitis from cat bite and was pancytopenic. Treatment with cladribine using 0.15 mg/kg weekly x 6 schedule, first dose 04-05-12, total 6 weekly treatments administered thru 05-10-2012. He remains neutropenic and thrombocytopenic, tho counts are slightly better today.  We will see him and follow counts at least weekly for now. He will need repeat bone marrow exam after counts hopefully recover, to evaluate for any residual disease, either at [facility] or possibly here. 2. CAD with MI and post CABG x4, HTN. On B-blocker per cardiology, off losartan still.  3.dental abscess shortly prior to hairy cell diagnosis, resolved.  4.hx prostate cancer post radical prostatectomy 2008, "recent PSA undetectable" from urology note 10-2011  5. Constipation improved, continuing stool softener etc. 6. Daily ETOH prior to hospitalization, reportedly not drinking now.  7.hx GERD  8.Raynaud's  9. Past tobacco, DC 2006   LIVESAY,Derek P, MD   05/18/2012, 1:12 PM

## 2012-05-18 NOTE — Patient Instructions (Signed)
Biotene mouthwash 3-4x daily to keep raw areas in mouth from getting infected.

## 2012-05-23 ENCOUNTER — Other Ambulatory Visit: Payer: Self-pay | Admitting: Oncology

## 2012-05-24 ENCOUNTER — Ambulatory Visit: Payer: Medicare Other

## 2012-05-24 ENCOUNTER — Other Ambulatory Visit: Payer: Medicare Other | Admitting: Lab

## 2012-05-25 ENCOUNTER — Ambulatory Visit (HOSPITAL_BASED_OUTPATIENT_CLINIC_OR_DEPARTMENT_OTHER): Payer: Medicare Other

## 2012-05-25 ENCOUNTER — Other Ambulatory Visit: Payer: Self-pay | Admitting: Oncology

## 2012-05-25 ENCOUNTER — Telehealth: Payer: Self-pay

## 2012-05-25 VITALS — BP 122/77 | HR 72 | Temp 97.9°F | Resp 20

## 2012-05-25 DIAGNOSIS — W5501XA Bitten by cat, initial encounter: Secondary | ICD-10-CM

## 2012-05-25 DIAGNOSIS — C9140 Hairy cell leukemia not having achieved remission: Secondary | ICD-10-CM

## 2012-05-25 DIAGNOSIS — D709 Neutropenia, unspecified: Secondary | ICD-10-CM

## 2012-05-25 DIAGNOSIS — L089 Local infection of the skin and subcutaneous tissue, unspecified: Secondary | ICD-10-CM

## 2012-05-25 MED ORDER — FILGRASTIM 480 MCG/0.8ML IJ SOLN
480.0000 ug | Freq: Once | INTRAMUSCULAR | Status: AC
  Administered 2012-05-25: 480 ug via SUBCUTANEOUS
  Filled 2012-05-25: qty 0.8

## 2012-05-25 NOTE — Telephone Encounter (Signed)
[name] wants to visit grandchildren who are visiting from [place] today. Dr. Darrold Span said that it would be fine to visit them if they are not sick. He needs to come in ofr Neupogen injection today prior to visit.  He will come in at 1130 prior to going to [place].  He will keep appointment as scheduled with Dr. Darrold Span tomorrow.

## 2012-05-26 ENCOUNTER — Other Ambulatory Visit (HOSPITAL_BASED_OUTPATIENT_CLINIC_OR_DEPARTMENT_OTHER): Payer: Medicare Other | Admitting: Lab

## 2012-05-26 ENCOUNTER — Telehealth: Payer: Self-pay | Admitting: Oncology

## 2012-05-26 ENCOUNTER — Ambulatory Visit (HOSPITAL_BASED_OUTPATIENT_CLINIC_OR_DEPARTMENT_OTHER): Payer: Medicare Other | Admitting: Oncology

## 2012-05-26 ENCOUNTER — Encounter: Payer: Self-pay | Admitting: Oncology

## 2012-05-26 VITALS — BP 125/69 | HR 63 | Temp 97.3°F | Resp 18 | Ht 71.0 in | Wt 196.2 lb

## 2012-05-26 DIAGNOSIS — C9140 Hairy cell leukemia not having achieved remission: Secondary | ICD-10-CM

## 2012-05-26 DIAGNOSIS — E119 Type 2 diabetes mellitus without complications: Secondary | ICD-10-CM

## 2012-05-26 DIAGNOSIS — Z8546 Personal history of malignant neoplasm of prostate: Secondary | ICD-10-CM

## 2012-05-26 LAB — CBC WITH DIFFERENTIAL/PLATELET
BASO%: 0.3 % (ref 0.0–2.0)
Eosinophils Absolute: 0 10*3/uL (ref 0.0–0.5)
MCHC: 33.7 g/dL (ref 32.0–36.0)
MCV: 101.1 fL — ABNORMAL HIGH (ref 79.3–98.0)
MONO%: 7.4 % (ref 0.0–14.0)
NEUT#: 7.4 10*3/uL — ABNORMAL HIGH (ref 1.5–6.5)
RBC: 3.42 10*6/uL — ABNORMAL LOW (ref 4.20–5.82)
RDW: 17.9 % — ABNORMAL HIGH (ref 11.0–14.6)
WBC: 8.4 10*3/uL (ref 4.0–10.3)

## 2012-05-26 LAB — HEMOGLOBIN A1C
Hgb A1c MFr Bld: 5.9 % — ABNORMAL HIGH (ref <5.7)
Mean Plasma Glucose: 123 mg/dL — ABNORMAL HIGH (ref <117)

## 2012-05-26 NOTE — Telephone Encounter (Signed)
gv and printeda ppt sched and avs for pt....gv referral to [employer] [employer]

## 2012-05-26 NOTE — Progress Notes (Signed)
OFFICE PROGRESS NOTE   05/26/2012   Physicians:T.Randall An.Hopper, H.Coley, P.Jordan, D.Grapey, R.Groat   INTERVAL HISTORY:   Patient is seen, alone for visit, in continuing attention to his hairy cell leukemia for which he was treated with 6 cycles of weekly cladribine from 04-05-12 thru 05-10-12, now following to see what recovery of counts will result. The weekly cladribine was chosen due to pancytopenia at presentation, and he tolerated treatment with no further infections, no bleeding, and no transfusions required. He has had some neupogen thru this course, last on 05-25-12 (prior to seeing infant and toddler grandchildren visiting from out of state). He continues prophylactic diflucan, acyclovir, levaquin and TMP/SMX. (see below).  Patient was hospitalized from 3-13 thru 04-02-12 at [facility] in [place], after presenting to primary physician Dr Alwyn Ren with cellulitis and shaking chills <24 hours after cat bite on right hand at base of thumb. He was pancytopenic on admission. Bone marrow exam 03-29-2012 showed hypercellular marrow with B cell lymphoproliferative process, with flow cytometry consistent with hairy cell leukemia. Essentially no aspirate was obtained at that bone marrow exam. Spleen was not enlarged by [place] 03-29-12 (10.3 cm with volume 288.4 ml). The RUE cellulitis progressively improved with IV antibiotics (vanc + zosyn initially, then zosyn alone) and he was otherwise stable in hospital. All cultures were negative. He was followed in hospital by hand surgeon, with I&D x2 of the wound at thumb base. He declined transfer to university hospital although I did discuss the case with attending physicians at 3 university centers. He saw Dr Herbert Moors at [facility] on 04-08-12 in consultation, with agreement concerning treatment plan here. He began cladribine at [facility] on 04-05-12, planned weekly x6 instead of single continuous infusion course due to pancytopenia at diagnosis. He has  remained pancytopenic but has had no other infectious complications, no bleeding, and the cat bite has healed.  Still fatigue moreso than baseline, tho is able to be somewhat more active than several weeks ago, including some yard work this week.He has small bruise without skin break from [place]. Slightly SOB still walking into office from parking lot today. No bleeding, No fever or symptoms of infection. Appetite seems better, enjoying ice cream. No pain. No dental symptoms. No voiding symptoms. Still some constipation, can increase colace. He has had some joint soreness, possibly from most recent gCSF, better with activity. We have discussed increasing activity gradually as he tolerates. Remainder of 10 point Review of Systems negative.  Objective:  Vital signs in last 24 hours:  BP 125/69  Pulse 63  Temp(Src) 97.3 F (36.3 C) (Oral)  Resp 18  Ht 5\' 11"  (1.803 m)  Wt 196 lb 3.2 oz (88.996 kg)  BMI 27.38 kg/m2  Weight is up 2 lbs. Easily ambulatory, looks comfortable, color good.  HEENT:PERRLA, sclera clear, anicteric and oropharynx clear, no lesions LymphaticsCervical, supraclavicular, and axillary nodes normal. Resp: clear to auscultation bilaterally and normal percussion bilaterally Cardio: regular rate and rhythm GI: soft, non-tender; bowel sounds normal; no masses,  no organomegaly Extremities: extremities normal, atraumatic, no cyanosis or edema. Minimal erythema at base of right thumb, otherwise that area not remarkable. Neuro: stable deafness Skin without rash or petechiae. 0.5 cm ecchymosis LUE from [place]  Lab Results:  Results for orders placed in visit on 05/26/12  CBC WITH DIFFERENTIAL      Result Value Range   WBC 8.4  4.0 - 10.3 10e3/uL   NEUT# 7.4 (*) 1.5 - 6.5 10e3/uL   HGB  11.6 (*) 13.0 - 17.1 g/dL   HCT 16.1 (*) 09.6 - 04.5 %   Platelets 85 (*) 140 - 400 10e3/uL   MCV 101.1 (*) 79.3 - 98.0 fL   MCH 34.1 (*) 27.2 - 33.4 pg   MCHC 33.7  32.0 - 36.0  g/dL   RBC 4.09 (*) 8.11 - 9.14 10e6/uL   RDW 17.9 (*) 11.0 - 14.6 %   lymph# 0.4 (*) 0.9 - 3.3 10e3/uL   MONO# 0.6  0.1 - 0.9 10e3/uL   Eosinophils Absolute 0.0  0.0 - 0.5 10e3/uL   Basophils Absolute 0.0  0.0 - 0.1 10e3/uL   NEUT% 87.8 (*) 39.0 - 75.0 %   LYMPH% 4.3 (*) 14.0 - 49.0 %   MONO% 7.4  0.0 - 14.0 %   EOS% 0.2  0.0 - 7.0 %   BASO% 0.3  0.0 - 2.0 %    We will recheck CBC next week and with my visit in 2 weeks. He did have neupogen on 5-6, but even with gCSF this is first time that he has been out of neutropenic range at all.  Studies/Results:  No results found.  Medications: I have reviewed the patient's current medications. Note losartan was DCd during hospitalization for systolic BP <110 and weak then; continues toprol XL 50 mg and imdur 60 mg. He is probably due back to Dr Swaziland and he will call that office to schedule; I have told him that I will send this information to Dr Swaziland also. With [facility] much better, he will hold diflucan, acyclovir and levaquin now; he will continue TMP/SMX 3x weekly for now..  Assessment/Plan: 1.Hairy cell leukemia: history as above, weekly cladribine x 6 given thru 05-10-12. Will repeat CBC next week and I will see him in 2 weeks. I would like for him to have follow up with Dr Sharyne Richters at [facility], to include repeat bone marrow exam there if indicated (hopefully for minimal residual disease if any). 2. CAD with MI and post CABG x4, HTN. On B-blocker per cardiology, off losartan since hospitalization in March. I will let Dr Swaziland know by this note and patient will follow up with that office as planned. 3.dental abscess shortly prior to hairy cell diagnosis, resolved.  4.hx prostate cancer post radical prostatectomy 2008, "recent PSA undetectable" from urology note 10-2011  5. Constipation better  6. Daily ETOH prior to hospitalization, which he has avoided thru treatment. He has asked today if he can have some alcohol, and I have stressed in  moderation.  7.hx GERD  8.Raynaud's  9. Past tobacco, DC 2006  Patient is pleased with improvement in counts today. Time spent 25 min including >50% discussion.  LIVESAY,LENNIS P, MD   05/26/2012, 4:31 PM

## 2012-05-26 NOTE — Patient Instructions (Addendum)
Stop diflucan (fluconazole), acyclovir(zovirax) and levaquin (levofloxacin).  Continue TMP/SMX (septra, trimethoprin sulfa) for now

## 2012-05-27 ENCOUNTER — Encounter: Payer: Self-pay | Admitting: Oncology

## 2012-05-27 NOTE — Progress Notes (Signed)
[facility] END OF TREATMENT   Name: [name] Date: 05/27/2012 MRN: [id number] DOB: 08/31/1934   TREATMENT DATES: 04-05-12 thru 05-10-12   REFERRING PHYSICIAN: [name]  DIAGNOSIS: hairy cell leukemia   STAGE AT START OF TREATMENT: NA   INTENT: curative   DRUGS OR REGIMENS GIVEN: weekly cladribine x 6 dosed 0.15 mg/kg = 14 mg weekly x 6   MAJOR TOXICITIES: continued pancytopenia, fatigue. Cellulitis from cat bite RUE on presentation.   REASON TREATMENT STOPPED: completion of planned course   PERFORMANCE STATUS AT END: 1   ONGOING PROBLEMS: pancytopenia, fatigue   FOLLOW UP PLANS: weekly blood counts and close office followup pending recovery of counts. Follow up with Dr Sharyne Richters to include possible repeat bone marrow exam.    Ila Mcgill, MD

## 2012-05-28 ENCOUNTER — Telehealth: Payer: Self-pay | Admitting: Oncology

## 2012-05-28 NOTE — Telephone Encounter (Signed)
Sent the last office note to Dr. Herbert Moors office from Dr. Darrold Span

## 2012-05-31 ENCOUNTER — Telehealth: Payer: Self-pay | Admitting: *Deleted

## 2012-05-31 NOTE — Telephone Encounter (Signed)
Message copied by Phillis Knack on Mon May 31, 2012 10:35 AM ------      Message from: Lorine Bears      Created: Fri May 28, 2012  7:02 PM                   ----- Message -----         From: Reece Packer, MD         Sent: 05/27/2012  11:04 PM           To: Lorine Bears, RN            Labs seen and need follow up: please let him know Hgb A1c 5.9, just barely above normal 5.7, so I think Dr Alwyn Ren will probably think this is ok. I will send result to Dr Alwyn Ren. ------

## 2012-05-31 NOTE — Telephone Encounter (Signed)
CBC and Hgb A1c results faxed to DR [facility]

## 2012-05-31 NOTE — Telephone Encounter (Signed)
Pt notified of results below. Spoke with Dr Brown Human office, they can see Mr [name] 5/29/@ 2:00. Pt notified

## 2012-06-01 ENCOUNTER — Telehealth: Payer: Self-pay

## 2012-06-01 NOTE — Telephone Encounter (Signed)
Faxed information to Dr. Sharyne Richters as noted by Dr. Darrold Span below.

## 2012-06-01 NOTE — Telephone Encounter (Signed)
Message copied by Lorine Bears on Tue Jun 01, 2012  2:59 PM ------      Message from: Jama Flavors P      Created: Thu May 27, 2012  2:18 PM        Please track when appointment will be with Dr Herbert Moors at [facility] and send cumulative labs, my note 5-14 and end of treatment note "documentation" 05-27-12 to that MD (as he is not in EMR to let me route to him)  thanks ------

## 2012-06-02 ENCOUNTER — Other Ambulatory Visit (HOSPITAL_BASED_OUTPATIENT_CLINIC_OR_DEPARTMENT_OTHER): Payer: Medicare Other

## 2012-06-02 ENCOUNTER — Telehealth: Payer: Self-pay | Admitting: *Deleted

## 2012-06-02 DIAGNOSIS — C9140 Hairy cell leukemia not having achieved remission: Secondary | ICD-10-CM

## 2012-06-02 LAB — CBC WITH DIFFERENTIAL/PLATELET
BASO%: 0.5 % (ref 0.0–2.0)
Basophils Absolute: 0 10*3/uL (ref 0.0–0.1)
EOS%: 0.6 % (ref 0.0–7.0)
HGB: 12.4 g/dL — ABNORMAL LOW (ref 13.0–17.1)
MCH: 34.6 pg — ABNORMAL HIGH (ref 27.2–33.4)
MCHC: 34.4 g/dL (ref 32.0–36.0)
MONO%: 20.4 % — ABNORMAL HIGH (ref 0.0–14.0)
RBC: 3.59 10*6/uL — ABNORMAL LOW (ref 4.20–5.82)
RDW: 17.7 % — ABNORMAL HIGH (ref 11.0–14.6)
lymph#: 0.4 10*3/uL — ABNORMAL LOW (ref 0.9–3.3)

## 2012-06-02 NOTE — Telephone Encounter (Signed)
Pt notified of results below 

## 2012-06-02 NOTE — Telephone Encounter (Signed)
Message copied by Phillis Knack on Wed Jun 02, 2012  3:51 PM ------      Message from: Lorine Bears      Created: Wed Jun 02, 2012  3:13 PM                   ----- Message -----         From: Reece Packer, MD         Sent: 06/02/2012  10:22 AM           To: Lorine Bears, RN            Labs seen and need follow up: please let him know white count is still out of neutropenic range even without shot this week - lower than last week probably because of shot last week, but still better than previously. Hgb and plt continue to improve. Keep visit next week as planned. ------

## 2012-06-09 ENCOUNTER — Ambulatory Visit (HOSPITAL_BASED_OUTPATIENT_CLINIC_OR_DEPARTMENT_OTHER): Payer: Medicare Other | Admitting: Oncology

## 2012-06-09 ENCOUNTER — Other Ambulatory Visit (HOSPITAL_BASED_OUTPATIENT_CLINIC_OR_DEPARTMENT_OTHER): Payer: Medicare Other | Admitting: Lab

## 2012-06-09 ENCOUNTER — Telehealth: Payer: Self-pay

## 2012-06-09 ENCOUNTER — Telehealth: Payer: Self-pay | Admitting: Oncology

## 2012-06-09 ENCOUNTER — Encounter: Payer: Self-pay | Admitting: Oncology

## 2012-06-09 VITALS — BP 132/75 | HR 73 | Temp 98.0°F | Resp 18 | Ht 71.0 in | Wt 199.5 lb

## 2012-06-09 DIAGNOSIS — C9140 Hairy cell leukemia not having achieved remission: Secondary | ICD-10-CM

## 2012-06-09 DIAGNOSIS — Z8546 Personal history of malignant neoplasm of prostate: Secondary | ICD-10-CM

## 2012-06-09 LAB — CBC WITH DIFFERENTIAL/PLATELET
Eosinophils Absolute: 0 10*3/uL (ref 0.0–0.5)
HCT: 37.1 % — ABNORMAL LOW (ref 38.4–49.9)
LYMPH%: 12.8 % — ABNORMAL LOW (ref 14.0–49.0)
MCHC: 33.8 g/dL (ref 32.0–36.0)
MCV: 100.8 fL — ABNORMAL HIGH (ref 79.3–98.0)
MONO#: 0.5 10*3/uL (ref 0.1–0.9)
MONO%: 15 % — ABNORMAL HIGH (ref 0.0–14.0)
NEUT#: 2.5 10*3/uL (ref 1.5–6.5)
NEUT%: 70.9 % (ref 39.0–75.0)
Platelets: 117 10*3/uL — ABNORMAL LOW (ref 140–400)
WBC: 3.5 10*3/uL — ABNORMAL LOW (ref 4.0–10.3)

## 2012-06-09 NOTE — Telephone Encounter (Signed)
Faxed CBC from today for appointment with Dr. Sharyne Richters 06-10-12. Pt. Hand carring labs and other information printed by Dr. Darrold Span.

## 2012-06-09 NOTE — Progress Notes (Signed)
OFFICE PROGRESS NOTE   06/09/2012   Physicians:T.Randall An.Hopper, H.Coley, P.Jordan, D.Grapey, R.Groat   INTERVAL HISTORY:   Patient is seen, together with wife, in continued attention to recent treatment of hairy cell leukemia with weekly cladribine x 6 given 04-05-12 thru 05-10-12. He is still more fatigued with exertion than his baseline, and has been anxious about his counts, but otherwise appetite is better, constipation improved and he has had no symptoms of infection and no bleeding. He is to see Dr Herbert Moors (leukemia service [facility]) on 06-10-12. He continues prophylacticTMP/SMX, but is off levaquin, diflucan, acyclovir.  Oncologic History Patient was hospitalized from 3-13 thru 04-02-12 at [facility] in [place], after presenting to primary physician Dr Alwyn Ren with cellulitis and shaking chills <24 hours after cat bite on right hand. He was pancytopenic on presentation. Bone marrow exam 03-29-2012 showed hypercellular marrow with B cell lymphoproliferative process, with flow cytometry consistent with hairy cell leukemia. Essentially no aspirate was obtained with that bone marrow exam. Spleen was not enlarged by [place] 03-29-12 (10.3 cm with volume 288.4 ml). The RUE cellulitis progressively improved with IV antibiotics (vanc + zosyn initially, then zosyn alone) and he was otherwise stable in hospital. All cultures were negative. He was followed in hospital by hand surgeon, with I&D x2 of the wound at thumb base. He [facility] to university hospital although I did discuss the case with attending physicians at 3 university centers. He saw Dr Herbert Moors at [facility] on 04-08-12 in consultation, with agreement concerning treatment plan here. He began cladribine at [facility] on 04-05-12, given weekly x6 instead of single continuous infusion course due to pancytopenia at diagnosis, completed on  05-10-12.He remained pancytopenic thru the cladribine treatment, but had no other infectious  complications, no bleeding, and the cat bite has healed. He was given intermittent neupogen thru treatment, which did improve ANC to ~ 0.5 from ~ 0.2 during that time.  Counts have been improving in interval since cladribine completed.   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 Not SOB at rest. 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 Notices more pain in hips and knees when he first gets up, better with activity. Remainder of 10 point Review of Systems negative/ unchanged.  We have discussed trying to gradually increase activity at home; he does have stationary bike which he used previously in cardiac rehab. Next appointment with Dr Swaziland is due in ~ July per that office now, and we will schedule that.  Objective:  Vital signs in last 24 hours:  BP 132/75  Pulse 73  Temp(Src) 98 F (36.7 C) (Oral)  Resp 18  Ht 5\' 11"  (1.803 m)  Wt 199 lb 8 oz (90.493 kg)  BMI 27.84 kg/m2  SpO2 100%  Weight is up 3 lbs. Easily mobile, respirations not labored in exam room. Looks comfortable  HEENT:PERRLA, sclera clear, anicteric and oropharynx clear, no lesions LymphaticsCervical, supraclavicular, and axillary nodes normal. Resp: clear to auscultation bilaterally and normal percussion bilaterally Cardio: regular rate and rhythm GI: soft, non-tender; bowel sounds normal; no masses,  no organomegaly Extremities: extremities normal, atraumatic, no cyanosis or edema Neuro: chronic mils deafness Skin without rash or ecchymosis, no petechiae No central catheter  Lab Results:  Results for orders placed in visit on 06/09/12  CBC WITH DIFFERENTIAL      Result Value Range   WBC 3.5 (*) 4.0 -  10.3 10e3/uL   NEUT# 2.5  1.5 - 6.5 10e3/uL   HGB 12.5 (*) 13.0 - 17.1 g/dL   HCT 16.1 (*) 09.6 - 04.5 %   Platelets 117 (*) 140 - 400 10e3/uL   MCV 100.8 (*) 79.3 - 98.0 fL   MCH 34.0 (*) 27.2 - 33.4 pg    MCHC 33.8  32.0 - 36.0 g/dL   RBC 4.09 (*) 8.11 - 9.14 10e6/uL   RDW 16.7 (*) 11.0 - 14.6 %   lymph# 0.4 (*) 0.9 - 3.3 10e3/uL   MONO# 0.5  0.1 - 0.9 10e3/uL   Eosinophils Absolute 0.0  0.0 - 0.5 10e3/uL   Basophils Absolute 0.0  0.0 - 0.1 10e3/uL   NEUT% 70.9  39.0 - 75.0 %   LYMPH% 12.8 (*) 14.0 - 49.0 %   MONO% 15.0 (*) 0.0 - 14.0 %   EOS% 0.7  0.0 - 7.0 %   BASO% 0.6  0.0 - 2.0 %     Studies/Results:  No results found.  Medications: I have reviewed the patient's current medications. He will discuss length of Bactrim treatment with Dr Sharyne Richters  Assessment/Plan: 1.Hairy cell leukemia: weekly cladribine x 6 completed 05-10-12, with clear progressive improvement in counts now. He will have follow up with Dr Sharyne Richters on 06-10-12. I will see him back with counts in 3-4 weeks or sooner if needed. If repeat bone marrow is appropriate, I have recommended that this be done at [facility]. 2.CAD with MI and post CABG x4, HTN.  Followed by Dr Sw[place]iland 3. Hx prostate cancer post radical prostatectomy 2008, last PSA undetectable per Dr Isabel Caprice 4.daily ETOH prior to hairy cell leukemia diagnosis, tho he did not drink any alcohol during treatment 5.hx GERD, Raynaud's, tobacco DCd 2006   Patient and wife have had questions answered to their satisfaction and are in agreement with plan above.       Reece Packer, MD   06/09/2012, 8:49 PM

## 2012-06-09 NOTE — Telephone Encounter (Signed)
gv pt appt schedule for June. Next available appt w/Dr. Swaziland ([facility]) is August. Pt was scheduled for 8/1. Pt aware no July availability and does not feel August is a problem. Staff message to LL.

## 2012-06-09 NOTE — Patient Instructions (Addendum)
Dr Sharyne Richters should let us know if/ when you should have another bone marrow exam, which would be best done at [facility] if so, and should give recommendation for length of time to continue TMP/SMX antibiotic.

## 2012-06-11 ENCOUNTER — Telehealth: Payer: Self-pay | Admitting: Medical Oncology

## 2012-06-11 NOTE — Telephone Encounter (Signed)
He said he was told to call Dr Darrold Span after he saw Dr Sharyne Richters. He saw Dr Sharyne Richters yesterday and was told by him that " everything looked good. He said he may keep me on sulfa for 6 months." He also had a BM BX yesterday. I told Mr. Strothers I would notify Dr Darrold Span.

## 2012-06-15 ENCOUNTER — Telehealth: Payer: Self-pay

## 2012-06-15 NOTE — Telephone Encounter (Signed)
Told Mr. Goodner that Dr. Darrold Span is satisfied with the information relayed from his appointment regarding having a bone marrow bx 06-11-12.  Dr. Darrold Span will need the results of BM BX  along with Dr. Brown Human office note to discuss further plans. Mr. Tengan frustrated that he was told that the results of the BM BX would be available in a couple of days and now being told by Dr. Sharyne Richters that it would be ~5 days.  Mr. Binford is aware of appointment on 07-12-12 with  Dr. Darrold Span for follow up. If Dr. Darrold Span needs to see or speak with Mr. Withey prior to this visit, she or her office with contact him.

## 2012-06-16 ENCOUNTER — Telehealth: Payer: Self-pay | Admitting: Oncology

## 2012-06-16 NOTE — Telephone Encounter (Signed)
Medical Oncology  Phone call from Dr Herbert Moors at [facility] to let me know that bone marrow repeated there last week is consistent with complete remission of the hairy cell leukemia, tho not unexpectedly still slightly hypocellular related to the cladribine. He recommends following ~ q 3 mo x 12 - 18 months then can stretch out interval; Dr Sharyne Richters is glad to follow at [facility] if patient would like.  Ila Mcgill, MD

## 2012-06-22 ENCOUNTER — Telehealth: Payer: Self-pay | Admitting: Oncology

## 2012-06-22 NOTE — Telephone Encounter (Signed)
Medical Oncology  Returned call to patient at his request. As I will not be at [facility] after July, he would like to be followed at [facility] by Dr Sharyne Richters. He will keep appointment with me as scheduled 07-12-12 and we can set up appointment with Dr Sharyne Richters if not already scheduled by then, probably 2- 3 months from upcoming visit here.  Dr Sharyne Richters had already expressed to me his willingness to follow Mr Pellot. Patient expressed his appreciation for care by this physician.  Ila Mcgill, MD

## 2012-06-22 NOTE — Telephone Encounter (Signed)
Medical Oncology  Tried to return patient's call at his request. LM on home phone and cell.  Ila Mcgill, MD

## 2012-06-29 ENCOUNTER — Other Ambulatory Visit: Payer: Self-pay | Admitting: Oncology

## 2012-06-29 DIAGNOSIS — C9140 Hairy cell leukemia not having achieved remission: Secondary | ICD-10-CM

## 2012-07-11 ENCOUNTER — Other Ambulatory Visit: Payer: Self-pay | Admitting: Oncology

## 2012-07-12 ENCOUNTER — Other Ambulatory Visit (HOSPITAL_BASED_OUTPATIENT_CLINIC_OR_DEPARTMENT_OTHER): Payer: Medicare Other | Admitting: Lab

## 2012-07-12 ENCOUNTER — Encounter: Payer: Self-pay | Admitting: Oncology

## 2012-07-12 ENCOUNTER — Ambulatory Visit (HOSPITAL_BASED_OUTPATIENT_CLINIC_OR_DEPARTMENT_OTHER): Payer: Medicare Other | Admitting: Oncology

## 2012-07-12 VITALS — BP 127/74 | HR 80 | Temp 98.0°F | Resp 20 | Ht 73.0 in | Wt 200.3 lb

## 2012-07-12 DIAGNOSIS — C9140 Hairy cell leukemia not having achieved remission: Secondary | ICD-10-CM

## 2012-07-12 DIAGNOSIS — I10 Essential (primary) hypertension: Secondary | ICD-10-CM

## 2012-07-12 LAB — CBC WITH DIFFERENTIAL/PLATELET
BASO%: 1.2 % (ref 0.0–2.0)
EOS%: 4.5 % (ref 0.0–7.0)
HCT: 40.9 % (ref 38.4–49.9)
LYMPH%: 14.1 % (ref 14.0–49.0)
MCH: 32.8 pg (ref 27.2–33.4)
MCHC: 34.4 g/dL (ref 32.0–36.0)
MONO%: 19.2 % — ABNORMAL HIGH (ref 0.0–14.0)
NEUT%: 61 % (ref 39.0–75.0)
Platelets: 122 10*3/uL — ABNORMAL LOW (ref 140–400)

## 2012-07-12 NOTE — Progress Notes (Signed)
OFFICE PROGRESS NOTE   07/12/2012   Physicians:T.Randall An.Hopper, H.Coley, P.Jordan, D.Grapey, R.Groat   INTERVAL HISTORY:   Patient is seen, alone for visit, in scheduled follow up of recent treatment of hairy cell leukemia, now on observation since completion of weekly cladribine x 6 given from 04-05-12 thru 05-10-12. He continues prophylacticTMP/SMX as recommended by Dr Sharyne Richters, which may be for another few months. He has had viral type URI for past 2-3 days with clear rhinorrhea, but no fever or lower respiratory symptoms.  Oncologic History  Patient was hospitalized from 3-13 thru 04-02-12 at [facility] in [place], after presenting to primary physician Dr Alwyn Ren with cellulitis and shaking chills <24 hours after cat bite on right hand. He was pancytopenic on presentation. Bone marrow exam 03-29-2012 showed hypercellular marrow with B cell lymphoproliferative process, with flow cytometry consistent with hairy cell leukemia. Essentially no aspirate was obtained with that bone marrow exam. Spleen was not enlarged by [place] 03-29-12 (10.3 cm with volume 288.4 ml). The RUE cellulitis progressively improved with IV antibiotics (vanc + zosyn initially, then zosyn alone) and he was otherwise stable in hospital. All cultures were negative. He was followed in hospital by hand surgeon, with I&D x2 of the wound at thumb base. He declined transfer to university hospital although I did discuss the case with attending physicians at 3 university centers. He saw Dr Herbert Moors at [facility] on 04-08-12 in consultation, with agreement concerning treatment plan here. He began cladribine at [facility] on 04-05-12, given weekly x6 instead of single continuous infusion course due to pancytopenia at diagnosis, completed on 05-10-12.He remained pancytopenic thru the cladribine treatment, but had no other infectious complications, no bleeding, and the cat bite has healed. He was given intermittent neupogen thru treatment,  which did improve ANC to ~ 0.5 from ~ 0.2 during that time. Repeat bone marrow exam at [facility] 06-10-12 was consistent with complete remission of the hairy cell leukemia, tho still slightly hypocellular related to the treatment.   URI symptoms as above. Had been feeling well prior to the URI, with appetite back to baseline, good energy, no pain, no fever, no other symptoms of infection, no bleeding. Constipation no longer a problem, not requiring any laxatives now. Remainder of 10 point Review of Systems negative.  Objective:  Vital signs in last 24 hours:  BP 127/74  Pulse 80  Temp(Src) 98 F (36.7 C) (Oral)  Resp 20  Ht 6\' 1"  (1.854 m)  Wt 200 lb 4.8 oz (90.855 kg)  BMI 26.43 kg/m2  Easily mobile, nasally congested and clear rhinorrhea, looks mildly ill.  HEENT:PERRLA, sclera clear, anicteric and oropharynx clear, no lesions . Oral mucosa moist. Nasal turbinates boggy without purulent drainage. Right TM with clear light reflex, left obscured with cerumen. LymphaticsCervical, supraclavicular, and axillary nodes normal. Resp: clear to auscultation bilaterally and normal percussion bilaterally Cardio: regular rate and rhythm GI: soft, non-tender; bowel sounds normal; no masses,  no organomegaly Extremities: extremities normal, atraumatic, no cyanosis or edema Neuro:deafness, chroni  Skin without rash, petechiae, ecchymosis  Lab Results:  Results for orders placed in visit on 07/12/12  CBC WITH DIFFERENTIAL      Result Value Range   WBC 3.3 (*) 4.0 - 10.3 10e3/uL   NEUT# 2.0  1.5 - 6.5 10e3/uL   HGB 14.1  13.0 - 17.1 g/dL   HCT 16.1  09.6 - 04.5 %   Platelets 122 (*) 140 - 400 10e3/uL   MCV 95.4  79.3 - 98.0 fL  MCH 32.8  27.2 - 33.4 pg   MCHC 34.4  32.0 - 36.0 g/dL   RBC 2.95  6.21 - 3.08 10e6/uL   RDW 15.6 (*) 11.0 - 14.6 %   lymph# 0.5 (*) 0.9 - 3.3 10e3/uL   MONO# 0.6  0.1 - 0.9 10e3/uL   Eosinophils Absolute 0.1  0.0 - 0.5 10e3/uL   Basophils Absolute 0.0  0.0 - 0.1  10e3/uL   NEUT% 61.0  39.0 - 75.0 %   LYMPH% 14.1  14.0 - 49.0 %   MONO% 19.2 (*) 0.0 - 14.0 %   EOS% 4.5  0.0 - 7.0 %   BASO% 1.2  0.0 - 2.0 %    HBG is up from 12.5 and platelets up from 117 on 06-09-12; WBC was 3.5 with ANC 2.5 on that date, may be slightly lower with viral URI  Studies/Results:  No results found.  Medications: I have reviewed the patient's current medications.  Assessment/Plan:  1.Hairy cell leukemia: weekly cladribine x 6 completed 05-10-12, with overall improvement in counts including resolution of anemia now. He will be followed now by Dr Sharyne Richters at [facility], tho certainly this office is glad to assist if needed in future. 2.CAD with MI and post CABG x4, HTN. Followed by Dr Sw[place]iland  3. Hx prostate cancer post radical prostatectomy 2008, last PSA undetectable per Dr Isabel Caprice  4.daily ETOH prior to hairy cell leukemia diagnosis, tho he did not drink any alcohol during treatment  5.hx GERD, Raynaud's, tobacco DCd 2006 6.viral URI: he will try benadryl for rhinorrhea and continue to push fluids. If he develops fever, purulent drainage or productive cough he may need antibiotics in addition to the 3x weekly TMP/SMX.    Patient has had questions answered to his satisfaction and is in agreement with plan. He expresses appreciation for care.    LIVESAY,LENNIS P, MD   07/12/2012, 3:37 PM

## 2012-07-13 ENCOUNTER — Telehealth: Payer: Self-pay | Admitting: Oncology

## 2012-07-13 NOTE — Telephone Encounter (Signed)
Faxs labs to Dr. Herbert Moors office  from Dr. Darrold Span.

## 2012-07-20 ENCOUNTER — Emergency Department (HOSPITAL_COMMUNITY)
Admission: EM | Admit: 2012-07-20 | Discharge: 2012-07-20 | Disposition: A | Discharge status: Home / Self Care | Payer: Medicare Other | Attending: Emergency Medicine | Admitting: Emergency Medicine

## 2012-07-20 ENCOUNTER — Encounter (HOSPITAL_COMMUNITY): Payer: Self-pay

## 2012-07-20 DIAGNOSIS — Z951 Presence of aortocoronary bypass graft: Secondary | ICD-10-CM | POA: Insufficient documentation

## 2012-07-20 DIAGNOSIS — E785 Hyperlipidemia, unspecified: Secondary | ICD-10-CM | POA: Insufficient documentation

## 2012-07-20 DIAGNOSIS — Z87448 Personal history of other diseases of urinary system: Secondary | ICD-10-CM | POA: Insufficient documentation

## 2012-07-20 DIAGNOSIS — Z8679 Personal history of other diseases of the circulatory system: Secondary | ICD-10-CM | POA: Insufficient documentation

## 2012-07-20 DIAGNOSIS — I252 Old myocardial infarction: Secondary | ICD-10-CM | POA: Insufficient documentation

## 2012-07-20 DIAGNOSIS — Z8546 Personal history of malignant neoplasm of prostate: Secondary | ICD-10-CM | POA: Insufficient documentation

## 2012-07-20 DIAGNOSIS — K30 Functional dyspepsia: Secondary | ICD-10-CM

## 2012-07-20 DIAGNOSIS — K3189 Other diseases of stomach and duodenum: Secondary | ICD-10-CM

## 2012-07-20 DIAGNOSIS — R531 Weakness: Secondary | ICD-10-CM

## 2012-07-20 DIAGNOSIS — K219 Gastro-esophageal reflux disease without esophagitis: Secondary | ICD-10-CM | POA: Insufficient documentation

## 2012-07-20 DIAGNOSIS — R7309 Other abnormal glucose: Secondary | ICD-10-CM | POA: Insufficient documentation

## 2012-07-20 DIAGNOSIS — Z87442 Personal history of urinary calculi: Secondary | ICD-10-CM | POA: Insufficient documentation

## 2012-07-20 DIAGNOSIS — R5381 Other malaise: Secondary | ICD-10-CM | POA: Insufficient documentation

## 2012-07-20 DIAGNOSIS — Z79899 Other long term (current) drug therapy: Secondary | ICD-10-CM | POA: Insufficient documentation

## 2012-07-20 DIAGNOSIS — H409 Unspecified glaucoma: Secondary | ICD-10-CM | POA: Insufficient documentation

## 2012-07-20 DIAGNOSIS — E781 Pure hyperglyceridemia: Secondary | ICD-10-CM | POA: Insufficient documentation

## 2012-07-20 DIAGNOSIS — I2510 Atherosclerotic heart disease of native coronary artery without angina pectoris: Secondary | ICD-10-CM

## 2012-07-20 DIAGNOSIS — H3530 Unspecified macular degeneration: Secondary | ICD-10-CM | POA: Insufficient documentation

## 2012-07-20 DIAGNOSIS — R1013 Epigastric pain: Secondary | ICD-10-CM

## 2012-07-20 DIAGNOSIS — R110 Nausea: Secondary | ICD-10-CM | POA: Insufficient documentation

## 2012-07-20 DIAGNOSIS — Z8719 Personal history of other diseases of the digestive system: Secondary | ICD-10-CM | POA: Insufficient documentation

## 2012-07-20 DIAGNOSIS — H269 Unspecified cataract: Secondary | ICD-10-CM | POA: Insufficient documentation

## 2012-07-20 DIAGNOSIS — I10 Essential (primary) hypertension: Secondary | ICD-10-CM | POA: Insufficient documentation

## 2012-07-20 DIAGNOSIS — Z87891 Personal history of nicotine dependence: Secondary | ICD-10-CM | POA: Insufficient documentation

## 2012-07-20 DIAGNOSIS — I7300 Raynaud's syndrome without gangrene: Secondary | ICD-10-CM | POA: Insufficient documentation

## 2012-07-20 DIAGNOSIS — M129 Arthropathy, unspecified: Secondary | ICD-10-CM | POA: Insufficient documentation

## 2012-07-20 LAB — POCT I-STAT TROPONIN I

## 2012-07-20 LAB — CBC
Hemoglobin: 14.2 g/dL (ref 13.0–17.0)
MCH: 32.2 pg (ref 26.0–34.0)
MCHC: 34.9 g/dL (ref 30.0–36.0)

## 2012-07-20 LAB — BASIC METABOLIC PANEL
Calcium: 9.4 mg/dL (ref 8.4–10.5)
Creatinine, Ser: 1.09 mg/dL (ref 0.50–1.35)
GFR calc non Af Amer: 64 mL/min — ABNORMAL LOW (ref >90)
Glucose, Bld: 165 mg/dL — ABNORMAL HIGH (ref 70–99)
Sodium: 135 mEq/L (ref 135–145)

## 2012-07-20 MED ORDER — ASPIRIN 81 MG PO CHEW
324.0000 mg | CHEWABLE_TABLET | Freq: Once | ORAL | Status: AC
  Administered 2012-07-20: 324 mg via ORAL
  Filled 2012-07-20: qty 4

## 2012-07-20 NOTE — ED Notes (Signed)
Mother stated, his Calcium level was 14.5 this morning

## 2012-07-20 NOTE — Discharge Instructions (Signed)
Call Dr.Hopper tomorrow at the office to let her know you're here today. He may want to see you in the office. Tell him you were evaluated by Dr. Ethelda Chick and by Dr Elease Hashimoto. Your blood sugar today was 165 which is mildly elevated.Return if you feel worse for any reason

## 2012-07-20 NOTE — ED Notes (Addendum)
Pt c/o feeling weak, dizziness, diaphoretic, nausea, diarrhea, generalized chest discomfort, and bilateral shoulder pain starting yesterday, pt states the symptoms increased after working in the yard today

## 2012-07-20 NOTE — Consult Note (Signed)
Consult Note   Patient ID: [name] MRN: [id number], DOB/AGE: [id number]   Admit date: 07/20/2012 Date of Consult: 07/20/2012   Primary Physician: [name], MD Primary Cardiologist: P. Swaziland, MD  HPI: [name] is a 78 y.o. 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   He was last seen by Dr. Swaziland in 01/2012. He does have an intolerance to Zocor and Raynaud's phenomenon. ANA, RF unremarkable in 02/2012. Normal ETT Myoview- exercise for 5:01 minutes, no significant ST changes on EKG, no evidence of ischemia on Myoview, EF 61%. He has since followed with oncology regularly for chemotherapy of hairy cell leukokemia. On last 6/30 follow-up, he had been diagnosed with a 2-3 day history of URI.   He reports having a reduced appetite yesterday. He awoke today and felt well. Just before noon, he had gone to the store and worked in his garage. He developed progressive weakness, nausea, fatigue and subsequent upper chest/shoulder pressure. He again lost his appetite. He reports experiencing significant belching. He rested for a while, but his symptoms persisted. He does report a similar constellation of symptoms prior to his MI. He did not attempt NTG use. Denies orthopnea or syncope.   In the ED, EKG reveals TW flattening in the inferior leads, questionable Q waves inferiorly +/- IVCD, no acute ST/T change. Initial trop-I WNL. BMET unremarkable. CBC- PLT 128K, otherwise WNL.   Problem List: Past Medical History  Diagnosis Date  . GERD (gastroesophageal reflux disease)   . Hypertension   . Hyperlipidemia   . Insomnia   . Hypertriglyceridemia   . History of atherosclerotic cardiovascular disease   . Bladder neck contracture   . Stable angina     . History of myocardial infarction 2006   S/P CABG  . S/P CABG x 4 2006  . Coronary artery disease CARDIOLOGIST- DR Nash-  LAST VISIT NOTE 12-20-2010 IN EPIC AND W/ CHART    subendocranial MI; left inter. mamm to LAD, saph vein to diag; saph vein to distal circ; saph vein to 1st obtuse  . Arthritis   . H/O hiatal hernia   . History of kidney stones   . Glaucoma BILATERAL  . Cataract immature BILATERAL  . Frequency of urination   . Urgency of urination   . Nocturia   . History of prostate cancer S/P PROSTATECTOMY-  NO RECURRENCE    FOLLOWED BY DR Isabel Caprice  . Macular degeneration   . Raynaud's phenomenon     Past Surgical History  Procedure Laterality Date  . Debridement tennis elbow  2002    RIGHT; [facility]  . Robot assisted laparoscopic radical prostatectomy  05-11-2006  . Cardiac catheterization  07-30-2006  DR Nash    POST CABG / OCCLUDED DIAGONAL &  FOURTH OBTUSE MARGINAL GRAFTS/ NORMAL LVF/ PATENT LIMA TO LAD & SEPHENOUS VEIN GRAFT TO 1ST OBTUSE MARGINAL   . Cardiac catheterization  04-23-2004    CRITICAL THREE-VESSEL CAD/ PRESERVED LVF  . Laparoscopic cholecystectomy  02-05-2005  . Coronary artery bypass graft  04-25-2004  X4 VESSELS    left inter. mamm to LAD, saph vein to diag; saph vein to distal circ; saph vein to 1st obtuse  . Cystoscopy  04/30/2011    Procedure: CYSTOSCOPY;  Surgeon: Valetta Fuller, MD;  Location: [facility];  Service: Urology;  Laterality: N/A;  30 MINUTES   . Balloon dilation  04/30/2011    Procedure: BALLOON DILATION;  Surgeon: Valetta Fuller, MD;  Location: [facility];  Service: Urology;  Laterality: N/A;     Allergies:  Allergies  Allergen Reactions  . Clindamycin Other (See Comments)    Unknown reaction from childhood    Home Medications: Prior to Admission medications   Medication Sig Start Date End Date Taking? Authorizing Provider  acetaminophen (TYLENOL) 325 MG tablet Take 325 mg by mouth every 6  (six) hours as needed for pain.   Yes Historical Provider, MD  brimonidine (ALPHAGAN) 0.2 % ophthalmic solution Place 1 drop into both eyes 2 (two) times daily.   Yes Historical Provider, MD  DiphenhydrAMINE HCl (BENADRYL PO) Take 1 tablet by mouth every 6 (six) hours as needed (for cold).   Yes Historical Provider, MD  isosorbide mononitrate (IMDUR) 60 MG 24 hr tablet Take 1 tablet (60 mg total) by mouth daily. 09/09/11  Yes Derek M Swaziland, MD  latanoprost (XALATAN) 0.005 % ophthalmic solution Place 1 drop into both eyes at bedtime.    Yes Historical Provider, MD  losartan (COZAAR) 50 MG tablet Take 50 mg by mouth daily.  06/22/12  Yes Historical Provider, MD  metoprolol succinate (TOPROL-XL) 50 MG 24 hr tablet Take 1 tablet (50 mg total) by mouth every evening. 09/09/11  Yes Derek M Swaziland, MD  Multiple Vitamin (MULTIVITAMIN) tablet Take 1 tablet by mouth daily.    Yes Historical Provider, MD  multivitamin-lutein (OCUVITE-LUTEIN) CAPS Take 1 capsule by mouth every evening.   Yes Historical Provider, MD  omeprazole (PRILOSEC) 20 MG capsule Take 20 mg by mouth every evening.    Yes Historical Provider, MD  simvastatin (ZOCOR) 40 MG tablet Take 0.5 tablets (20 mg total) by mouth daily. 03/24/12  Yes Derek M Swaziland, MD  sulfamethoxazole-trimethoprim (BACTRIM,SEPTRA) 400-80 MG per tablet Take 2 tablets by mouth 2 (two) times daily. Takes on Mon, Wed and Fri-while white blood cells are low.   Yes Historical Provider, MD  nitroGLYCERIN (NITROSTAT) 0.4 MG SL tablet Place 1 tablet (0.4 mg total) under the tongue every 5 (five) minutes as needed for chest pain. 12/20/10 01/13/97  Derek M Swaziland, MD    Inpatient Medications:    (Not in a hospital admission)  Family History  Problem Relation Age of Onset  . Heart disease Sister     CABGX 1 sister and stents X1 sister  . Cancer Sister   . Parkinsonism Father   . Diabetes Neg Hx   . Stroke Neg Hx      History   Social History  . Marital Status: Married     Spouse Name: N/A    Number of Children: 2  . Years of Education: N/A   Occupational History  . [employer]    Social History Main Topics  . Smoking status: Former Smoker -- 3 years    Types: Cigarettes, Cigars  . Smokeless tobacco: Never Used     Comment: QUIT SMOKING CIGARETTES 1964 AND QUIT OCCASIONAL CIGAR  2007  . Alcohol Use: 8.4 oz/week    7 Glasses of wine, 7 Shots of liquor per week  . Drug Use: No  . Sexually Active: Not on file   Other Topics Concern  . Not on file   Social History Narrative  . No narrative on file     Review of Systems: General:  positive for fatigue, weakness, negative for chills, fever, night sweats or weight changes.  Cardiovascular: positive for chest pain, negative for dyspnea on exertion, edema, orthopnea, palpitations, paroxysmal nocturnal dyspnea or shortness of breath Dermatological: negative for rash Respiratory: negative for cough or wheezing Urologic: negative for hematuria Abdominal: positive for nausea, belching, anorexia, negative for vomiting, diarrhea, bright red blood per rectum, melena, or hematemesis Neurologic: negative for visual changes, syncope, or dizziness All other systems reviewed and are otherwise negative except as noted above.  Physical Exam: Blood pressure 137/95, pulse 63, temperature 97.7 F (36.5 C), temperature source Oral, resp. rate 12, height 5\' 10"  (1.778 m), weight 90.719 kg (200 lb), SpO2 96.00%.   General:  Well developed, well nourished, in no acute distress. Head:  Normocephalic, atraumatic, sclera non-icteric, no xanthomas, nares are without discharge.  Neck:  Negative for carotid bruits. JVD not elevated. Lungs:  Clear bilaterally to auscultation without wheezes, rales, or rhonchi. Breathing is unlabored. Heart:  RRR with S1 S2. No murmurs, rubs, or gallops appreciated. Abdomen:  Soft, non-tender, non-distended with normoactive bowel sounds. No hepatomegaly. No rebound/guarding. No obvious  abdominal masses. Msk:   Strength and tone appears normal for age. Extremities:  No clubbing, cyanosis or edema.  Distal pedal pulses are 2+ and equal bilaterally. Neuro:  Alert and oriented X 3. Moves all extremities spontaneously. Psych:   Responds to questions appropriately with a normal affect.  Labs: Recent Labs     07/20/12  1445  WBC  4.9  HGB  14.2  HCT  40.7  MCV  92.3  PLT  128*   Recent Labs Lab 07/20/12 1445  NA 135  K 4.0  CL 101  CO2 22  BUN 14  CREATININE 1.09  CALCIUM 9.4  GLUCOSE 165*   Radiology/Studies: No results found.  EKG: NSR,   ASSESSMENT AND PLAN:   78 y.o. 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   1. Weakness, fatigue 2. Bilateral shoulder pain/atypical chest pain 3. GERD 4. CAD s/p CABG x 4  A. Normal ETT Myoview 02/2012 5. Hairy cell leukemia  A. S/p 6 doses of cladribine March-April 2014  B. Previous pancytopenia  C. Persistent chronic thrombocytopenia 6. Raynaud's phenomenon 7. HTN 8. HLD  The patient presents to the ED with generalized symptoms of weakness, fatigue, nausea and atypical chest pain/bilateral shoulder pain. He also notes reduced appetite, nausea and belching c/w reflux symptoms. He has had no antecedent, progressively worsening or exertional chest pain. These symptoms were in some ways similar to his prior MI, thus prompting his presentation. He had completed chemotherapy in late April and is currently under observation from an oncology standpoint. Objectively, EKG reveals no acute changes and initial trop-I WNL. ETT Myoview in 02/2012 was normal, preserved EF and no WMAs. His symptoms are more consistent with GERD/GI etiologies. The patient is adamant on being discharged back home from  the ED. Will plan close follow-up in the outpatient setting. Low suspicion for ACS or coronary vasospasm.    Signed, R. Hurman Horn, PA-C 07/20/2012, 4:52 PM   Attending Note:   The patient was seen and examined.  Agree with assessment and plan as noted above.  Changes made to the above note as needed.  Mr. Posten symptoms sound more GI to  me than cardiac.  He has been taking chemotherapy for the past several months and is now on Septra 3 times a week.  He admits that the septra causes some GI upset.  He had a normal myoview in Feb.  No CP.  He admits that he has not been exercising much because of his chemotherapy treatments.  I would place him at a low risk for a cardiac event.  He will call his primary medical doctor tomorrow.   Vesta Mixer, Montez Hageman., MD, [facility] 07/20/2012, 5:34 PM

## 2012-07-20 NOTE — ED Provider Notes (Signed)
History    CSN: [id number] Arrival date & time 07/20/12  1434  First MD Initiated Contact with Patient 07/20/12 1556     Chief Complaint  Patient presents with  . Weakness   (Consider location/radiation/quality/duration/timing/severity/associated sxs/prior Treatment) HPI  Complains of generalized weakness. Lightheadedness and pain in both shoulders accompanied by intermittent nausea onset today shortly after working on his car. Symptoms feel like "heart attack" he's had in the past. No treatment prior to coming here. He is presently asymptomatic without treatment. He is concerned that maybe his heart. He denies diarrhea but states that he had 2 loose bowel movements today. Denies abdominal pain. Denies other associated symptoms. Past Medical History  Diagnosis Date  . GERD (gastroesophageal reflux disease)   . Hypertension   . Hyperlipidemia   . Insomnia   . Hypertriglyceridemia   . History of atherosclerotic cardiovascular disease   . Bladder neck contracture   . Stable angina   . History of myocardial infarction 2006   S/P CABG  . S/P CABG x 4 2006  . Coronary artery disease CARDIOLOGIST- DR Swaziland-  LAST VISIT NOTE 12-20-2010 IN EPIC AND W/ CHART    subendocranial MI; left inter. mamm to LAD, saph vein to diag; saph vein to distal circ; saph vein to 1st obtuse  . Arthritis   . H/O hiatal hernia   . History of kidney stones   . Glaucoma BILATERAL  . Cataract immature BILATERAL  . Frequency of urination   . Urgency of urination   . Nocturia   . History of prostate cancer S/P PROSTATECTOMY-  NO RECURRENCE    FOLLOWED BY DR Isabel Caprice  . Macular degeneration   . Raynaud's phenomenon    Past Surgical History  Procedure Laterality Date  . Debridement tennis elbow  2002    RIGHT; [facility]  . Robot assisted laparoscopic radical prostatectomy  05-11-2006  . Cardiac catheterization  07-30-2006  DR Swaziland    POST CABG / OCCLUDED DIAGONAL &  FOURTH OBTUSE MARGINAL GRAFTS/ NORMAL  LVF/ PATENT LIMA TO LAD & SEPHENOUS VEIN GRAFT TO 1ST OBTUSE MARGINAL   . Cardiac catheterization  04-23-2004    CRITICAL THREE-VESSEL CAD/ PRESERVED LVF  . Laparoscopic cholecystectomy  02-05-2005  . Coronary artery bypass graft  04-25-2004  X4 VESSELS    left inter. mamm to LAD, saph vein to diag; saph vein to distal circ; saph vein to 1st obtuse  . Cystoscopy  04/30/2011    Procedure: CYSTOSCOPY;  Surgeon: Valetta Fuller, MD;  Location: [facility];  Service: Urology;  Laterality: N/A;  30 MINUTES   . Balloon dilation  04/30/2011    Procedure: BALLOON DILATION;  Surgeon: Valetta Fuller, MD;  Location: [facility];  Service: Urology;  Laterality: N/A;   Family History  Problem Relation Age of Onset  . Heart disease Sister     CABGX 1 sister and stents X1 sister  . Cancer Sister   . Parkinsonism Father   . Diabetes Neg Hx   . Stroke Neg Hx    History  Substance Use Topics  . Smoking status: Former Smoker -- 3 years    Types: Cigarettes, Cigars  . Smokeless tobacco: Never Used     Comment: QUIT SMOKING CIGARETTES 1964 AND QUIT OCCASIONAL CIGAR  2007  . Alcohol Use: 8.4 oz/week    7 Glasses of wine, 7 Shots of liquor per week    Review of Systems  HENT: Negative.   Respiratory:  Negative.   Cardiovascular: Negative.   Gastrointestinal: Positive for nausea.  Musculoskeletal: Negative.   Skin: Negative.   Allergic/Immunologic: Positive for immunocompromised state.       Treated for leukemia.  Neurological: Positive for weakness.  Psychiatric/Behavioral: Negative.   All other systems reviewed and are negative.    Allergies  Clindamycin  Home Medications   Current Outpatient Rx  Name  Route  Sig  Dispense  Refill  . brimonidine (ALPHAGAN) 0.2 % ophthalmic solution   Both Eyes   Place 1 drop into both eyes 2 (two) times daily.         [name] docusate sodium (COLACE) 100 MG capsule   Oral   Take 100 mg by mouth 2 (two) times daily.          . isosorbide mononitrate (IMDUR) 60 MG 24 hr tablet   Oral   Take 1 tablet (60 mg total) by mouth daily.   90 tablet   3   . latanoprost (XALATAN) 0.005 % ophthalmic solution   Both Eyes   Place 1 drop into both eyes at bedtime.          [name] losartan (COZAAR) 50 MG tablet               . metoprolol succinate (TOPROL-XL) 50 MG 24 hr tablet   Oral   Take 1 tablet (50 mg total) by mouth every evening.   90 tablet   3   . Multiple Vitamin (MULTIVITAMIN) tablet   Oral   Take 1 tablet by mouth daily.          . nitroGLYCERIN (NITROSTAT) 0.4 MG SL tablet   Sublingual   Place 1 tablet (0.4 mg total) under the tongue every 5 (five) minutes as needed for chest pain.   100 tablet   3   . omeprazole (PRILOSEC) 20 MG capsule   Oral   Take 20 mg by mouth every morning.          . sennosides-docusate sodium (SENOKOT-S) 8.6-50 MG tablet   Oral   Take 1-2 tablets by mouth 2 (two) times daily as needed for constipation.         . simvastatin (ZOCOR) 40 MG tablet   Oral   Take 0.5 tablets (20 mg total) by mouth daily.   30 tablet   6   . sulfamethoxazole-trimethoprim (BACTRIM,SEPTRA) 400-80 MG per tablet      TAKE 2 TABLETS TWICE A DAY ON M-W-F WHILE WHITE BLOOD CELLS ARE LOW   48 tablet   4    BP 141/91  Pulse 65  Temp(Src) 97.9 F (36.6 C) (Oral)  Resp 18  Ht 5\' 10"  (1.778 m)  Wt 200 lb (90.719 kg)  BMI 28.7 kg/m2  SpO2 96% Physical Exam  Nursing note and vitals reviewed. Constitutional: He is oriented to person, place, and time. He appears well-developed and well-nourished.  HENT:  Head: Normocephalic and atraumatic.  Eyes: Conjunctivae are normal. Pupils are equal, round, and reactive to light.  Neck: Neck supple. No tracheal deviation present. No thyromegaly present.  Cardiovascular: Normal rate and regular rhythm.   No murmur heard. Pulmonary/Chest: Effort normal and breath sounds normal.  Abdominal: Soft. Bowel sounds are normal. He exhibits no  distension. There is no tenderness.  Musculoskeletal: Normal range of motion. He exhibits no edema and no tenderness.  Neurological: He is alert and oriented to person, place, and time. Coordination normal.  Skin: Skin is warm and dry. No rash noted.  Psychiatric: He has a normal mood and affect.    ED Course  Procedures (including critical care time) Labs Reviewed  CBC - Abnormal; Notable for the following:    Platelets 128 (*)    All other components within normal limits  BASIC METABOLIC PANEL - Abnormal; Notable for the following:    Glucose, Bld 165 (*)    GFR calc non Af Amer 64 (*)    GFR calc Af Amer 74 (*)    All other components within normal limits  POCT I-STAT TROPONIN I   No results found. No diagnosis found.  Date: 07/20/2012  Rate: 75  Rhythm: normal sinus rhythm  QRS Axis: normal  Intervals: normal  ST/T Wave abnormalities: normal  Conduction Disutrbances:none  Narrative Interpretation:   Old EKG Reviewed: Unchanged from 06/18/2011 interpreted by me Dr. Elease Hashimoto evaluated in patient in the emergency department and feels that he is safe for discharge to home.He doie not feel symptoms not felt to be cardiac in etiology. Can followup with Dr. Alwyn Ren as outpatient oral of our cardiology. Results for orders placed during the hospital encounter of 07/20/12  CBC      Result Value Range   WBC 4.9  4.0 - 10.5 K/uL   RBC 4.41  4.22 - 5.81 MIL/uL   Hemoglobin 14.2  13.0 - 17.0 g/dL   HCT 91.4  78.2 - 95.6 %   MCV 92.3  78.0 - 100.0 fL   MCH 32.2  26.0 - 34.0 pg   MCHC 34.9  30.0 - 36.0 g/dL   RDW 21.3  08.6 - 57.8 %   Platelets 128 (*) 150 - 400 K/uL  BASIC METABOLIC PANEL      Result Value Range   Sodium 135  135 - 145 mEq/L   Potassium 4.0  3.5 - 5.1 mEq/L   Chloride 101  96 - 112 mEq/L   CO2 22  19 - 32 mEq/L   Glucose, Bld 165 (*) 70 - 99 mg/dL   BUN 14  6 - 23 mg/dL   Creatinine, Ser 4.69  0.50 - 1.35 mg/dL   Calcium 9.4  8.4 - 62.9 mg/dL   GFR calc non Af  Amer 64 (*) >90 mL/min   GFR calc Af Amer 74 (*) >90 mL/min  POCT I-STAT TROPONIN I      Result Value Range   Troponin i, poc 0.02  0.00 - 0.08 ng/mL   Comment 3            No results found.  MDM  Patient wishes to go home and agrees with decision. Diagnosis #1weakness #2 hyperglycemia  Doug Sou, MD 07/20/12 1730

## 2012-07-21 ENCOUNTER — Encounter: Payer: Self-pay | Admitting: Internal Medicine

## 2012-07-21 ENCOUNTER — Ambulatory Visit (INDEPENDENT_AMBULATORY_CARE_PROVIDER_SITE_OTHER): Payer: Medicare Other | Admitting: Internal Medicine

## 2012-07-21 VITALS — BP 140/88 | HR 68 | Temp 98.0°F | Wt 200.0 lb

## 2012-07-21 DIAGNOSIS — K589 Irritable bowel syndrome without diarrhea: Secondary | ICD-10-CM

## 2012-07-21 DIAGNOSIS — K219 Gastro-esophageal reflux disease without esophagitis: Secondary | ICD-10-CM

## 2012-07-21 NOTE — Progress Notes (Signed)
  Subjective:    Patient ID: [name], male    DOB: 05/14/1934, 78 y.o.   MRN: [id number]  HPI   The emergency room records 07/20/12 were reviewed. He was seen with lightheadedness, bilateral shoulder pain, and nausea reminiscent of symptoms he had with his heart attack.  His troponin was negative for myocardial injury and he was cleared by cardiologist. That note was also reviewed  Nonfasting glucose was 165; platelet count 1 28,000; and GFR 64.  Significantly his last hemoglobin A1c was 5.9% 05/26/12.  He was given 3 baby aspirin in the emergency room but threw these up after he got home. He continued to have almost constant nausea despite taking omeprazole each evening.  He's been on generic sulfa 3 times a week from his oncologist who is treating his hairy cell leukemia; he states that this also causes nausea. He's had some nausea with his multivitamin as well    Review of Systems  He's had some abdominal tenderness but no definite pain. He's actually been gaining weight. He denies associated hoarseness, dysphagia, melena, rectal bleeding.  He was placed on a stool softener by an oncologist but this caused loose to watery stool. Off the stool softener he had constipation; he takes it now 3 times a week.     Objective:   Physical Exam General appearance is one of good  nourishment w/o distress.  Eyes: No conjunctival inflammation or scleral icterus is present.  Oral exam: Dental hygiene is good; lips and gums are healthy appearing.There is uvular & oropharyngeal erythema w/o exudate.   Heart:  Normal rate and regular rhythm. S1 and S2 normal without gallop, click, rub or other extra sounds  .Grade 1/2 over 6 systolic murmur   Lungs:Chest clear to auscultation; no wheezes, rhonchi,rales ,or rubs present.No increased work of breathing.   Abdomen: bowel sounds normal, soft but diffusely tender without masses, organomegaly or hernias noted.  No guarding or rebound   Skin:Warm  & dry.  Intact without suspicious lesions or rashes ; no jaundice or tenting  Lymphatic: No lymphadenopathy is noted about the head, neck, axilla.             Assessment & Plan:  #1 GERD #2 IBS See Plan

## 2012-07-21 NOTE — Patient Instructions (Addendum)
Reflux of gastric acid may be asymptomatic as this may occur mainly during sleep.The triggers for reflux  include stress; the "aspirin family" ; alcohol; peppermint; and caffeine (coffee, tea, cola, and chocolate). The aspirin family would include aspirin and the nonsteroidal agents such as ibuprofen &  Naproxen. Tylenol would not cause reflux. If having symptoms ; food & drink should be avoided for @ least 2 hours before going to bed.  Please take the Omeprazole 30 minutes before breakfast. Please take the probiotic , Align, every day until the bowels are normal. This will replace the normal bacteria which  are necessary for formation of normal stool and processing of food.

## 2012-07-26 ENCOUNTER — Other Ambulatory Visit: Payer: Self-pay | Admitting: *Deleted

## 2012-07-26 ENCOUNTER — Telehealth: Payer: Self-pay | Admitting: *Deleted

## 2012-07-26 DIAGNOSIS — I10 Essential (primary) hypertension: Secondary | ICD-10-CM

## 2012-07-26 MED ORDER — LOSARTAN POTASSIUM 50 MG PO TABS: 50.0000 mg | ORAL_TABLET | Freq: Every day | ORAL | Status: DC

## 2012-07-26 NOTE — Telephone Encounter (Signed)
Returning patient's call about medication refill.  Refill for lorsartan 50mg  was sent #3 with 3RF. I resent to rx for #30 3RF and apologized for any inconvenience it may have caused. He said ok.   Micki Riley, CMA

## 2012-08-05 ENCOUNTER — Other Ambulatory Visit: Payer: Self-pay | Admitting: *Deleted

## 2012-08-05 DIAGNOSIS — I10 Essential (primary) hypertension: Secondary | ICD-10-CM

## 2012-08-05 MED ORDER — LOSARTAN POTASSIUM 50 MG PO TABS: 50.0000 mg | ORAL_TABLET | Freq: Every day | ORAL | Status: DC

## 2012-08-13 ENCOUNTER — Encounter: Payer: Self-pay | Admitting: Cardiology

## 2012-08-13 ENCOUNTER — Ambulatory Visit (INDEPENDENT_AMBULATORY_CARE_PROVIDER_SITE_OTHER): Payer: Medicare Other | Admitting: Cardiology

## 2012-08-13 VITALS — BP 140/84 | HR 72 | Ht 70.0 in | Wt 205.0 lb

## 2012-08-13 DIAGNOSIS — E785 Hyperlipidemia, unspecified: Secondary | ICD-10-CM

## 2012-08-13 DIAGNOSIS — I2510 Atherosclerotic heart disease of native coronary artery without angina pectoris: Secondary | ICD-10-CM

## 2012-08-13 DIAGNOSIS — I10 Essential (primary) hypertension: Secondary | ICD-10-CM

## 2012-08-13 MED ORDER — SIMVASTATIN 20 MG PO TABS: 20.0000 mg | ORAL_TABLET | Freq: Every evening | ORAL | Status: DC

## 2012-08-13 MED ORDER — METOPROLOL SUCCINATE ER 50 MG PO TB24: 50.0000 mg | ORAL_TABLET | Freq: Every evening | ORAL | Status: DC

## 2012-08-13 MED ORDER — LOSARTAN POTASSIUM 50 MG PO TABS: 50.0000 mg | ORAL_TABLET | Freq: Every day | ORAL | Status: DC

## 2012-08-13 MED ORDER — OMEPRAZOLE 20 MG PO CPDR: 20.0000 mg | DELAYED_RELEASE_CAPSULE | Freq: Every evening | ORAL | Status: DC

## 2012-08-13 MED ORDER — ISOSORBIDE MONONITRATE ER 60 MG PO TB24: 60.0000 mg | ORAL_TABLET | Freq: Every day | ORAL | Status: DC

## 2012-08-13 NOTE — Patient Instructions (Signed)
Continue your current therapy  I will see you in 6 months.   

## 2012-08-13 NOTE — Progress Notes (Signed)
Darl Pikes Date of Birth: [birth year]   History of Present Illness: Mr. Borsuk is seen today for followup. He has a history of coronary disease and is status post CABG in 2006.  He had a normal stress Myoview study in February of this year. Since his last visit here he was diagnosed with hairy cell leukemia. He was treated with Cladribine at [facility]. He has had a good response. Approximately 2 weeks ago he did have an episode of chest pain it felt more like indigestion. He went to the emergency department. His evaluation there was unremarkable. His appetite is good. His blood counts are improving.  Current Outpatient Prescriptions on File Prior to Visit  Medication Sig Dispense Refill  . acetaminophen (TYLENOL) 325 MG tablet Take 325 mg by mouth every 6 (six) hours as needed for pain.      . brimonidine (ALPHAGAN) 0.2 % ophthalmic solution Place 1 drop into both eyes 2 (two) times daily.      . DiphenhydrAMINE HCl (BENADRYL PO) Take 1 tablet by mouth every 6 (six) hours as needed (for cold).      [name] latanoprost (XALATAN) 0.005 % ophthalmic solution Place 1 drop into both eyes at bedtime.       . multivitamin-lutein (OCUVITE-LUTEIN) CAPS Take 1 capsule by mouth every evening.      . nitroGLYCERIN (NITROSTAT) 0.4 MG SL tablet Place 1 tablet (0.4 mg total) under the tongue every 5 (five) minutes as needed for chest pain.  100 tablet  3  . sulfamethoxazole-trimethoprim (BACTRIM,SEPTRA) 400-80 MG per tablet Take 2 tablets by mouth 2 (two) times daily. Takes on Mon, Wed and Fri-while white blood cells are low.       No current facility-administered medications on file prior to visit.    Allergies  Allergen Reactions  . Clindamycin Other (See Comments)    Unknown reaction from childhood    Past Medical History  Diagnosis Date  . GERD (gastroesophageal reflux disease)   . Hypertension   . Hyperlipidemia   . Insomnia   . Hypertriglyceridemia   . History of  atherosclerotic cardiovascular disease   . Bladder neck contracture   . Stable angina   . History of myocardial infarction 2006   S/P CABG  . S/P CABG x 4 2006  . Coronary artery disease CARDIOLOGIST- DR [place]-  LAST VISIT NOTE 12-20-2010 IN EPIC AND W/ CHART    subendocranial MI; left inter. mamm to LAD, saph vein to diag; saph vein to distal circ; saph vein to 1st obtuse  . Arthritis   . H/O hiatal hernia   . History of kidney stones   . Glaucoma BILATERAL  . Cataract immature BILATERAL  . Frequency of urination   . Urgency of urination   . Nocturia   . History of prostate cancer S/P PROSTATECTOMY-  NO RECURRENCE    FOLLOWED BY DR Isabel Caprice  . Macular degeneration   . Raynaud's phenomenon   . Right arm cellulitis 04/02/12    post cat bite  . Hairy cell leukemia     Past Surgical History  Procedure Laterality Date  . Debridement tennis elbow  2002    RIGHT; [facility]  . Robot assisted laparoscopic radical prostatectomy  05-11-2006  . Cardiac catheterization  07-30-2006  DR [place]    POST CABG / OCCLUDED DIAGONAL &  FOURTH OBTUSE MARGINAL GRAFTS/ NORMAL LVF/ PATENT LIMA TO LAD & SEPHENOUS VEIN GRAFT TO 1ST OBTUSE MARGINAL   . Cardiac  catheterization  04-23-2004    CRITICAL THREE-VESSEL CAD/ PRESERVED LVF  . Laparoscopic cholecystectomy  02-05-2005  . Coronary artery bypass graft  04-25-2004  X4 VESSELS    left inter. mamm to LAD, saph vein to diag; saph vein to distal circ; saph vein to 1st obtuse  . Cystoscopy  04/30/2011    Procedure: CYSTOSCOPY;  Surgeon: Valetta Fuller, MD;  Location: [facility];  Service: Urology;  Laterality: N/A;  30 MINUTES   . Balloon dilation  04/30/2011    Procedure: BALLOON DILATION;  Surgeon: Valetta Fuller, MD;  Location: [facility];  Service: Urology;  Laterality: N/A;    History  Smoking status  . Former Smoker -- 3 years  . Types: Cigarettes, Cigars  Smokeless tobacco  . Never Used    Comment: QUIT SMOKING  CIGARETTES 1964 AND QUIT OCCASIONAL CIGAR  2007    History  Alcohol Use  . 8.4 oz/week  . 7 Glasses of wine, 7 Shots of liquor per week    Family History  Problem Relation Age of Onset  . Heart disease Sister     CABGX 1 sister and stents X1 sister  . Cancer Sister   . Parkinsonism Father   . Diabetes Neg Hx   . Stroke Neg Hx     Review of Systems: As noted in history of present illness. All other systems were reviewed and are negative.  Physical Exam: BP 140/84  Pulse 72  Ht 5\' 10"  (1.778 m)  Wt 205 lb (92.987 kg)  BMI 29.41 kg/m2  SpO2 95% He is a pleasant white male in no acute distress. HEENT normocephalic, atraumatic. Pupils are equal round and reactive to light. Oropharynx is clear. Neck is supple without JVD, adenopathy, thyromegaly, or bruits. Lungs are clear. Cardiac exam reveals a regular rate and rhythm without gallop or murmur. His abdomen is soft and nontender. He has no edema. Pedal pulses are good. Radial pulses are good. He does have bluish discoloration of his fingertips and they're cool to touch. He is alert oriented x3. Cranial nerves II through XII are intact. LABORATORY DATA: Lab Results  Component Value Date   WBC 4.9 07/20/2012   HGB 14.2 07/20/2012   HCT 40.7 07/20/2012   PLT 128* 07/20/2012   GLUCOSE 165* 07/20/2012   CHOL 115 02/17/2012   TRIG 121.0 02/17/2012   HDL 36.80* 02/17/2012   LDLCALC 54 02/17/2012   ALT 15 05/07/2012   AST 15 05/07/2012   NA 135 07/20/2012   K 4.0 07/20/2012   CL 101 07/20/2012   CREATININE 1.09 07/20/2012   BUN 14 07/20/2012   CO2 22 07/20/2012   TSH 0.559 03/26/2012   INR 1.19 03/29/2012   HGBA1C 5.9* 05/26/2012     Assessment / Plan: 1. Coronary disease status post CABG. Clinically without significant symptoms. Myoview study in February of this year was normal. Continue aspirin, isosorbide, and metoprolol.  2. Hypertension. Blood pressure is well controlled.   3. Hyperlipidemia.   4. Raynaud's phenomenon.   5..Hairy cell leukemia  with pancytopenia. Clinically improved.

## 2012-10-03 ENCOUNTER — Other Ambulatory Visit: Payer: Self-pay | Admitting: Cardiology

## 2012-10-15 ENCOUNTER — Other Ambulatory Visit: Payer: Self-pay | Admitting: Cardiology

## 2012-11-18 ENCOUNTER — Other Ambulatory Visit: Payer: Self-pay

## 2012-11-20 ENCOUNTER — Other Ambulatory Visit: Payer: Self-pay | Admitting: Oncology

## 2012-11-20 DIAGNOSIS — C9140 Hairy cell leukemia not having achieved remission: Secondary | ICD-10-CM

## 2012-11-25 ENCOUNTER — Telehealth: Payer: Self-pay

## 2012-11-25 ENCOUNTER — Other Ambulatory Visit: Payer: Self-pay | Admitting: Oncology

## 2012-11-25 NOTE — Telephone Encounter (Signed)
Sent refusal to patient's pharmacy for medlisted below and stated tat Dr. Sharyne Richters at [facility] was patient's physician.

## 2012-11-25 NOTE — Telephone Encounter (Signed)
Message copied by Lorine Bears on Thu Nov 25, 2012  4:49 PM ------      Message from: Jama Flavors P      Created: Thu Nov 25, 2012  3:15 PM       Refill request to my inbox for TMP SMX.       He is now followed at [facility], so any prescriptions should be from that MD.             Cc LA, TH, NW ------

## 2012-12-25 ENCOUNTER — Other Ambulatory Visit: Payer: Self-pay | Admitting: Cardiology

## 2013-05-04 ENCOUNTER — Encounter: Payer: Self-pay | Admitting: Cardiology

## 2013-06-20 ENCOUNTER — Other Ambulatory Visit: Payer: Self-pay

## 2013-06-20 ENCOUNTER — Ambulatory Visit (INDEPENDENT_AMBULATORY_CARE_PROVIDER_SITE_OTHER): Payer: Medicare Other | Admitting: Cardiology

## 2013-06-20 ENCOUNTER — Encounter: Payer: Self-pay | Admitting: Cardiology

## 2013-06-20 VITALS — BP 142/84 | HR 71 | Ht 70.0 in | Wt 211.6 lb

## 2013-06-20 DIAGNOSIS — E785 Hyperlipidemia, unspecified: Secondary | ICD-10-CM

## 2013-06-20 DIAGNOSIS — I10 Essential (primary) hypertension: Secondary | ICD-10-CM

## 2013-06-20 DIAGNOSIS — I2510 Atherosclerotic heart disease of native coronary artery without angina pectoris: Secondary | ICD-10-CM

## 2013-06-20 MED ORDER — SIMVASTATIN 20 MG PO TABS: 20.0000 mg | ORAL_TABLET | Freq: Every evening | ORAL | Status: DC

## 2013-06-20 MED ORDER — METOPROLOL SUCCINATE ER 50 MG PO TB24: 50.0000 mg | ORAL_TABLET | Freq: Every evening | ORAL | Status: DC

## 2013-06-20 MED ORDER — LOSARTAN POTASSIUM 50 MG PO TABS: 50.0000 mg | ORAL_TABLET | Freq: Every day | ORAL | Status: DC

## 2013-06-20 MED ORDER — ISOSORBIDE MONONITRATE ER 60 MG PO TB24: 60.0000 mg | ORAL_TABLET | Freq: Every day | ORAL | Status: DC

## 2013-06-20 NOTE — Progress Notes (Signed)
[name] Date of Birth: 04-May-1934   History of Present Illness: [name] is seen today for followup. He has a history of coronary disease and is status post CABG in 2006.  He had a normal stress Myoview study in February 2014. He has a history of  hairy cell leukemia. He was treated with Cladribine at [facility]. Now off chemotherapy. He denies any symptoms of chest pain or SOB. He has chronic insomnia and feels tired a lot. He is not getting any exercise. Since his cancer diagnosis he feels like he is under a black cloud. He no longer has Raynaud's phenomena.  Current Outpatient Prescriptions on File Prior to Visit  Medication Sig Dispense Refill  . acetaminophen (TYLENOL) 325 MG tablet Take 325 mg by mouth every 6 (six) hours as needed for pain.      [name] FOLIC ACID PO Take by mouth.      . isosorbide mononitrate (IMDUR) 60 MG 24 hr tablet Take 1 tablet (60 mg total) by mouth daily.  90 tablet  3  . latanoprost (XALATAN) 0.005 % ophthalmic solution Place 1 drop into both eyes at bedtime.       [name] losartan (COZAAR) 50 MG tablet Take 1 tablet (50 mg total) by mouth daily.  90 tablet  3  . metoprolol succinate (TOPROL-XL) 50 MG 24 hr tablet Take 1 tablet (50 mg total) by mouth every evening.  90 tablet  3  . multivitamin-lutein (OCUVITE-LUTEIN) CAPS Take 1 capsule by mouth every evening.      . nitroGLYCERIN (NITROSTAT) 0.4 MG SL tablet Place 1 tablet (0.4 mg total) under the tongue every 5 (five) minutes as needed for chest pain.  100 tablet  3  . omeprazole (PRILOSEC) 20 MG capsule Take 1 capsule (20 mg total) by mouth every evening.  90 capsule  3  . simvastatin (ZOCOR) 20 MG tablet Take 1 tablet (20 mg total) by mouth every evening.  90 tablet  3   No current facility-administered medications on file prior to visit.    Allergies  Allergen Reactions  . Clindamycin Other (See Comments)    Unknown reaction from childhood    Past Medical History  Diagnosis  Date  . GERD (gastroesophageal reflux disease)   . Hypertension   . Hyperlipidemia   . Insomnia   . Hypertriglyceridemia   . History of atherosclerotic cardiovascular disease   . Bladder neck contracture   . Stable angina   . History of myocardial infarction 2006   S/P CABG  . S/P CABG x 4 2006  . Coronary artery disease CARDIOLOGIST- DR [place]-  LAST VISIT NOTE 12-20-2010 IN EPIC AND W/ CHART    subendocranial MI; left inter. mamm to LAD, saph vein to diag; saph vein to distal circ; saph vein to 1st obtuse  . Arthritis   . H/O hiatal hernia   . History of kidney stones   . Glaucoma BILATERAL  . Cataract immature BILATERAL  . Frequency of urination   . Urgency of urination   . Nocturia   . History of prostate cancer S/P PROSTATECTOMY-  NO RECURRENCE    FOLLOWED BY DR Risa Grill  . Macular degeneration   . Raynaud's phenomenon   . Right arm cellulitis 04/02/12    post cat bite  . Hairy cell leukemia     Past Surgical History  Procedure Laterality Date  . Debridement tennis elbow  2002    RIGHT; [facility]  . Robot  assisted laparoscopic radical prostatectomy  05-11-2006  . Cardiac catheterization  07-30-2006  DR [place]    POST CABG / OCCLUDED DIAGONAL &  FOURTH OBTUSE MARGINAL GRAFTS/ NORMAL LVF/ PATENT LIMA TO LAD & SEPHENOUS VEIN GRAFT TO 1ST OBTUSE MARGINAL   . Cardiac catheterization  04-23-2004    CRITICAL THREE-VESSEL CAD/ PRESERVED LVF  . Laparoscopic cholecystectomy  02-05-2005  . Coronary artery bypass graft  04-25-2004  X4 VESSELS    left inter. mamm to LAD, saph vein to diag; saph vein to distal circ; saph vein to 1st obtuse  . Cystoscopy  04/30/2011    Procedure: CYSTOSCOPY;  Surgeon: Bernestine Amass, MD;  Location: [facility];  Service: Urology;  Laterality: N/A;  30 MINUTES   . Balloon dilation  04/30/2011    Procedure: BALLOON DILATION;  Surgeon: Bernestine Amass, MD;  Location: [facility];  Service: Urology;  Laterality: N/A;     History  Smoking status  . Former Smoker -- 3 years  . Types: Cigarettes, Cigars  Smokeless tobacco  . Never Used    Comment: QUIT SMOKING CIGARETTES 1964 AND QUIT OCCASIONAL CIGAR  2007    History  Alcohol Use  . 8.4 oz/week  . 7 Glasses of wine, 7 Shots of liquor per week    Family History  Problem Relation Age of Onset  . Heart disease Sister     CABGX 1 sister and stents X1 sister  . Cancer Sister   . Parkinsonism Father   . Diabetes Neg Hx   . Stroke Neg Hx     Review of Systems: As noted in history of present illness. All other systems were reviewed and are negative.  Physical Exam: BP 142/84  Pulse 71  Ht 5\' 10"  (1.778 m)  Wt 211 lb 9.6 oz (95.981 kg)  BMI 30.36 kg/m2  SpO2 95% He is a pleasant white male in no acute distress. HEENT normocephalic, atraumatic. Pupils are equal round and reactive to light. Oropharynx is clear. Neck is supple without JVD, adenopathy, thyromegaly, or bruits. Lungs are clear. Cardiac exam reveals a regular rate and rhythm without gallop or murmur. His abdomen is soft and nontender. He has no edema. Pedal pulses are good.  He is alert oriented x3. Cranial nerves II through XII are intact.  LABORATORY DATA: Lab Results  Component Value Date   WBC 4.9 07/20/2012   HGB 14.2 07/20/2012   HCT 40.7 07/20/2012   PLT 128* 07/20/2012   GLUCOSE 165* 07/20/2012   CHOL 115 02/17/2012   TRIG 121.0 02/17/2012   HDL 36.80* 02/17/2012   LDLCALC 54 02/17/2012   ALT 15 05/07/2012   AST 15 05/07/2012   NA 135 07/20/2012   K 4.0 07/20/2012   CL 101 07/20/2012   CREATININE 1.09 07/20/2012   BUN 14 07/20/2012   CO2 22 07/20/2012   TSH 0.559 03/26/2012   INR 1.19 03/29/2012   HGBA1C 5.9* 05/26/2012     Assessment / Plan: 1. Coronary disease status post CABG. Clinically without significant symptoms. Myoview study in February 2014was normal. Continue aspirin, isosorbide, and metoprolol. I have encouraged him to get regular aerobic exercise.   2. Hypertension. Blood  pressure is well controlled.   3. Hyperlipidemia.   4. Hairy cell leukemia. In remission.  5. Raynaud's phenomenon. Resolved.

## 2013-06-20 NOTE — Patient Instructions (Signed)
Continue your therapy  I will see you in 6 months.  You need to get regular aerobic exercise

## 2013-09-21 ENCOUNTER — Other Ambulatory Visit: Payer: Self-pay | Admitting: Cardiology

## 2013-11-14 ENCOUNTER — Encounter: Payer: Self-pay | Admitting: Cardiology

## 2013-11-14 ENCOUNTER — Other Ambulatory Visit: Payer: Self-pay

## 2013-11-14 DIAGNOSIS — E785 Hyperlipidemia, unspecified: Secondary | ICD-10-CM

## 2013-11-16 NOTE — Telephone Encounter (Signed)
Received a call from patient he stated he would like to have fasting lab before he sees Dr.Jordan 12/20/13.Lab orders mailed to patient.Advised to have done at [place] 1 week before appointment.

## 2013-11-16 NOTE — Telephone Encounter (Signed)
Patient called stated he will fax labs done at [place] at [facility].

## 2013-12-16 LAB — LIPID PANEL
CHOL/HDL RATIO: 3.2 ratio
CHOLESTEROL: 187 mg/dL (ref 0–200)
HDL: 58 mg/dL (ref >39)
LDL CALC: 96 mg/dL (ref 0–99)
TRIGLYCERIDES: 164 mg/dL — AB (ref <150)
VLDL: 33 mg/dL (ref 0–40)

## 2013-12-16 LAB — BASIC METABOLIC PANEL
BUN: 14 mg/dL (ref 6–23)
CHLORIDE: 103 meq/L (ref 96–112)
CO2: 26 mEq/L (ref 19–32)
Calcium: 9.4 mg/dL (ref 8.4–10.5)
Creat: 1.08 mg/dL (ref 0.50–1.35)
Glucose, Bld: 131 mg/dL — ABNORMAL HIGH (ref 70–99)
POTASSIUM: 4.5 meq/L (ref 3.5–5.3)
SODIUM: 138 meq/L (ref 135–145)

## 2013-12-16 LAB — HEPATIC FUNCTION PANEL
ALT: 59 U/L — ABNORMAL HIGH (ref 0–53)
AST: 38 U/L — AB (ref 0–37)
Albumin: 4.1 g/dL (ref 3.5–5.2)
Alkaline Phosphatase: 45 U/L (ref 39–117)
BILIRUBIN DIRECT: 0.2 mg/dL (ref 0.0–0.3)
BILIRUBIN TOTAL: 0.7 mg/dL (ref 0.2–1.2)
Indirect Bilirubin: 0.5 mg/dL (ref 0.2–1.2)
Total Protein: 6.6 g/dL (ref 6.0–8.3)

## 2013-12-20 ENCOUNTER — Ambulatory Visit (INDEPENDENT_AMBULATORY_CARE_PROVIDER_SITE_OTHER): Payer: Medicare Other | Admitting: Cardiology

## 2013-12-20 ENCOUNTER — Encounter: Payer: Self-pay | Admitting: Cardiology

## 2013-12-20 VITALS — BP 130/74 | HR 66 | Ht 71.0 in | Wt 213.0 lb

## 2013-12-20 DIAGNOSIS — E7800 Pure hypercholesterolemia, unspecified: Secondary | ICD-10-CM

## 2013-12-20 DIAGNOSIS — I10 Essential (primary) hypertension: Secondary | ICD-10-CM

## 2013-12-20 DIAGNOSIS — M25562 Pain in left knee: Secondary | ICD-10-CM

## 2013-12-20 DIAGNOSIS — I25810 Atherosclerosis of coronary artery bypass graft(s) without angina pectoris: Secondary | ICD-10-CM

## 2013-12-20 MED ORDER — NITROGLYCERIN 0.4 MG SL SUBL: 0.4000 mg | SUBLINGUAL_TABLET | SUBLINGUAL | Status: DC | PRN

## 2013-12-20 NOTE — Patient Instructions (Signed)
Continue your current therapy, reduce your intake of sugar and simple carbohydrates  We will refer you to [facility]  I will see you in 6 months.

## 2013-12-20 NOTE — Addendum Note (Signed)
Addended by: Golden Hurter D on: 12/20/2013 09:15 AM   Modules accepted: Orders

## 2013-12-20 NOTE — Progress Notes (Signed)
[name] Date of Birth: 09-27-1934   History of Present Illness: [name] is seen today for followup. He has a history of coronary disease and is status post CABG in 2006.  He had a normal stress Myoview study in February 2014. He has a history of  hairy cell leukemia. He was treated with Cladribine at [facility]. Now off chemotherapy. He denies any symptoms of chest pain. He does note SOB that he attributes to lack of exercise. He has chronic insomnia. He complains of pain and clicking in his left knee that really limits his ability to walk. He no longer has Raynaud's phenomena.  Current Outpatient Prescriptions on File Prior to Visit  Medication Sig Dispense Refill  . acetaminophen (TYLENOL) 325 MG tablet Take 325 mg by mouth every 6 (six) hours as needed for pain.    [name] aspirin 81 MG tablet Take 81 mg by mouth daily.    [name] FOLIC ACID PO Take 762 mcg by mouth daily.     . isosorbide mononitrate (IMDUR) 60 MG 24 hr tablet Take 1 tablet (60 mg total) by mouth daily. 90 tablet 3  . latanoprost (XALATAN) 0.005 % ophthalmic solution Place 1 drop into both eyes at bedtime.     [name] losartan (COZAAR) 50 MG tablet Take 1 tablet (50 mg total) by mouth daily. 90 tablet 3  . metoprolol succinate (TOPROL-XL) 50 MG 24 hr tablet Take 1 tablet (50 mg total) by mouth every evening. 90 tablet 3  . multivitamin-lutein (OCUVITE-LUTEIN) CAPS Take 1 capsule by mouth every evening.    [name] omeprazole (PRILOSEC) 20 MG capsule TAKE ONE CAPSULE BY MOUTH EVERY EVENING 90 capsule 0  . simvastatin (ZOCOR) 20 MG tablet Take 1 tablet (20 mg total) by mouth every evening. 90 tablet 3   No current facility-administered medications on file prior to visit.    Allergies  Allergen Reactions  . Clindamycin Other (See Comments)    Unknown reaction from childhood    Past Medical History  Diagnosis Date  . GERD (gastroesophageal reflux disease)   . Hypertension   . Hyperlipidemia   . Insomnia    . Hypertriglyceridemia   . History of atherosclerotic cardiovascular disease   . Bladder neck contracture   . Stable angina   . History of myocardial infarction 2006   S/P CABG  . S/P CABG x 4 2006  . Coronary artery disease CARDIOLOGIST- [name]-  LAST VISIT NOTE 12-20-2010 IN EPIC AND W/ CHART    subendocranial MI; left inter. mamm to LAD, saph vein to diag; saph vein to distal circ; saph vein to 1st obtuse  . Arthritis   . H/O hiatal hernia   . History of kidney stones   . Glaucoma BILATERAL  . Cataract immature BILATERAL  . Frequency of urination   . Urgency of urination   . Nocturia   . History of prostate cancer S/P PROSTATECTOMY-  NO RECURRENCE    FOLLOWED BY Derek [name]  . Macular degeneration   . Raynaud's phenomenon   . Right arm cellulitis 04/02/12    post cat bite  . Hairy cell leukemia     Past Surgical History  Procedure Laterality Date  . Debridement tennis elbow  2002    RIGHT; [facility]  . Robot assisted laparoscopic radical prostatectomy  05-11-2006  . Cardiac catheterization  07-30-2006  [name]    POST CABG / OCCLUDED DIAGONAL &  FOURTH OBTUSE MARGINAL GRAFTS/ NORMAL LVF/ PATENT  LIMA TO LAD & SEPHENOUS VEIN GRAFT TO 1ST OBTUSE MARGINAL   . Cardiac catheterization  04-23-2004    CRITICAL THREE-VESSEL CAD/ PRESERVED LVF  . Laparoscopic cholecystectomy  02-05-2005  . Coronary artery bypass graft  04-25-2004  X4 VESSELS    left inter. mamm to LAD, saph vein to diag; saph vein to distal circ; saph vein to 1st obtuse  . Cystoscopy  04/30/2011    Procedure: CYSTOSCOPY;  Surgeon: Derek Amass, MD;  Location: [facility];  Service: Urology;  Laterality: N/A;  30 MINUTES   . Balloon dilation  04/30/2011    Procedure: BALLOON DILATION;  Surgeon: Derek Amass, MD;  Location: [facility];  Service: Urology;  Laterality: N/A;    History  Smoking status  . Former Smoker -- 3 years  . Types: Cigarettes, Cigars  Smokeless tobacco   . Never Used    Comment: QUIT SMOKING CIGARETTES 1964 AND QUIT OCCASIONAL CIGAR  2007    History  Alcohol Use  . 8.4 oz/week  . 7 Glasses of wine, 7 Shots of liquor per week    Family History  Problem Relation Age of Onset  . Heart disease Sister     CABGX 1 sister and stents X1 sister  . Cancer Sister   . Parkinsonism Father   . Diabetes Neg Hx   . Stroke Neg Hx     Review of Systems: As noted in history of present illness. All other systems were reviewed and are negative.  Physical Exam: BP 130/74 mmHg  Pulse 66  Ht 5\' 11"  (1.803 m)  Wt 213 lb (96.616 kg)  BMI 29.72 kg/m2 He is a pleasant white male in no acute distress. HEENT normocephalic, atraumatic. Pupils are equal round and reactive to light. Oropharynx is clear. Neck is supple without JVD, adenopathy, thyromegaly, or bruits. Lungs are clear. Cardiac exam reveals a regular rate and rhythm without gallop or murmur. His abdomen is soft and nontender. He has no edema. Pedal pulses are good. Left knee is not swollen but does have a click with ROM. He is alert oriented x3. Cranial nerves II through XII are intact.  LABORATORY DATA: Lab Results  Component Value Date   WBC 4.9 07/20/2012   HGB 14.2 07/20/2012   HCT 40.7 07/20/2012   PLT 128* 07/20/2012   GLUCOSE 131* 12/15/2013   CHOL 187 12/15/2013   TRIG 164* 12/15/2013   HDL 58 12/15/2013   LDLCALC 96 12/15/2013   ALT 59* 12/15/2013   AST 38* 12/15/2013   NA 138 12/15/2013   K 4.5 12/15/2013   CL 103 12/15/2013   CREATININE 1.08 12/15/2013   BUN 14 12/15/2013   CO2 26 12/15/2013   TSH 0.559 03/26/2012   INR 1.19 03/29/2012   HGBA1C 5.9* 05/26/2012   Ecvg today shows NSR with LAFB and LVH. No change. I have personally reviewed and interpreted this study.   Assessment / Plan: 1. Coronary disease status post CABG. Clinically without significant symptoms. Myoview study in February 2014was normal. Continue aspirin, isosorbide, and metoprolol. I have  encouraged him to get regular aerobic exercise.   2. Hypertension. Blood pressure is well controlled.   3. Hyperlipidemia. Continue current Zocor dose. Higher doses in the past lead to myalgias. Needs to do better with diet- Mediterranean.  4. Hairy cell leukemia. In remission.  5. Raynaud's phenomenon. Resolved.  6. Prediabetes. Focus on avoiding sugar and simple carbs. Lose weight  7. Left knee pain. Will refer  to ortho.

## 2014-01-09 ENCOUNTER — Other Ambulatory Visit: Payer: Self-pay | Admitting: Cardiology

## 2014-01-09 MED ORDER — OMEPRAZOLE 20 MG PO CPDR: 20.0000 mg | DELAYED_RELEASE_CAPSULE | Freq: Every evening | ORAL | Status: DC

## 2014-01-10 ENCOUNTER — Other Ambulatory Visit: Payer: Self-pay

## 2014-01-10 ENCOUNTER — Ambulatory Visit (INDEPENDENT_AMBULATORY_CARE_PROVIDER_SITE_OTHER): Payer: Medicare Other | Admitting: Internal Medicine

## 2014-01-10 ENCOUNTER — Ambulatory Visit: Payer: Medicare Other | Admitting: Internal Medicine

## 2014-01-10 ENCOUNTER — Encounter: Payer: Self-pay | Admitting: Internal Medicine

## 2014-01-10 VITALS — BP 138/90 | HR 73 | Temp 97.8°F | Ht 71.25 in | Wt 213.0 lb

## 2014-01-10 DIAGNOSIS — G479 Sleep disorder, unspecified: Secondary | ICD-10-CM

## 2014-01-10 DIAGNOSIS — L218 Other seborrheic dermatitis: Secondary | ICD-10-CM

## 2014-01-10 DIAGNOSIS — L219 Seborrheic dermatitis, unspecified: Secondary | ICD-10-CM

## 2014-01-10 MED ORDER — OMEPRAZOLE 20 MG PO CPDR: 20.0000 mg | DELAYED_RELEASE_CAPSULE | Freq: Every evening | ORAL | Status: DC

## 2014-01-10 NOTE — Progress Notes (Signed)
Pre visit review using our clinic review tool, if applicable. No additional management support is needed unless otherwise documented below in the visit note. 

## 2014-01-10 NOTE — Patient Instructions (Signed)
Use a coal tar shampoo such as T-gel 2 to 3 times per week to control the dermatitis. Use Eucerin or Aveeno Daily  Moisturizing Lotion  daily  for the dry skin.   To prevent sleep dysfunction follow these instructions for sleep hygiene.  Do not ingest stimulants ( decongestants, diet pills, nicotine, caffeine) after the evening meal.Do not take daytime naps.Cardiovascular exercise, this can be as simple a program as water aerobics, is recommended 30-45 minutes 3-4 times per week once cleared by Orthopedics.

## 2014-01-10 NOTE — Progress Notes (Signed)
   Subjective:    Patient ID: [name], male    DOB: 04-27-1934, 79 y.o.   MRN: [id number]  HPI  He's concerned he may have shingles as he has had itching at the left posterior occipital area and @ the right inferior scalp line.  He's used Head and Shoulders shampoo as well as cortisone 10 with temporary relief  He has scratched the scalp to the point that it bleeds  He did have a history of shingles over the left forehead remotely & worries this is a recurrence.   He also is having sleep disruption. He has no trouble going to sleep at 9-10 PM. He typically will awaken 2-3 AM after 5-6 hours of sleep. He cannot go back to sleep usually; but occasionally he is able to return to sleep. He denies any stimulants after evening meal . He has been taking daytime naps. He does have minor snoring. He denies any apnea per his wife.  He's not been engaged in cardiovascular exercise due to knee pain. He has a pending appointment with an Orthopedist    Review of Systems   No associated itchy, watery eyes.  Swelling of the lips or tongue denied.  Shortness of breath, wheezing, or cough absent.  Fever ,chills , or sweats denied. Purulence absent.  Diarrhea not present.     Objective:   Physical Exam  Positive or pertinent findings include: He has a scaly eczematoid rash most notable at the right inferior occipital area. There is ptosis of the left eye There is nasal septal deviation slightly to the right and dislocation to the left. There is no crowding of the oropharyngeal space. He has a grade 1 systolic murmur  General appearance :adequately nourished; in no distress. Eyes: No conjunctival inflammation or scleral icterus is present. Oral exam: Dental hygiene is good. Lips and gums are healthy appearing.There is no oropharyngeal erythema or exudate noted.  Heart:  Normal rate and regular rhythm. S1 and S2 normal without gallop, click, rub or other extra sounds     Lungs:Chest clear to auscultation; no wheezes, rhonchi,rales ,or rubs present.No increased work of breathing.  Skin:Warm & dry.  Intact without suspicious lesions or rashes except in scalp ; no jaundice or tenting Lymphatic: No lymphadenopathy is noted about the head, neck, axilla         Assessment & Plan:  #1 seborrheic scalp dermatitis  #2 sleep disorder in the context of decreased cardio vascular exercise and daytime naps  Plan: See after visit summary

## 2014-03-29 ENCOUNTER — Telehealth: Payer: Self-pay

## 2014-03-29 NOTE — Telephone Encounter (Signed)
Pt recd flu vaccine in October 2015

## 2014-03-29 NOTE — Telephone Encounter (Signed)
Patient did receive flu vaccine

## 2014-06-29 ENCOUNTER — Other Ambulatory Visit: Payer: Self-pay

## 2014-06-29 DIAGNOSIS — I10 Essential (primary) hypertension: Secondary | ICD-10-CM

## 2014-06-29 MED ORDER — METOPROLOL SUCCINATE ER 50 MG PO TB24: 50.0000 mg | ORAL_TABLET | Freq: Every evening | ORAL | Status: DC

## 2014-06-29 NOTE — Telephone Encounter (Signed)
Rx(s) sent to pharmacy electronically.  

## 2014-07-06 ENCOUNTER — Telehealth: Payer: Self-pay | Admitting: Cardiology

## 2014-07-06 DIAGNOSIS — I25810 Atherosclerosis of coronary artery bypass graft(s) without angina pectoris: Secondary | ICD-10-CM

## 2014-07-06 DIAGNOSIS — E7800 Pure hypercholesterolemia, unspecified: Secondary | ICD-10-CM

## 2014-07-06 NOTE — Telephone Encounter (Signed)
Returned call to patient he is due for fasting lab.Advised to have fasting lab about 1 week before 09/05/14 appointment with Dr.Jordan.Lab orders mailed.

## 2014-07-06 NOTE — Telephone Encounter (Signed)
[name] is calling to see if he is going to need lab work done before his appt date and time on 09/05/14.[name] Please call  Thanks

## 2014-07-10 ENCOUNTER — Other Ambulatory Visit: Payer: Self-pay

## 2014-07-11 ENCOUNTER — Encounter: Payer: Self-pay | Admitting: Gastroenterology

## 2014-08-09 ENCOUNTER — Telehealth: Payer: Self-pay | Admitting: *Deleted

## 2014-08-09 LAB — BASIC METABOLIC PANEL
BUN: 15 mg/dL (ref 7–25)
CO2: 23 mEq/L (ref 20–31)
Calcium: 9.2 mg/dL (ref 8.6–10.3)
Chloride: 103 mEq/L (ref 98–110)
Creat: 1.13 mg/dL (ref 0.70–1.18)
Glucose, Bld: 142 mg/dL — ABNORMAL HIGH (ref 65–99)
Potassium: 4.3 mEq/L (ref 3.5–5.3)
SODIUM: 139 meq/L (ref 135–146)

## 2014-08-09 LAB — HEPATIC FUNCTION PANEL
ALBUMIN: 4.1 g/dL (ref 3.6–5.1)
ALT: 57 U/L — ABNORMAL HIGH (ref 9–46)
AST: 33 U/L (ref 10–35)
Alkaline Phosphatase: 40 U/L (ref 40–115)
BILIRUBIN TOTAL: 0.7 mg/dL (ref 0.2–1.2)
Bilirubin, Direct: 0.2 mg/dL (ref <=0.2)
Indirect Bilirubin: 0.5 mg/dL (ref 0.2–1.2)
Total Protein: 6.3 g/dL (ref 6.1–8.1)

## 2014-08-09 LAB — LIPID PANEL
CHOL/HDL RATIO: 3 ratio (ref <=5.0)
CHOLESTEROL: 145 mg/dL (ref 125–200)
HDL: 49 mg/dL (ref >=40)
LDL Cholesterol: 59 mg/dL (ref <130)
Triglycerides: 187 mg/dL — ABNORMAL HIGH (ref <150)
VLDL: 37 mg/dL — ABNORMAL HIGH (ref <30)

## 2014-08-09 NOTE — Telephone Encounter (Signed)
OPEN ERROR

## 2014-08-16 ENCOUNTER — Other Ambulatory Visit: Payer: Self-pay

## 2014-08-16 MED ORDER — LOSARTAN POTASSIUM 50 MG PO TABS: 50.0000 mg | ORAL_TABLET | Freq: Every day | ORAL | Status: DC

## 2014-08-18 ENCOUNTER — Encounter: Payer: Self-pay | Admitting: Cardiology

## 2014-08-18 ENCOUNTER — Ambulatory Visit (INDEPENDENT_AMBULATORY_CARE_PROVIDER_SITE_OTHER): Payer: Medicare Other | Admitting: Cardiology

## 2014-08-18 VITALS — BP 128/84 | HR 60 | Ht 71.0 in | Wt 214.6 lb

## 2014-08-18 DIAGNOSIS — I25810 Atherosclerosis of coronary artery bypass graft(s) without angina pectoris: Secondary | ICD-10-CM

## 2014-08-18 DIAGNOSIS — I10 Essential (primary) hypertension: Secondary | ICD-10-CM | POA: Diagnosis not present

## 2014-08-18 DIAGNOSIS — E7800 Pure hypercholesterolemia, unspecified: Secondary | ICD-10-CM

## 2014-08-18 NOTE — Progress Notes (Signed)
[name] Date of Birth: 1934-07-19   History of Present Illness: [name] is seen today for followup CAD. He has a history of coronary disease and is status post CABG in 2006.  He had a normal stress Myoview study in February 2014. He has a history of  hairy cell leukemia. He was treated with Cladribine at [facility] completed in 2014.  He denies any symptoms of chest pain.  He has chronic insomnia. He goes to sleep well but wakes up at 3 am and can't get back to sleep. Activity limited by knee pain.  Current Outpatient Prescriptions on File Prior to Visit  Medication Sig Dispense Refill  . aspirin 81 MG tablet Take 81 mg by mouth daily.    [name] FOLIC ACID PO Take 275 mcg by mouth daily.     . isosorbide mononitrate (IMDUR) 60 MG 24 hr tablet Take 1 tablet (60 mg total) by mouth daily. 90 tablet 3  . latanoprost (XALATAN) 0.005 % ophthalmic solution Place 1 drop into both eyes at bedtime.     [name] losartan (COZAAR) 50 MG tablet Take 1 tablet (50 mg total) by mouth daily. 30 tablet 1  . LUTEIN PO Take 1 tablet by mouth daily.    . metoprolol succinate (TOPROL-XL) 50 MG 24 hr tablet Take 1 tablet (50 mg total) by mouth every evening. 90 tablet 1  . nitroGLYCERIN (NITROSTAT) 0.4 MG SL tablet Place 1 tablet (0.4 mg total) under the tongue every 5 (five) minutes as needed for chest pain. 100 tablet 3  . omeprazole (PRILOSEC) 20 MG capsule Take 1 capsule (20 mg total) by mouth every evening. 30 capsule 6  . simvastatin (ZOCOR) 20 MG tablet Take 1 tablet (20 mg total) by mouth every evening. 90 tablet 3   No current facility-administered medications on file prior to visit.    Allergies  Allergen Reactions  . Clindamycin Other (See Comments)    Unknown reaction from childhood    Past Medical History  Diagnosis Date  . GERD (gastroesophageal reflux disease)   . Hypertension   . Hyperlipidemia   . Insomnia   . Hypertriglyceridemia   . History of atherosclerotic  cardiovascular disease   . Bladder neck contracture   . Stable angina   . History of myocardial infarction 2006   S/P CABG  . S/P CABG x 4 2006  . Coronary artery disease CARDIOLOGIST- DR [name]-  LAST VISIT NOTE 12-20-2010 IN EPIC AND W/ CHART    subendocranial MI; left inter. mamm to LAD, saph vein to diag; saph vein to distal circ; saph vein to 1st obtuse  . Arthritis   . H/O hiatal hernia   . History of kidney stones   . Glaucoma BILATERAL  . Cataract immature BILATERAL  . Frequency of urination   . Urgency of urination   . Nocturia   . History of prostate cancer S/P PROSTATECTOMY-  NO RECURRENCE    FOLLOWED BY DR [name]  . Macular degeneration   . Raynaud's phenomenon   . Right arm cellulitis 04/02/12    post cat bite  . Hairy cell leukemia     Past Surgical History  Procedure Laterality Date  . Debridement tennis elbow  2002    RIGHT; [facility]  . Robot assisted laparoscopic radical prostatectomy  05-11-2006  . Cardiac catheterization  07-30-2006  DR [name]    POST CABG / OCCLUDED DIAGONAL &  FOURTH OBTUSE MARGINAL GRAFTS/ NORMAL LVF/ PATENT LIMA  TO LAD & SEPHENOUS VEIN GRAFT TO 1ST OBTUSE MARGINAL   . Cardiac catheterization  04-23-2004    CRITICAL THREE-VESSEL CAD/ PRESERVED LVF  . Laparoscopic cholecystectomy  02-05-2005  . Coronary artery bypass graft  04-25-2004  X4 VESSELS    left inter. mamm to LAD, saph vein to diag; saph vein to distal circ; saph vein to 1st obtuse  . Cystoscopy  04/30/2011    Procedure: CYSTOSCOPY;  Surgeon: Derek Amass, MD;  Location: [facility];  Service: Urology;  Laterality: N/A;  30 MINUTES   . Balloon dilation  04/30/2011    Procedure: BALLOON DILATION;  Surgeon: Derek Amass, MD;  Location: [facility];  Service: Urology;  Laterality: N/A;    History  Smoking status  . Former Smoker -- 3 years  . Types: Cigarettes, Cigars  Smokeless tobacco  . Never Used    Comment: QUIT SMOKING CIGARETTES 1964  AND QUIT OCCASIONAL CIGAR  2007    History  Alcohol Use  . 8.4 oz/week  . 7 Glasses of wine, 7 Shots of liquor per week    Family History  Problem Relation Age of Onset  . Heart disease Sister     CABGX 1 sister and stents X1 sister  . Cancer Sister   . Parkinsonism Father   . Diabetes Neg Hx   . Stroke Neg Hx     Review of Systems: As noted in history of present illness. All other systems were reviewed and are negative.  Physical Exam: BP 128/84 mmHg  Pulse 60  Ht 5\' 11"  (1.803 m)  Wt 97.325 kg (214 lb 9 oz)  BMI 29.94 kg/m2 He is a pleasant white male in no acute distress. HEENT normocephalic, atraumatic. Pupils are equal round and reactive to light. Oropharynx is clear. Neck is supple without JVD, adenopathy, thyromegaly, or bruits. Lungs are clear. Cardiac exam reveals a regular rate and rhythm without gallop or murmur. His abdomen is soft and nontender. He has no edema. Pedal pulses are good.  He is alert oriented x3. Cranial nerves II through XII are intact.  LABORATORY DATA: Lab Results  Component Value Date   WBC 4.9 07/20/2012   HGB 14.2 07/20/2012   HCT 40.7 07/20/2012   PLT 128* 07/20/2012   GLUCOSE 142* 08/08/2014   CHOL 145 08/08/2014   TRIG 187* 08/08/2014   HDL 49 08/08/2014   LDLCALC 59 08/08/2014   ALT 57* 08/08/2014   AST 33 08/08/2014   NA 139 08/08/2014   K 4.3 08/08/2014   CL 103 08/08/2014   CREATININE 1.13 08/08/2014   BUN 15 08/08/2014   CO2 23 08/08/2014   TSH 0.559 03/26/2012   INR 1.19 03/29/2012   HGBA1C 5.9* 05/26/2012   Labs from [facility] hospital reviewed from July 28,2016. WBC 2.4, plts 101K. Hgb 15.4. Creatinine 1.27. Other chemistries normal.    Assessment / Plan: 1. Coronary disease status post CABG. Clinically without significant symptoms. Myoview study in February 2014 was normal. Continue aspirin, isosorbide, and metoprolol. I have encouraged him to get regular aerobic exercise.   2. Hypertension. Blood  pressure is well controlled.   3. Hyperlipidemia. LDL is at target. Mildly elevated triglycerides. Continue current Zocor dose. Higher doses in the past lead to myalgias. Needs to do better with diet and weight loss.  4. Hairy cell leukemia. In remission.  5. Raynaud's phenomenon. Resolved.  6. Prediabetes. Focus on avoiding sugar and simple carbs. Lose weight. Follow up with  primary care.

## 2014-08-18 NOTE — Patient Instructions (Signed)
Continue your current therapy  Work on getting your weight down and exercising more.  I will see you in one year

## 2014-08-23 ENCOUNTER — Other Ambulatory Visit: Payer: Self-pay | Admitting: Cardiology

## 2014-08-23 ENCOUNTER — Other Ambulatory Visit: Payer: Self-pay

## 2014-08-23 ENCOUNTER — Encounter: Payer: Self-pay | Admitting: Cardiology

## 2014-08-23 DIAGNOSIS — I10 Essential (primary) hypertension: Secondary | ICD-10-CM

## 2014-08-23 MED ORDER — OMEPRAZOLE 20 MG PO CPDR: 20.0000 mg | DELAYED_RELEASE_CAPSULE | Freq: Every evening | ORAL | Status: DC

## 2014-08-23 MED ORDER — LOSARTAN POTASSIUM 50 MG PO TABS: 50.0000 mg | ORAL_TABLET | Freq: Every day | ORAL | Status: DC

## 2014-08-23 MED ORDER — METOPROLOL SUCCINATE ER 50 MG PO TB24: 50.0000 mg | ORAL_TABLET | Freq: Every evening | ORAL | Status: DC

## 2014-08-23 MED ORDER — SIMVASTATIN 20 MG PO TABS: 20.0000 mg | ORAL_TABLET | Freq: Every evening | ORAL | Status: DC

## 2014-08-23 MED ORDER — ISOSORBIDE MONONITRATE ER 60 MG PO TB24: 60.0000 mg | ORAL_TABLET | Freq: Every day | ORAL | Status: DC

## 2014-09-05 ENCOUNTER — Ambulatory Visit: Payer: Self-pay | Admitting: Cardiology

## 2014-09-21 ENCOUNTER — Telehealth: Payer: Self-pay

## 2014-09-21 NOTE — Telephone Encounter (Signed)
Got refill request from [facility] for Imdur. Called pharmacy because it was sent in 08/23/2014. pharmacist stated it was an error so disregard refill request.

## 2014-11-27 IMAGING — CR DG HAND COMPLETE 3+V*R*
3 series · 3 of 3 positions shown · non-contrast
Comparison: None.

CLINICAL DATA: Recurrent skin infections

RIGHT HAND - COMPLETE 3+ VIEW

[x hand pa right]
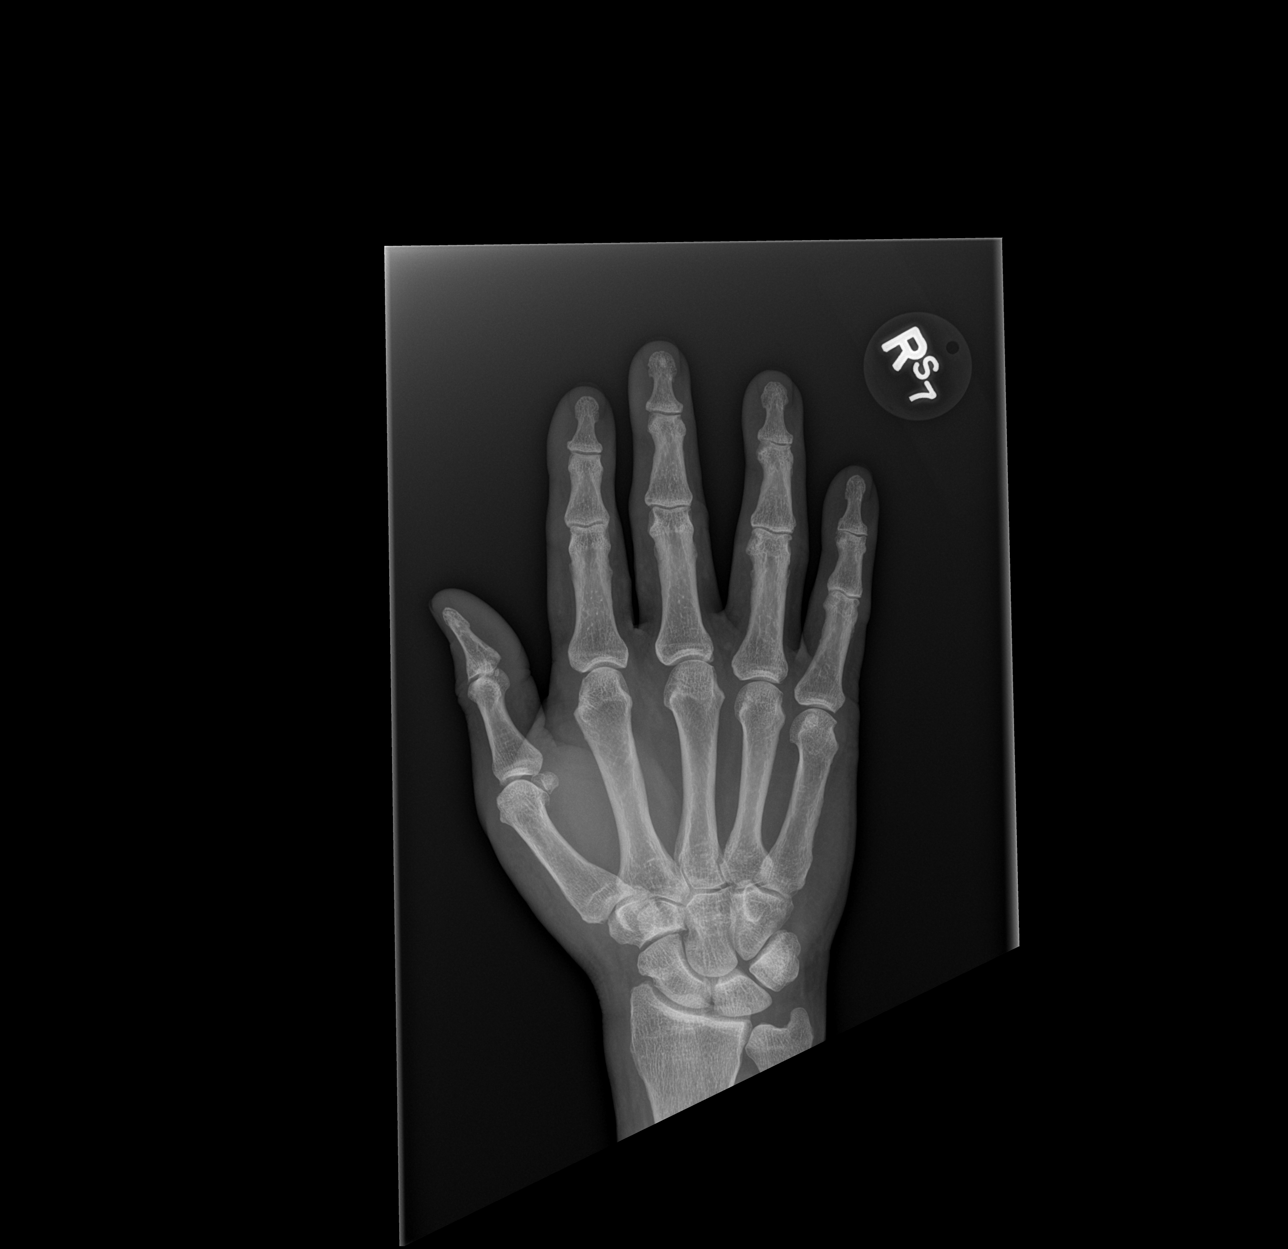

[x hand obl right]
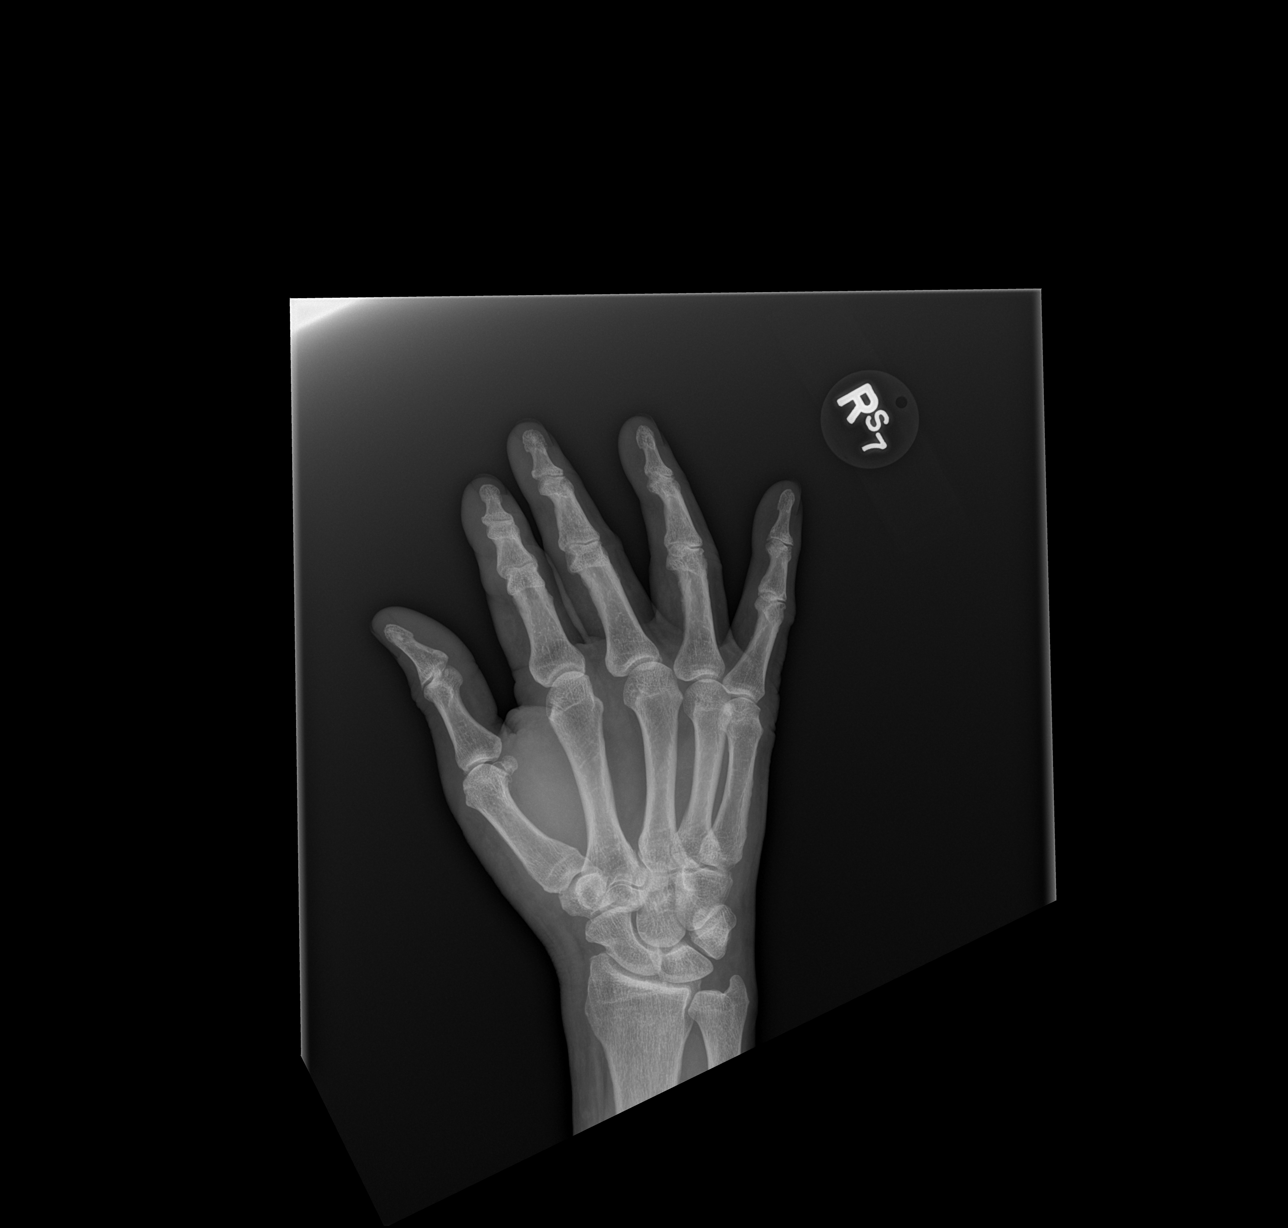

[x hand lat right]
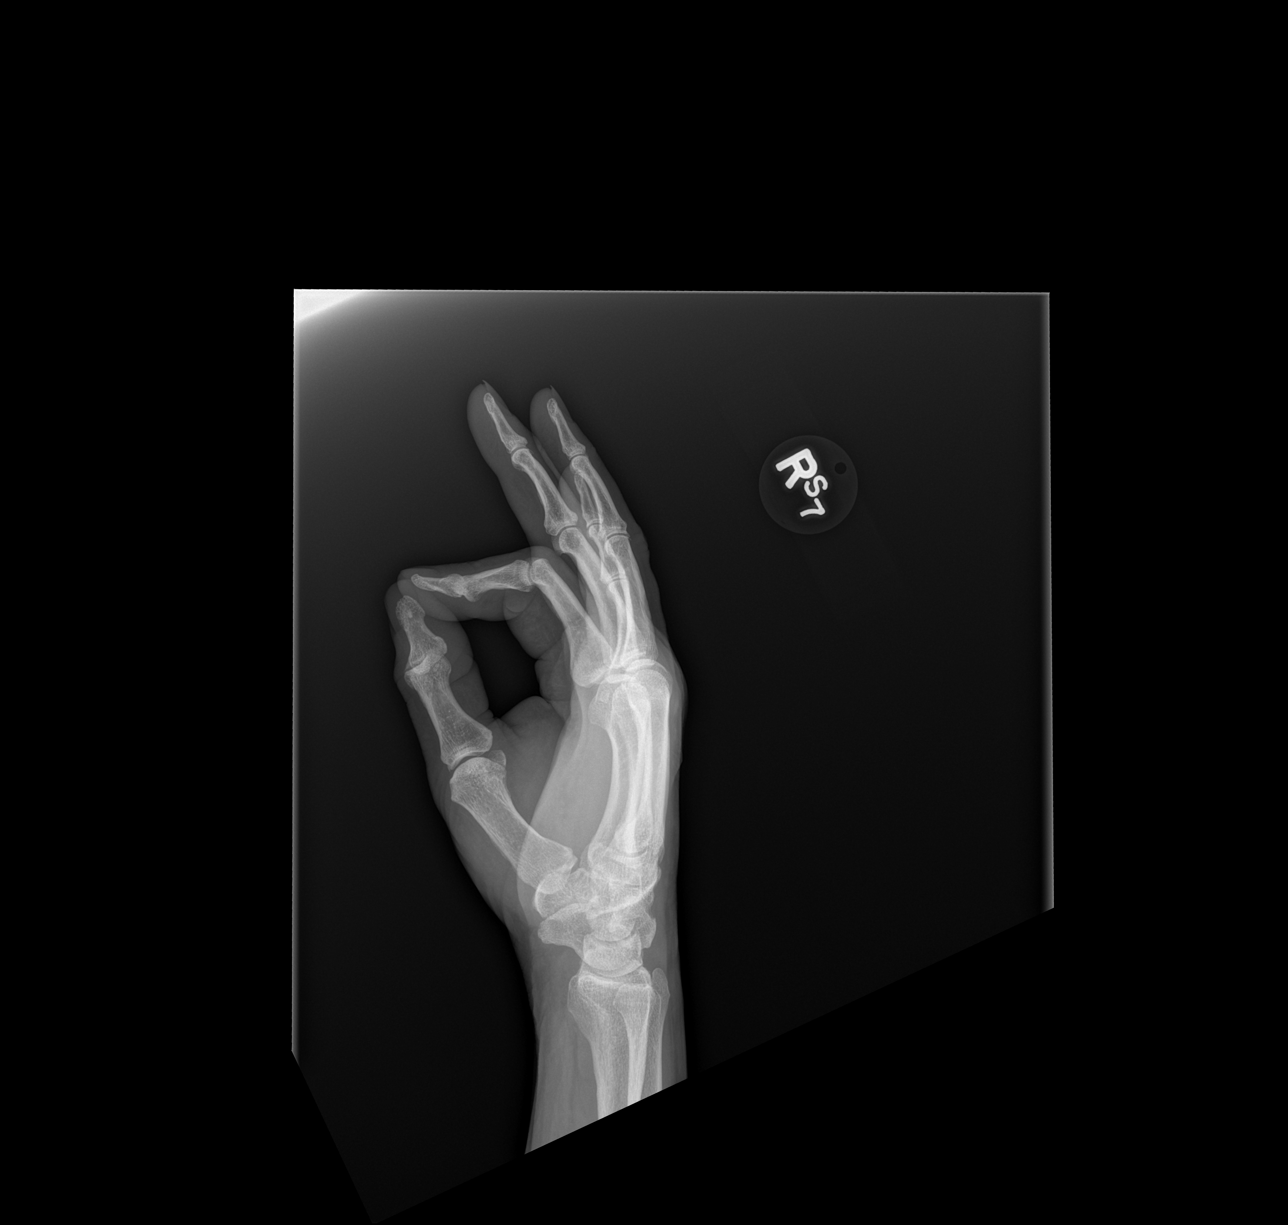

[3 of 3 positions shown; findings below may reference images not displayed]

FINDINGS: Negative for fracture.  Negative for arthropathy.  No
foreign body or soft tissue swelling.
IMPRESSION: Negative

## 2015-01-08 ENCOUNTER — Encounter: Payer: Self-pay | Admitting: Cardiology

## 2015-01-09 ENCOUNTER — Other Ambulatory Visit: Payer: Self-pay

## 2015-01-09 DIAGNOSIS — I10 Essential (primary) hypertension: Secondary | ICD-10-CM

## 2015-01-09 MED ORDER — METOPROLOL SUCCINATE ER 50 MG PO TB24: 50.0000 mg | ORAL_TABLET | Freq: Every evening | ORAL | Status: DC

## 2015-01-09 MED ORDER — LOSARTAN POTASSIUM 50 MG PO TABS: 50.0000 mg | ORAL_TABLET | Freq: Every day | ORAL | Status: DC

## 2015-01-09 MED ORDER — OMEPRAZOLE 20 MG PO CPDR: 20.0000 mg | DELAYED_RELEASE_CAPSULE | Freq: Every evening | ORAL | Status: DC

## 2015-01-09 MED ORDER — NITROGLYCERIN 0.4 MG SL SUBL
0.4000 mg | SUBLINGUAL_TABLET | SUBLINGUAL | Status: DC | PRN
Start: 2015-01-09 — End: 2017-10-14

## 2015-01-11 ENCOUNTER — Telehealth: Payer: Self-pay | Admitting: *Deleted

## 2015-01-11 DIAGNOSIS — I10 Essential (primary) hypertension: Secondary | ICD-10-CM

## 2015-01-11 MED ORDER — OMEPRAZOLE 20 MG PO CPDR: 20.0000 mg | DELAYED_RELEASE_CAPSULE | Freq: Every evening | ORAL | Status: DC

## 2015-01-11 MED ORDER — ISOSORBIDE MONONITRATE ER 60 MG PO TB24: 60.0000 mg | ORAL_TABLET | Freq: Every day | ORAL | Status: DC

## 2015-01-11 MED ORDER — SIMVASTATIN 20 MG PO TABS: 20.0000 mg | ORAL_TABLET | Freq: Every evening | ORAL | Status: DC

## 2015-01-11 MED ORDER — LOSARTAN POTASSIUM 50 MG PO TABS: 50.0000 mg | ORAL_TABLET | Freq: Every day | ORAL | Status: DC

## 2015-01-11 MED ORDER — METOPROLOL SUCCINATE ER 50 MG PO TB24: 50.0000 mg | ORAL_TABLET | Freq: Every evening | ORAL | Status: DC

## 2015-01-11 NOTE — Telephone Encounter (Signed)
prescription was send into pt [facility] pharmacy.[name]

## 2015-01-12 ENCOUNTER — Telehealth: Payer: Self-pay | Admitting: Cardiology

## 2015-01-12 NOTE — Telephone Encounter (Signed)
Close encounter °Error °

## 2015-01-23 ENCOUNTER — Ambulatory Visit (INDEPENDENT_AMBULATORY_CARE_PROVIDER_SITE_OTHER): Payer: Medicare Other | Admitting: Internal Medicine

## 2015-01-23 ENCOUNTER — Encounter: Payer: Self-pay | Admitting: Internal Medicine

## 2015-01-23 VITALS — BP 136/78 | HR 70 | Temp 98.5°F | Resp 18 | Wt 201.0 lb

## 2015-01-23 DIAGNOSIS — J069 Acute upper respiratory infection, unspecified: Secondary | ICD-10-CM | POA: Diagnosis not present

## 2015-01-23 DIAGNOSIS — C9142 Hairy cell leukemia, in relapse: Secondary | ICD-10-CM

## 2015-01-23 DIAGNOSIS — R251 Tremor, unspecified: Secondary | ICD-10-CM

## 2015-01-23 DIAGNOSIS — R252 Cramp and spasm: Secondary | ICD-10-CM

## 2015-01-23 MED ORDER — AMOXICILLIN-POT CLAVULANATE 875-125 MG PO TABS: 1.0000 | ORAL_TABLET | Freq: Two times a day (BID) | ORAL | Status: DC

## 2015-01-23 MED ORDER — HYDROCODONE-HOMATROPINE 5-1.5 MG/5ML PO SYRP: 5.0000 mL | ORAL_SOLUTION | Freq: Three times a day (TID) | ORAL | Status: DC | PRN

## 2015-01-23 NOTE — Assessment & Plan Note (Signed)
First complained of this tremor in 2012, but seems worse in the past year No muscle rigidity noted on exam and no other complaints Likely essential benign tremor Discussed that we could adjust his medication, but it does not bother him enough to make any changes We'll just monitor for now

## 2015-01-23 NOTE — Patient Instructions (Signed)

## 2015-01-23 NOTE — Progress Notes (Signed)
Pre visit review using our clinic review tool, if applicable. No additional management support is needed unless otherwise documented below in the visit note. 

## 2015-01-23 NOTE — Progress Notes (Signed)
Subjective:    Patient ID: [name], male    DOB: 10/01/1934, 81 y.o.   MRN: [id number]  HPI He is currently undergoing chemotherapy for hairy cell leukemia. His oncologist is at [facility]. He brought a copy of his most recent blood work that she was a very low white blood cell count. He has been warned about possible fevers and infections, which is why he is here today.  He has had sinus drainage for weeks. His wife has been sick and recently he became sick over the past several days. He states coughing that has been in such violent fits he is almost blacked out. Yesterday this was severe. He has been coughing up a lot of white phlegm, but denies any significant color of the phlegm. He has nasal congestion and significant postnasal drip, along with a sore throat, shortness of breath, wheezing and headaches. He does have some nausea, but feels this is related to the chemotherapy. He denies any known fever, ear pain, sinus pain, abdominal pain or diarrhea.  Tremor: He states he has had a tremor for approximately one year. Looking through his medical record he has actually had a tremor for longer. His previous primary care physician thought this was an essential tremor and encouraged him to avoid caffeine, chocolate and stimulants. He feels the tremor has gotten worse. It does affect his writing slightly. He denies any muscle weakness or rigidity. He does not have a family history of tremor.  Muscle cramping: He states for several months he has been experiencing muscle cramping. He notes it in his ankle, leg and neck. He feels this is been going on for a while, but is unsure how long. Looking through his chart he has mentioned this years ago. He notes that it has improved with increasing his water intake. His dose of simvastatin was decreased in the past and it did not seem to help.   Medications and allergies reviewed with patient and updated if appropriate.  Patient Active Problem List   Diagnosis Date Noted  . Knee pain, left 12/20/2013  . IBS (irritable bowel syndrome) 07/21/2012  . Hairy cell leukemia ([place]) 04/01/2012  . Infection of hand due to bite 03/25/2012  . Pancytopenia ([place]) 03/25/2012  . CAD (coronary artery disease) 03/25/2012  . Raynaud's phenomenon 02/10/2012  . Bladder neck contracture 04/30/2011  . Tremor 12/20/2010  . Coronary artery disease   . Hypertension   . ECCHYMOSES 08/15/2009  . EUSTACHIAN TUBE DYSFUNCTION, BILATERAL 06/22/2008  . ESOPHAGEAL REFLUX 12/15/2007  . PERSONAL HISTORY OF COLONIC POLYPS 12/15/2007  . EXTERNAL HEMORRHOIDS 12/14/2007  . ANXIETY STATE NOS 11/12/2006  . Hypercholesterolemia 04/20/2006  . DIVERTICULOSIS, COLON 09/05/2003    Current Outpatient Prescriptions on File Prior to Visit  Medication Sig Dispense Refill  . aspirin 81 MG tablet Take 81 mg by mouth daily.    [name] FOLIC ACID PO Take Q000111Q mcg by mouth daily.     . isosorbide mononitrate (IMDUR) 60 MG 24 hr tablet Take 1 tablet (60 mg total) by mouth daily. 90 tablet 2  . latanoprost (XALATAN) 0.005 % ophthalmic solution Place 1 drop into both eyes at bedtime.     [name] losartan (COZAAR) 50 MG tablet Take 1 tablet (50 mg total) by mouth daily. 90 tablet 2  . LUTEIN PO Take 1 tablet by mouth daily.    . metoprolol succinate (TOPROL-XL) 50 MG 24 hr tablet Take 1 tablet (50 mg total) by mouth every evening. 90 tablet  2  . nitroGLYCERIN (NITROSTAT) 0.4 MG SL tablet Place 1 tablet (0.4 mg total) under the tongue every 5 (five) minutes as needed for chest pain (x 3 doses max). 25 tablet 3  . omeprazole (PRILOSEC) 20 MG capsule Take 1 capsule (20 mg total) by mouth every evening. 90 capsule 2  . simvastatin (ZOCOR) 20 MG tablet Take 1 tablet (20 mg total) by mouth every evening. 90 tablet 2   No current facility-administered medications on file prior to visit.    Past Medical History  Diagnosis Date  . GERD (gastroesophageal reflux disease)   . Hypertension   .  Hyperlipidemia   . Insomnia   . Hypertriglyceridemia   . History of atherosclerotic cardiovascular disease   . Bladder neck contracture   . Stable angina ([facility])   . History of myocardial infarction 2006   S/P CABG  . S/P CABG x 4 2006  . Coronary artery disease CARDIOLOGIST- DR Martinique-  LAST VISIT NOTE 12-20-2010 IN EPIC AND W/ CHART    subendocranial MI; left inter. mamm to LAD, saph vein to diag; saph vein to distal circ; saph vein to 1st obtuse  . Arthritis   . H/O hiatal hernia   . History of kidney stones   . Glaucoma BILATERAL  . Cataract immature BILATERAL  . Frequency of urination   . Urgency of urination   . Nocturia   . History of prostate cancer S/P PROSTATECTOMY-  NO RECURRENCE    FOLLOWED BY DR Risa Grill  . Macular degeneration   . Raynaud's phenomenon   . Right arm cellulitis 04/02/12    post cat bite  . Hairy cell leukemia [facility])     Past Surgical History  Procedure Laterality Date  . Debridement tennis elbow  2002    RIGHT; [facility]  . Robot assisted laparoscopic radical prostatectomy  05-11-2006  . Cardiac catheterization  07-30-2006  DR Martinique    POST CABG / OCCLUDED DIAGONAL &  FOURTH OBTUSE MARGINAL GRAFTS/ NORMAL LVF/ PATENT LIMA TO LAD & SEPHENOUS VEIN GRAFT TO 1ST OBTUSE MARGINAL   . Cardiac catheterization  04-23-2004    CRITICAL THREE-VESSEL CAD/ PRESERVED LVF  . Laparoscopic cholecystectomy  02-05-2005  . Coronary artery bypass graft  04-25-2004  X4 VESSELS    left inter. mamm to LAD, saph vein to diag; saph vein to distal circ; saph vein to 1st obtuse  . Cystoscopy  04/30/2011    Procedure: CYSTOSCOPY;  Surgeon: Bernestine Amass, MD;  Location: [facility];  Service: Urology;  Laterality: N/A;  30 MINUTES   . Balloon dilation  04/30/2011    Procedure: BALLOON DILATION;  Surgeon: Bernestine Amass, MD;  Location: [facility];  Service: Urology;  Laterality: N/A;    Social History   Social History  . Marital Status: Married      Spouse Name: N/A  . Number of Children: 2  . Years of Education: N/A   Occupational History  . [employer]    Social History Main Topics  . Smoking status: Former Smoker -- 3 years    Types: Cigarettes, Cigars  . Smokeless tobacco: Never Used     Comment: [place] AND QUIT OCCASIONAL CIGAR  2007  . Alcohol Use: 8.4 oz/week    7 Glasses of wine, 7 Shots of liquor per week  . Drug Use: No  . Sexual Activity: Not Asked   Other Topics Concern  . None   Social History Narrative  Family History  Problem Relation Age of Onset  . Heart disease Sister     CABGX 1 sister and stents X1 sister  . Cancer Sister   . Parkinsonism Father   . Diabetes Neg Hx   . Stroke Neg Hx     Review of Systems  Constitutional: Negative for fever.  HENT: Positive for congestion, postnasal drip and sore throat. Negative for ear pain and sinus pressure.   Respiratory: Positive for cough, shortness of breath and wheezing.   Gastrointestinal: Positive for nausea. Negative for abdominal pain and diarrhea.  Neurological: Positive for headaches.       Objective:   Filed Vitals:   01/23/15 1205  BP: 136/78  Pulse: 70  Temp: 98.5 F (36.9 C)  Resp: 18   Filed Weights   01/23/15 1205  Weight: 201 lb (91.173 kg)   Body mass index is 28.05 kg/(m^2).   Physical Exam GENERAL APPEARANCE: Appears stated age, well appearing, NAD EYES: conjunctiva clear, no icterus HEENT: bilateral tympanic membranes and ear canals normal, oropharynx with mild erythema, no thyromegaly, trachea midline, no cervical or supraclavicular lymphadenopathy LUNGS: Clear to auscultation without wheeze or crackles, unlabored breathing, good air entry bilaterally HEART: Normal S1,S2 without murmurs EXTREMITIES: Without clubbing, cyanosis, or edema MUSCULOSKELETAL:  No rigidity in arms     Assessment & Plan:   URI in the setting of neutropenia He is currently undergoing treatment for hairy  cell leukemia and is neutropenic We will go ahead and start him on an antibiotic given the above Augmentin twice a day 10 days Hycodan cough syrup as needed Continue increased rest and fluids Call if no improvement or symptoms worsen   See problem list for further assessment and plan

## 2015-01-23 NOTE — Assessment & Plan Note (Signed)
We reviewed his recent blood work and his electrolytes were within normal limits, as well as his kidney function He has seen some improvement with increased water intake and he is not drinking enough fluids Increase water intake If there is no improvement he can hold the simvastatin for a week or 2 and see if this improves the muscle cramping, but it seems that he has tried this in the past and there was no improvement. He understands if there is no improvement he needs to go back on medication and if there is improvement we would need to change to a different statin

## 2015-01-24 ENCOUNTER — Telehealth: Payer: Self-pay | Admitting: Internal Medicine

## 2015-01-24 MED ORDER — HYDROCODONE-HOMATROPINE 5-1.5 MG/5ML PO SYRP: 5.0000 mL | ORAL_SOLUTION | Freq: Three times a day (TID) | ORAL | Status: DC | PRN

## 2015-01-24 NOTE — Telephone Encounter (Signed)
Please advise 

## 2015-01-24 NOTE — Telephone Encounter (Signed)
Pt was in yesterday and he called in stating he has lost his prescription for HYDROcodone-homatropine (HYCODAN) 5-1.5 MG/5ML syrup [id number]  Not sure if you can send this to pharmacy but if you can [facility] [address]

## 2015-01-24 NOTE — Telephone Encounter (Signed)
Pt has been informed to come by office and pick up RX

## 2015-01-24 NOTE — Telephone Encounter (Signed)
reprinted

## 2015-02-12 ENCOUNTER — Encounter: Payer: Self-pay | Admitting: Internal Medicine

## 2015-02-13 ENCOUNTER — Ambulatory Visit (INDEPENDENT_AMBULATORY_CARE_PROVIDER_SITE_OTHER): Payer: Medicare Other | Admitting: Family Medicine

## 2015-02-13 ENCOUNTER — Encounter: Payer: Self-pay | Admitting: Family Medicine

## 2015-02-13 VITALS — BP 128/54 | HR 73 | Temp 98.3°F | Wt 205.0 lb

## 2015-02-13 DIAGNOSIS — K5900 Constipation, unspecified: Secondary | ICD-10-CM

## 2015-02-13 NOTE — Progress Notes (Signed)
Garret Reddish, MD  Subjective:  [name] is a 81 y.o. year old very pleasant male patient who presents for/with See problem oriented charting ROS- no abdominal pain, nausea, vomiting. Firm hard stools that are painful are noted  Past Medical History- hair cell leukemia, CAD, history anxiety  Medications- reviewed and updated Current Outpatient Prescriptions  Medication Sig Dispense Refill  . allopurinol (ZYLOPRIM) 300 MG tablet Take 300 mg by mouth daily.    [name] aspirin 81 MG tablet Take 81 mg by mouth daily.    [name] FOLIC ACID PO Take Q000111Q mcg by mouth daily.     . isosorbide mononitrate (IMDUR) 60 MG 24 hr tablet Take 1 tablet (60 mg total) by mouth daily. 90 tablet 2  . latanoprost (XALATAN) 0.005 % ophthalmic solution Place 1 drop into both eyes at bedtime.     [name] losartan (COZAAR) 50 MG tablet Take 1 tablet (50 mg total) by mouth daily. 90 tablet 2  . LUTEIN PO Take 1 tablet by mouth daily.    . metoprolol succinate (TOPROL-XL) 50 MG 24 hr tablet Take 1 tablet (50 mg total) by mouth every evening. 90 tablet 2  . omeprazole (PRILOSEC) 20 MG capsule Take 1 capsule (20 mg total) by mouth every evening. 90 capsule 2  . simvastatin (ZOCOR) 20 MG tablet Take 1 tablet (20 mg total) by mouth every evening. 90 tablet 2  . nitroGLYCERIN (NITROSTAT) 0.4 MG SL tablet Place 1 tablet (0.4 mg total) under the tongue every 5 (five) minutes as needed for chest pain (x 3 doses max). (Patient not taking: Reported on 02/13/2015) 25 tablet 3   Objective: BP 128/54 mmHg  Pulse 73  Temp(Src) 98.3 F (36.8 C)  Wt 205 lb (92.987 kg) Gen: NAD, resting comfortably CV: RRR no murmurs rubs or gallops Lungs: CTAB no crackles, wheeze, rhonchi Abdomen: soft/nontender/nondistended/normal bowel sounds. No rebound or guarding.  Ext: 1+ edema Skin: warm, dry, no rash Neuro: grossly normal, moves all extremities  Assessment/Plan: Constipation S: symptoms for somewhat over a week. Last bowel movement was this  morning and prior to that was having daily small but very very firm and painful bowel movement. Dulcolx did not improve issues then [facility] and started colace 50mg  and miralax 1 capful a day. Finally this morning had a significant movement an hour or two after first BM.   Reviewed medicines and had been on hycodan in mid january A/P: Suspect this constipation is related to hycodan. Improving with miralax. Counseling and follow up per AVS  Return precautions advised.   >50% of 25 minute office visit was spent on counseling (explaining how opiates affect bowel transit, constipation management instructions, reviewing avs, discussing transitioning of care from prior PCP) and coordination of care

## 2015-02-13 NOTE — Patient Instructions (Signed)
Continue miralax (the one capful that you mix with water) for 1 more week. If having regular bowel movements, then go down to every other day for a week then stop  I think all of this is related to the cough medicine you were on which has slowed your symptom- The miralax hopefully will reset you  You can stop the stool softener (colace)  For check out: May schedule patient for a 8:15, 8:45, 1:15, 1:45 with me within 3 months to establish with me. Does not have to be Monday only

## 2015-02-14 DIAGNOSIS — C9142 Hairy cell leukemia, in relapse: Secondary | ICD-10-CM | POA: Diagnosis not present

## 2015-02-15 DIAGNOSIS — R05 Cough: Secondary | ICD-10-CM | POA: Diagnosis not present

## 2015-02-15 DIAGNOSIS — E871 Hypo-osmolality and hyponatremia: Secondary | ICD-10-CM | POA: Diagnosis not present

## 2015-02-15 DIAGNOSIS — Z5111 Encounter for antineoplastic chemotherapy: Secondary | ICD-10-CM | POA: Diagnosis not present

## 2015-02-15 DIAGNOSIS — D61818 Other pancytopenia: Secondary | ICD-10-CM | POA: Diagnosis not present

## 2015-02-15 DIAGNOSIS — Z79899 Other long term (current) drug therapy: Secondary | ICD-10-CM | POA: Diagnosis not present

## 2015-02-15 DIAGNOSIS — Z5181 Encounter for therapeutic drug level monitoring: Secondary | ICD-10-CM | POA: Diagnosis not present

## 2015-02-15 DIAGNOSIS — R252 Cramp and spasm: Secondary | ICD-10-CM | POA: Diagnosis not present

## 2015-02-15 DIAGNOSIS — C9142 Hairy cell leukemia, in relapse: Secondary | ICD-10-CM | POA: Diagnosis not present

## 2015-02-19 ENCOUNTER — Encounter: Payer: Self-pay | Admitting: Gastroenterology

## 2015-02-21 DIAGNOSIS — C9142 Hairy cell leukemia, in relapse: Secondary | ICD-10-CM | POA: Diagnosis not present

## 2015-02-28 DIAGNOSIS — C9142 Hairy cell leukemia, in relapse: Secondary | ICD-10-CM | POA: Diagnosis not present

## 2015-03-07 DIAGNOSIS — C9142 Hairy cell leukemia, in relapse: Secondary | ICD-10-CM | POA: Diagnosis not present

## 2015-03-08 DIAGNOSIS — D709 Neutropenia, unspecified: Secondary | ICD-10-CM | POA: Diagnosis not present

## 2015-03-08 DIAGNOSIS — R112 Nausea with vomiting, unspecified: Secondary | ICD-10-CM | POA: Diagnosis not present

## 2015-03-08 DIAGNOSIS — Z5111 Encounter for antineoplastic chemotherapy: Secondary | ICD-10-CM | POA: Diagnosis not present

## 2015-03-08 DIAGNOSIS — C9142 Hairy cell leukemia, in relapse: Secondary | ICD-10-CM | POA: Diagnosis not present

## 2015-03-08 DIAGNOSIS — Z951 Presence of aortocoronary bypass graft: Secondary | ICD-10-CM | POA: Diagnosis not present

## 2015-03-08 DIAGNOSIS — D61818 Other pancytopenia: Secondary | ICD-10-CM | POA: Diagnosis not present

## 2015-03-08 DIAGNOSIS — I2510 Atherosclerotic heart disease of native coronary artery without angina pectoris: Secondary | ICD-10-CM | POA: Diagnosis not present

## 2015-03-08 DIAGNOSIS — K5900 Constipation, unspecified: Secondary | ICD-10-CM | POA: Diagnosis not present

## 2015-03-08 DIAGNOSIS — Z79899 Other long term (current) drug therapy: Secondary | ICD-10-CM | POA: Diagnosis not present

## 2015-03-08 DIAGNOSIS — Z5181 Encounter for therapeutic drug level monitoring: Secondary | ICD-10-CM | POA: Diagnosis not present

## 2015-03-08 DIAGNOSIS — R252 Cramp and spasm: Secondary | ICD-10-CM | POA: Diagnosis not present

## 2015-03-22 DIAGNOSIS — H353131 Nonexudative age-related macular degeneration, bilateral, early dry stage: Secondary | ICD-10-CM | POA: Diagnosis not present

## 2015-03-22 DIAGNOSIS — H401132 Primary open-angle glaucoma, bilateral, moderate stage: Secondary | ICD-10-CM | POA: Diagnosis not present

## 2015-03-28 DIAGNOSIS — C9142 Hairy cell leukemia, in relapse: Secondary | ICD-10-CM | POA: Diagnosis not present

## 2015-03-29 DIAGNOSIS — R110 Nausea: Secondary | ICD-10-CM | POA: Diagnosis not present

## 2015-03-29 DIAGNOSIS — Z79899 Other long term (current) drug therapy: Secondary | ICD-10-CM | POA: Diagnosis not present

## 2015-03-29 DIAGNOSIS — C9142 Hairy cell leukemia, in relapse: Secondary | ICD-10-CM | POA: Diagnosis not present

## 2015-03-29 DIAGNOSIS — Z5111 Encounter for antineoplastic chemotherapy: Secondary | ICD-10-CM | POA: Diagnosis not present

## 2015-03-29 DIAGNOSIS — K5900 Constipation, unspecified: Secondary | ICD-10-CM | POA: Diagnosis not present

## 2015-03-29 DIAGNOSIS — Z5181 Encounter for therapeutic drug level monitoring: Secondary | ICD-10-CM | POA: Diagnosis not present

## 2015-03-29 DIAGNOSIS — D61818 Other pancytopenia: Secondary | ICD-10-CM | POA: Diagnosis not present

## 2015-04-18 DIAGNOSIS — C9142 Hairy cell leukemia, in relapse: Secondary | ICD-10-CM | POA: Diagnosis not present

## 2015-04-19 DIAGNOSIS — R112 Nausea with vomiting, unspecified: Secondary | ICD-10-CM | POA: Diagnosis not present

## 2015-04-19 DIAGNOSIS — Z5181 Encounter for therapeutic drug level monitoring: Secondary | ICD-10-CM | POA: Diagnosis not present

## 2015-04-19 DIAGNOSIS — K5900 Constipation, unspecified: Secondary | ICD-10-CM | POA: Diagnosis not present

## 2015-04-19 DIAGNOSIS — C9142 Hairy cell leukemia, in relapse: Secondary | ICD-10-CM | POA: Diagnosis not present

## 2015-04-19 DIAGNOSIS — Z5111 Encounter for antineoplastic chemotherapy: Secondary | ICD-10-CM | POA: Diagnosis not present

## 2015-04-19 DIAGNOSIS — Z79899 Other long term (current) drug therapy: Secondary | ICD-10-CM | POA: Diagnosis not present

## 2015-04-19 DIAGNOSIS — R42 Dizziness and giddiness: Secondary | ICD-10-CM | POA: Diagnosis not present

## 2015-04-19 DIAGNOSIS — D61818 Other pancytopenia: Secondary | ICD-10-CM | POA: Diagnosis not present

## 2015-04-19 DIAGNOSIS — M2550 Pain in unspecified joint: Secondary | ICD-10-CM | POA: Diagnosis not present

## 2015-04-25 ENCOUNTER — Ambulatory Visit (INDEPENDENT_AMBULATORY_CARE_PROVIDER_SITE_OTHER): Payer: Medicare Other | Admitting: Family Medicine

## 2015-04-25 ENCOUNTER — Encounter: Payer: Self-pay | Admitting: Family Medicine

## 2015-04-25 DIAGNOSIS — H8110 Benign paroxysmal vertigo, unspecified ear: Secondary | ICD-10-CM

## 2015-04-25 DIAGNOSIS — K219 Gastro-esophageal reflux disease without esophagitis: Secondary | ICD-10-CM | POA: Insufficient documentation

## 2015-04-25 MED ORDER — MECLIZINE HCL 12.5 MG PO TABS: 12.5000 mg | ORAL_TABLET | Freq: Three times a day (TID) | ORAL | Status: DC | PRN

## 2015-04-25 MED ORDER — LOSARTAN POTASSIUM 25 MG PO TABS: 25.0000 mg | ORAL_TABLET | Freq: Every day | ORAL | Status: DC

## 2015-04-25 NOTE — Patient Instructions (Signed)
Reduce your losartan to 25 mg (can cut in 1/2 for now but I also sent in 25mg  pill to [facility]). Your orthostatic vital signs did not show that your blood pressure gets too low with position change though.   Can also use meclizine up to 3x a day to reduce dizziness (this is a bandaid and not a longterm solution)  We will call you within a week about your referral to physical therapy for vestibular rehab. This is the treatment for your issue  If you have new or worsening symptoms please return to see [place]. I am also happy to see you back within a month if you do not see improvement with the rehab after a few sessions.    I do not think this is related to your chemotherapy.   Benign Positional Vertigo Vertigo is the feeling that you or your surroundings are moving when they are not. Benign positional vertigo is the most common form of vertigo. The cause of this condition is not serious (is benign). This condition is triggered by certain movements and positions (is positional). This condition can be dangerous if it occurs while you are doing something that could endanger you or others, such as driving.  CAUSES In many cases, the cause of this condition is not known. It may be caused by a disturbance in an area of the inner ear that helps your brain to sense movement and balance. This disturbance can be caused by a viral infection (labyrinthitis), head injury, or repetitive motion. RISK FACTORS This condition is more likely to develop in:  Women.  People who are 27 years of age or older. SYMPTOMS Symptoms of this condition usually happen when you move your head or your eyes in different directions. Symptoms may start suddenly, and they usually last for less than a minute. Symptoms may include:  Loss of balance and falling.  Feeling like you are spinning or moving.  Feeling like your surroundings are spinning or moving.  Nausea and vomiting.  Blurred vision.  Dizziness.  Involuntary eye  movement (nystagmus). Symptoms can be mild and cause only slight annoyance, or they can be severe and interfere with daily life. Episodes of benign positional vertigo may return (recur) over time, and they may be triggered by certain movements. Symptoms may improve over time. DIAGNOSIS This condition is usually diagnosed by medical history and a physical exam of the head, neck, and ears. You may be referred to a health care provider who specializes in ear, nose, and throat (ENT) problems (otolaryngologist) or a provider who specializes in disorders of the nervous system (neurologist). You may have additional testing, including:  MRI.  A CT scan.  Eye movement tests. Your health care provider may ask you to change positions quickly while he or she watches you for symptoms of benign positional vertigo, such as nystagmus. Eye movement may be tested with an electronystagmogram (ENG), caloric stimulation, the Dix-Hallpike test, or the roll test.  An electroencephalogram (EEG). This records electrical activity in your brain.  Hearing tests. TREATMENT Usually, your health care provider will treat this by moving your head in specific positions to adjust your inner ear back to normal. Surgery may be needed in severe cases, but this is rare. In some cases, benign positional vertigo may resolve on its own in 2-4 weeks. HOME CARE INSTRUCTIONS Safety  Move slowly.Avoid sudden body or head movements.  Avoid driving.  Avoid operating heavy machinery.  Avoid doing any tasks that would be dangerous to you  or others if a vertigo episode would occur.  If you have trouble walking or keeping your balance, try using a cane for stability. If you feel dizzy or unstable, sit down right away.  Return to your normal activities as told by your health care provider. Ask your health care provider what activities are safe for you. General Instructions  Take over-the-counter and prescription medicines only as told  by your health care provider.  Avoid certain positions or movements as told by your health care provider.  Drink enough fluid to keep your urine clear or pale yellow.  Keep all follow-up visits as told by your health care provider. This is important. SEEK MEDICAL CARE IF:  You have a fever.  Your condition gets worse or you develop new symptoms.  Your family or friends notice any behavioral changes.  Your nausea or vomiting gets worse.  You have numbness or a "pins and needles" sensation. SEEK IMMEDIATE MEDICAL CARE IF:  You have difficulty speaking or moving.  You are always dizzy.  You faint.  You develop severe headaches.  You have weakness in your legs or arms.  You have changes in your hearing or vision.  You develop a stiff neck.  You develop sensitivity to light.   This information is not intended to replace advice given to you by your health care provider. Make sure you discuss any questions you have with your health care provider.   Document Released: 10/07/2005 Document Revised: 09/20/2014 Document Reviewed: 04/24/2014 Elsevier Interactive Patient Education [employer].

## 2015-04-25 NOTE — Progress Notes (Signed)
Garret Reddish, MD  Subjective:  [name] is a 81 y.o. year old very pleasant male patient who presents for/with See problem oriented charting ROS- No facial or extremity weakness. No slurred words or trouble swallowing. no blurry vision or double vision. No paresthesias. No confusion or word finding difficulties.   Past Medical History-  Patient Active Problem List   Diagnosis Date Noted  . 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 Coronary artery disease     Priority: High  . GERD (gastroesophageal reflux disease) 04/25/2015    Priority: Medium  . IBS (irritable bowel syndrome) 07/21/2012    Priority: Medium  . Hypertension     Priority: Medium  . Hypercholesterolemia 04/20/2006    Priority: Medium  . Muscle cramping 01/23/2015    Priority: Low  . Knee pain, left 12/20/2013  . Infection of hand due to bite 03/25/2012  . Pancytopenia ([place]) 03/25/2012  . Raynaud's phenomenon 02/10/2012  . Bladder neck contracture 04/30/2011  . Tremor 12/20/2010  . PERSONAL HISTORY OF COLONIC POLYPS 12/15/2007  . EXTERNAL HEMORRHOIDS 12/14/2007  . ANXIETY STATE NOS 11/12/2006    Medications- reviewed and updated Current Outpatient Prescriptions  Medication Sig Dispense Refill  . aspirin 81 MG tablet Take 81 mg by mouth daily.    [name] FOLIC ACID PO Take [id number] mcg by mouth daily.     . isosorbide mononitrate (IMDUR) 60 MG 24 hr tablet Take 1 tablet (60 mg total) by mouth daily. 90 tablet 2  . latanoprost (XALATAN) 0.005 % ophthalmic solution Place 1 drop into both eyes at bedtime.     [name] losartan (COZAAR) 25 MG tablet Take 1 tablet (25 mg total) by mouth daily. 90 tablet 3  . LUTEIN PO Take 1 tablet by mouth daily.    . meclizine (ANTIVERT) 12.5 MG tablet Take 1 tablet (12.5 mg total) by mouth 3 (three) times daily as needed for dizziness. 30 tablet 0  . metoprolol succinate (TOPROL-XL) 50 MG 24 hr tablet Take 1 tablet (50 mg total) by mouth every evening. 90 tablet 2  .  nitroGLYCERIN (NITROSTAT) 0.4 MG SL tablet Place 1 tablet (0.4 mg total) under the tongue every 5 (five) minutes as needed for chest pain (x 3 doses max). (Patient not taking: Reported on 02/13/2015) 25 tablet 3  . omeprazole (PRILOSEC) 20 MG capsule Take 1 capsule (20 mg total) by mouth every evening. 90 capsule 2  . simvastatin (ZOCOR) 20 MG tablet Take 1 tablet (20 mg total) by mouth every evening. 90 tablet 2   No current facility-administered medications for this visit.    Objective: BP 120/78 mmHg  Pulse 71  Temp(Src) 97.4 F (36.3 C)  Wt 206 lb (93.441 kg) Gen: NAD, resting comfortably CV: RRR no murmurs rubs or gallops Lungs: CTAB no crackles, wheeze, rhonchi Abdomen: soft/nontender/nondistended/normal bowel sounds. No rebound or guarding.  Ext: no edema Skin: warm, dry Neuro: CN II-XII intact, sensation and reflexes normal throughout, 5/5 muscle strength in bilateral upper and lower extremities. Normal finger to nose. Normal rapid alternating movements. No pronator drift. Normal romberg. Normal gait.   Dix hallpike to right produced vertigo and nystagmus  Assessment/Plan:  Vertigo S: issue with room spinning for several months. Worse in last week. When he lies down and gets up his head starts spinning or from sitting to standing can occur. Can happen turning over in bed but almost always with head movement. . Usually if makes slow movements dose not  occur- Better if gets up slower. If sits on side of bed for a minute before getting up helps as well.  Has had some nausea with it at times. Episodes tend to last up to an hour of severe vertigo.   Shows me recent stable labs [facility] for [facility] treatment. hee wanted to make sure not related to his chemo drugs. Stated his oncologist did not think so but wondered if Bp was too low. Chemo: pintostatin, n[place], retauxin Wbc 1.8 k hgb 12.2 Plat 15k  A/P:  81 year old male with known CAD presenting with vertigo with positive dix  hallpike with nystagmus as well as recurrent issues with head motion. Very high likelihood BPPV. Neuro exam reassuring. Will send for vestibular rehab as well as give meclizine. His orthostatic BPs were 110/70 laying with HR 69, sitting 118/70 with HR 67 and standing 140/80 with HR 76- does not appear orthostatic but wonder if may tolerate slightly less BP medication better so reduce losartan to 25mg . Forwarded this change to Dr. Martinique at patient request.    Has follow up with me to establish care within a month. Return precautions advised.   Orders Placed This Encounter  Procedures  . Ambulatory referral to Physical Therapy    Referral Priority:  Routine    Referral Type:  Physical Medicine    Referral Reason:  Specialty Services Required    Requested Specialty:  Physical Therapy    Number of Visits Requested:  1    Meds ordered this encounter  Medications  . losartan (COZAAR) 25 MG tablet    Sig: Take 1 tablet (25 mg total) by mouth daily.    Dispense:  90 tablet    Refill:  3  . meclizine (ANTIVERT) 12.5 MG tablet    Sig: Take 1 tablet (12.5 mg total) by mouth 3 (three) times daily as needed for dizziness.    Dispense:  30 tablet    Refill:  0  high risk patient with age and CAD. Stroke or TIA in differential but with exam findings thought less likely  The duration of face-to-face time during this visit was 25 minutes. Greater than 50% of this time was spent in counseling, explanation of diagnosis, planning of further management, and/or coordination of care.

## 2015-05-02 DIAGNOSIS — C9142 Hairy cell leukemia, in relapse: Secondary | ICD-10-CM | POA: Diagnosis not present

## 2015-05-03 DIAGNOSIS — Z5181 Encounter for therapeutic drug level monitoring: Secondary | ICD-10-CM | POA: Diagnosis not present

## 2015-05-03 DIAGNOSIS — Z79899 Other long term (current) drug therapy: Secondary | ICD-10-CM | POA: Diagnosis not present

## 2015-05-03 DIAGNOSIS — Z951 Presence of aortocoronary bypass graft: Secondary | ICD-10-CM | POA: Diagnosis not present

## 2015-05-03 DIAGNOSIS — K5900 Constipation, unspecified: Secondary | ICD-10-CM | POA: Diagnosis not present

## 2015-05-03 DIAGNOSIS — C9142 Hairy cell leukemia, in relapse: Secondary | ICD-10-CM | POA: Diagnosis not present

## 2015-05-03 DIAGNOSIS — Z5111 Encounter for antineoplastic chemotherapy: Secondary | ICD-10-CM | POA: Diagnosis not present

## 2015-05-03 DIAGNOSIS — I2510 Atherosclerotic heart disease of native coronary artery without angina pectoris: Secondary | ICD-10-CM | POA: Diagnosis not present

## 2015-05-03 DIAGNOSIS — R42 Dizziness and giddiness: Secondary | ICD-10-CM | POA: Diagnosis not present

## 2015-05-03 DIAGNOSIS — D61818 Other pancytopenia: Secondary | ICD-10-CM | POA: Diagnosis not present

## 2015-05-11 ENCOUNTER — Ambulatory Visit: Payer: Medicare Other | Admitting: Rehabilitative and Restorative Service Providers"

## 2015-05-15 ENCOUNTER — Encounter: Payer: Self-pay | Admitting: Family Medicine

## 2015-05-15 ENCOUNTER — Ambulatory Visit (INDEPENDENT_AMBULATORY_CARE_PROVIDER_SITE_OTHER): Payer: Medicare Other | Admitting: Family Medicine

## 2015-05-15 VITALS — BP 130/66 | HR 80 | Temp 98.5°F | Wt 210.0 lb

## 2015-05-15 DIAGNOSIS — Z8546 Personal history of malignant neoplasm of prostate: Secondary | ICD-10-CM | POA: Insufficient documentation

## 2015-05-15 DIAGNOSIS — G250 Essential tremor: Secondary | ICD-10-CM

## 2015-05-15 DIAGNOSIS — Z86010 Personal history of colonic polyps: Secondary | ICD-10-CM

## 2015-05-15 DIAGNOSIS — R739 Hyperglycemia, unspecified: Secondary | ICD-10-CM | POA: Insufficient documentation

## 2015-05-15 DIAGNOSIS — Z0001 Encounter for general adult medical examination with abnormal findings: Secondary | ICD-10-CM

## 2015-05-15 DIAGNOSIS — D61818 Other pancytopenia: Secondary | ICD-10-CM | POA: Diagnosis not present

## 2015-05-15 DIAGNOSIS — R6889 Other general symptoms and signs: Secondary | ICD-10-CM

## 2015-05-15 DIAGNOSIS — I10 Essential (primary) hypertension: Secondary | ICD-10-CM

## 2015-05-15 NOTE — Assessment & Plan Note (Signed)
Sugar 186 on labs at [facility]. We held off on testing due to anemia and ongoing chemotherapy and unclear how this would affect a1c- will get at 4 month visit

## 2015-05-15 NOTE — Assessment & Plan Note (Signed)
S: controlled. On imdur 60mg , losartan 25mg  (recent decrease from 50mg )- fewer orthostatic issues BP Readings from Last 3 Encounters:  05/15/15 130/66  04/25/15 120/78  02/13/15 128/54  A/P:Continue current meds:  Doing well

## 2015-05-15 NOTE — Patient Instructions (Addendum)
Glad you are doing well (under the circumstances). Glad to hear you only have a few more sessions yet of chemotherapy  I am concerned you may have diabetes but want to wait to test until you are not on chemotherapy.   I think the spot on your forehead could be a precancer- consider freezing at follow up  Blood pressure looks great on lower losartan dose  Hold off on shots until after chemotherapy and when infection fighting cells are back to normal Health Maintenance Due  Topic Date Due  . ZOSTAVAX  04/04/1996  . PNA vac Low Risk Adult (2 of 2 - PCV13) 10/17/2008

## 2015-05-15 NOTE — Assessment & Plan Note (Signed)
S: Patient has had intention tremor at least since 2012, better with alcohol, worse with fatigue. Not particularly worsening A/P: discussed diagnosis- he needed reinforcement though review of records shows discussed before. Already on beta blocker- do not think we have room to adjust dose with HR

## 2015-05-15 NOTE — Progress Notes (Signed)
Phone: [phone]  Subjective:  Patient presents today for their annual physical. Chief complaint-noted.   See problem oriented charting- ROS- full  review of systems was completed and negative except for: fatigue especially with chemotherapy, several skin lesions he wanted me to examine. No chest pain or shortness of breath  The following were reviewed and entered/updated in epic: Past Medical History  Diagnosis Date  . GERD (gastroesophageal reflux disease)   . Hypertension   . Hyperlipidemia   . Insomnia   . Hypertriglyceridemia   . History of atherosclerotic cardiovascular disease   . Bladder neck contracture   . Stable angina (HCC)   . History of myocardial infarction 2006   S/P CABG  . S/P CABG x 4 2006  . Coronary artery disease CARDIOLOGIST- DR Martinique-  LAST VISIT NOTE 12-20-2010 IN EPIC AND W/ CHART    subendocranial MI; left inter. mamm to LAD, saph vein to diag; saph vein to distal circ; saph vein to 1st obtuse  . Arthritis   . H/O hiatal hernia   . History of kidney stones   . Glaucoma BILATERAL  . Cataract immature BILATERAL  . Frequency of urination   . Urgency of urination   . Nocturia   . History of prostate cancer S/P PROSTATECTOMY-  NO RECURRENCE    FOLLOWED BY DR Risa Grill  . Macular degeneration   . Raynaud's phenomenon   . Right arm cellulitis 04/02/12    post cat bite  . 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 DIVERTICULOSIS, COLON 09/05/2003    Qualifier: Diagnosis of  By: Nolon Rod CMA (AAMA), Robin    . EUSTACHIAN TUBE DYSFUNCTION, BILATERAL 06/22/2008    Qualifier: Diagnosis of  By: Linna Darner MD, [place] 12/14/2007    Qualifier: Diagnosis of  By: Nolon Rod CMA Deborra Medina), Robin     Patient Active Problem List   Diagnosis Date Noted  . 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 Pancytopenia ([place]) 03/25/2012    Priority: High  . Coronary artery disease     Priority: High  . History of prostate cancer 05/15/2015   Priority: Medium  . Hyperglycemia 05/15/2015    Priority: Medium  . GERD (gastroesophageal reflux disease) 04/25/2015    Priority: Medium  . IBS (irritable bowel syndrome) 07/21/2012    Priority: Medium  . Hypertension     Priority: Medium  . Hypercholesterolemia 04/20/2006    Priority: Medium  . Muscle cramping 01/23/2015    Priority: Low  . Raynaud's phenomenon 02/10/2012    Priority: Low  . Essential tremor 12/20/2010    Priority: Low  . Personal history of colonic polyps 12/15/2007    Priority: Low   Past Surgical History  Procedure Laterality Date  . Debridement tennis elbow  2002    RIGHT; [facility]  . Robot assisted laparoscopic radical prostatectomy  05-11-2006  . Cardiac catheterization  07-30-2006  DR Martinique    POST CABG / OCCLUDED DIAGONAL &  FOURTH OBTUSE MARGINAL GRAFTS/ NORMAL LVF/ PATENT LIMA TO LAD & SEPHENOUS VEIN GRAFT TO 1ST OBTUSE MARGINAL   . Cardiac catheterization  04-23-2004    CRITICAL THREE-VESSEL CAD/ PRESERVED LVF  . Laparoscopic cholecystectomy  02-05-2005  . Coronary artery bypass graft  04-25-2004  X4 VESSELS    left inter. mamm to LAD, saph vein to diag; saph vein to distal circ; saph vein to 1st obtuse  . Cystoscopy  04/30/2011    Procedure: CYSTOSCOPY;  Surgeon: Shanon Brow  Cy Blamer, MD;  Location: [facility];  Service: Urology;  Laterality: N/A;  30 MINUTES   . Balloon dilation  04/30/2011    Procedure: BALLOON DILATION;  Surgeon: Bernestine Amass, MD;  Location: [facility];  Service: Urology;  Laterality: N/A;    Family History  Problem Relation Age of Onset  . Heart disease Sister     CABGX 1 sister and stents X1 sister  . Cancer Sister   . Parkinsonism Father   . Diabetes Neg Hx   . Stroke Neg Hx     Medications- reviewed and updated Current Outpatient Prescriptions  Medication Sig Dispense Refill  . aspirin 81 MG tablet Take 81 mg by mouth daily.    [name] FOLIC ACID PO Take 811 mcg by mouth daily.     .  isosorbide mononitrate (IMDUR) 60 MG 24 hr tablet Take 1 tablet (60 mg total) by mouth daily. 90 tablet 2  . latanoprost (XALATAN) 0.005 % ophthalmic solution Place 1 drop into both eyes at bedtime.     [name] losartan (COZAAR) 25 MG tablet Take 1 tablet (25 mg total) by mouth daily. 90 tablet 3  . LUTEIN PO Take 1 tablet by mouth daily.    . metoprolol succinate (TOPROL-XL) 50 MG 24 hr tablet Take 1 tablet (50 mg total) by mouth every evening. 90 tablet 2  . omeprazole (PRILOSEC) 20 MG capsule Take 1 capsule (20 mg total) by mouth every evening. 90 capsule 2  . simvastatin (ZOCOR) 20 MG tablet Take 1 tablet (20 mg total) by mouth every evening. 90 tablet 2  . nitroGLYCERIN (NITROSTAT) 0.4 MG SL tablet Place 1 tablet (0.4 mg total) under the tongue every 5 (five) minutes as needed for chest pain (x 3 doses max). (Patient not taking: Reported on 02/13/2015) 25 tablet 3   No current facility-administered medications for this visit.    Allergies-reviewed and updated Allergies  Allergen Reactions  . Clindamycin Other (See Comments)    Unknown reaction from childhood    Social History   Social History  . Marital Status: Married    Spouse Name: N/A  . Number of Children: 2  . Years of Education: N/A   Occupational History  . [employer]    Social History Main Topics  . Smoking status: Former Smoker -- 3 years    Types: Cigarettes, Cigars  . Smokeless tobacco: Never Used     Comment: [place] AND QUIT OCCASIONAL CIGAR  2007  . Alcohol Use: 8.4 oz/week    7 Glasses of wine, 7 Shots of liquor per week  . Drug Use: No  . Sexual Activity: Not Asked   Other Topics Concern  . None   Social History Narrative    ROS--See HPI   Objective: BP 130/66 mmHg  Pulse 80  Temp(Src) 98.5 F (36.9 C)  Wt 210 lb (95.255 kg) Gen: NAD, resting comfortably HEENT: Mucous membranes are moist. Oropharynx normal Neck: no thyromegaly CV: RRR no murmurs rubs or  gallops Lungs: CTAB no crackles, wheeze, rhonchi Abdomen: soft/nontender/nondistended/normal bowel sounds. No rebound or guarding.  Ext: trace edema Skin: warm, dry Neuro: grossly normal, moves all extremities, PERRLA  Assessment/Plan:  81 y.o. male presenting for annual physical.  Health Maintenance counseling: 1. Anticipatory guidance: Patient counseled regarding regular dental exams, eye exams, wearing seatbelts.  2. Risk factor reduction:  Advised patient of need for regular exercise and diet rich and fruits and vegetables to reduce risk  of heart attack and stroke. Active in yard, limited by chemotherapy.  3. Immunizations/screenings/ancillary studies Immunization History  Administered Date(s) Administered  . Influenza Whole 10/23/2008, 10/12/2009  . Pneumococcal Polysaccharide-23 10/18/2007  . Td 03/25/2012   Health Maintenance Due  Topic Date Due  . ZOSTAVAX - on chemo, defer 04/04/1996  . PNA vac Low Risk Adult (2 of 2 - PCV13)- on chemo, defer 10/17/2008   4. Prostate cancer screening- history prostate cancer, follows with Dr. Risa Grill yearly   5. Colon cancer screening - 01/25/2008 with 7 year follow up due to prior history adenoma. We deferred until next physical given undergoing chemotherapy  We discussed prior vertigo/BPPV- resolved without intervention.   He has an ak forehead, SK behind right ear  Where glasses are and gets irritated. Considered cryotherapy but will hold off until WBC count increases post chemotherapy.   Discussed fatigue could be related to hemoglobin near 9- following up with [facility] next week  Hyperlipidemia=At goal last check on simvastatin 68m with last LDL 59  Pancytopenia (HCC) S: due to hairy cell leukemia- [facility] 2 more sessions of chemo then a month out will get bone marrow biopsy A/P: we are going to change our approach to immunizations and treatment today. Will hold off on immunizations until after  chemotherapy. No cryotherapy today for AK on scalp due to concern of infection when blistering.    Essential tremor S: Patient has had intention tremor at least since 2012, better with alcohol, worse with fatigue. Not particularly worsening A/P: discussed diagnosis- he needed reinforcement though review of records shows discussed before. Already on beta blocker- do not think we have room to adjust dose with HR   Hypertension S: controlled. On imdur 624m losartan 2540mrecent decrease from 44m81mfewer orthostatic issues BP Readings from Last 3 Encounters:  05/15/15 130/66  04/25/15 120/78  02/13/15 128/54  A/P:Continue current meds:  Doing well  Hyperglycemia Sugar 186 on labs at [facility]. We held off on testing due to anemia and ongoing chemotherapy and unclear how this would affect a1c- will get at 4 month visit    Return in about 4 months (around 09/15/2015). Return precautions advised.   StepGarret Reddish

## 2015-05-15 NOTE — Assessment & Plan Note (Signed)
S: due to hairy cell leukemia- undergoing treatment at [facility]. 2 more sessions of chemo then a month out will get bone marrow biopsy A/P: we are going to change our approach to immunizations and treatment today. Will hold off on immunizations until after chemotherapy. No cryotherapy today for AK on scalp due to concern of infection when blistering.

## 2015-05-16 DIAGNOSIS — C9142 Hairy cell leukemia, in relapse: Secondary | ICD-10-CM | POA: Diagnosis not present

## 2015-05-17 DIAGNOSIS — C9140 Hairy cell leukemia not having achieved remission: Secondary | ICD-10-CM | POA: Diagnosis not present

## 2015-05-17 DIAGNOSIS — C9142 Hairy cell leukemia, in relapse: Secondary | ICD-10-CM | POA: Diagnosis not present

## 2015-05-17 DIAGNOSIS — R42 Dizziness and giddiness: Secondary | ICD-10-CM | POA: Diagnosis not present

## 2015-05-17 DIAGNOSIS — K5900 Constipation, unspecified: Secondary | ICD-10-CM | POA: Diagnosis not present

## 2015-05-17 DIAGNOSIS — M2550 Pain in unspecified joint: Secondary | ICD-10-CM | POA: Diagnosis not present

## 2015-05-17 DIAGNOSIS — R05 Cough: Secondary | ICD-10-CM | POA: Diagnosis not present

## 2015-05-17 DIAGNOSIS — I2510 Atherosclerotic heart disease of native coronary artery without angina pectoris: Secondary | ICD-10-CM | POA: Diagnosis not present

## 2015-05-17 DIAGNOSIS — Z951 Presence of aortocoronary bypass graft: Secondary | ICD-10-CM | POA: Diagnosis not present

## 2015-05-17 DIAGNOSIS — Z7982 Long term (current) use of aspirin: Secondary | ICD-10-CM | POA: Diagnosis not present

## 2015-05-17 DIAGNOSIS — Z9889 Other specified postprocedural states: Secondary | ICD-10-CM | POA: Diagnosis not present

## 2015-05-30 DIAGNOSIS — C9142 Hairy cell leukemia, in relapse: Secondary | ICD-10-CM | POA: Diagnosis not present

## 2015-05-31 DIAGNOSIS — M2550 Pain in unspecified joint: Secondary | ICD-10-CM | POA: Diagnosis not present

## 2015-05-31 DIAGNOSIS — K5900 Constipation, unspecified: Secondary | ICD-10-CM | POA: Diagnosis not present

## 2015-05-31 DIAGNOSIS — R05 Cough: Secondary | ICD-10-CM | POA: Diagnosis not present

## 2015-05-31 DIAGNOSIS — Z951 Presence of aortocoronary bypass graft: Secondary | ICD-10-CM | POA: Diagnosis not present

## 2015-05-31 DIAGNOSIS — I2510 Atherosclerotic heart disease of native coronary artery without angina pectoris: Secondary | ICD-10-CM | POA: Diagnosis not present

## 2015-05-31 DIAGNOSIS — Z7982 Long term (current) use of aspirin: Secondary | ICD-10-CM | POA: Diagnosis not present

## 2015-05-31 DIAGNOSIS — R42 Dizziness and giddiness: Secondary | ICD-10-CM | POA: Diagnosis not present

## 2015-05-31 DIAGNOSIS — C9142 Hairy cell leukemia, in relapse: Secondary | ICD-10-CM | POA: Diagnosis not present

## 2015-05-31 DIAGNOSIS — Z5112 Encounter for antineoplastic immunotherapy: Secondary | ICD-10-CM | POA: Diagnosis not present

## 2015-05-31 DIAGNOSIS — Z79899 Other long term (current) drug therapy: Secondary | ICD-10-CM | POA: Diagnosis not present

## 2015-06-27 DIAGNOSIS — C9142 Hairy cell leukemia, in relapse: Secondary | ICD-10-CM | POA: Diagnosis not present

## 2015-06-28 DIAGNOSIS — Z951 Presence of aortocoronary bypass graft: Secondary | ICD-10-CM | POA: Diagnosis not present

## 2015-06-28 DIAGNOSIS — C9140 Hairy cell leukemia not having achieved remission: Secondary | ICD-10-CM | POA: Diagnosis not present

## 2015-06-28 DIAGNOSIS — Z79899 Other long term (current) drug therapy: Secondary | ICD-10-CM | POA: Diagnosis not present

## 2015-06-28 DIAGNOSIS — D61818 Other pancytopenia: Secondary | ICD-10-CM | POA: Diagnosis not present

## 2015-06-28 DIAGNOSIS — Z7982 Long term (current) use of aspirin: Secondary | ICD-10-CM | POA: Diagnosis not present

## 2015-06-28 DIAGNOSIS — R21 Rash and other nonspecific skin eruption: Secondary | ICD-10-CM | POA: Diagnosis not present

## 2015-06-28 DIAGNOSIS — D72819 Decreased white blood cell count, unspecified: Secondary | ICD-10-CM | POA: Diagnosis not present

## 2015-06-28 DIAGNOSIS — C9141 Hairy cell leukemia, in remission: Secondary | ICD-10-CM | POA: Diagnosis not present

## 2015-06-28 DIAGNOSIS — D709 Neutropenia, unspecified: Secondary | ICD-10-CM | POA: Diagnosis not present

## 2015-06-28 DIAGNOSIS — M2550 Pain in unspecified joint: Secondary | ICD-10-CM | POA: Diagnosis not present

## 2015-06-28 DIAGNOSIS — I2510 Atherosclerotic heart disease of native coronary artery without angina pectoris: Secondary | ICD-10-CM | POA: Diagnosis not present

## 2015-06-29 DIAGNOSIS — C9140 Hairy cell leukemia not having achieved remission: Secondary | ICD-10-CM | POA: Diagnosis not present

## 2015-07-03 ENCOUNTER — Ambulatory Visit (INDEPENDENT_AMBULATORY_CARE_PROVIDER_SITE_OTHER): Payer: Medicare Other | Admitting: Family Medicine

## 2015-07-03 ENCOUNTER — Encounter: Payer: Self-pay | Admitting: Family Medicine

## 2015-07-03 VITALS — BP 124/78 | HR 87 | Temp 97.8°F | Ht 70.0 in | Wt 205.0 lb

## 2015-07-03 DIAGNOSIS — R21 Rash and other nonspecific skin eruption: Secondary | ICD-10-CM

## 2015-07-03 MED ORDER — PREDNISONE 20 MG PO TABS: ORAL_TABLET | ORAL | Status: DC

## 2015-07-03 NOTE — Progress Notes (Signed)
Pre visit review using our clinic review tool, if applicable. No additional management support is needed unless otherwise documented below in the visit note. 

## 2015-07-03 NOTE — Patient Instructions (Signed)
Unclear cause for rash  Steroid pill may get rid of rash but should help with the itching at least  Update me in 7 days- if not improving or worsens before then we will refer to dermatology

## 2015-07-03 NOTE — Progress Notes (Signed)
Subjective:  [name] is a 81 y.o. year old very pleasant male patient who presents for/with See problem oriented charting ROS- no headache, blurry vision, chest pain.see any ROS included in HPI as well.   Past Medical History-  Patient Active Problem List   Diagnosis Date Noted  . Hairy cell leukemia (HCC) 04/01/2012    Priority: High  . Pancytopenia (HCC) 03/25/2012    Priority: High  . Coronary artery disease     Priority: High  . History of prostate cancer 05/15/2015    Priority: Medium  . Hyperglycemia 05/15/2015    Priority: Medium  . GERD (gastroesophageal reflux disease) 04/25/2015    Priority: Medium  . IBS (irritable bowel syndrome) 07/21/2012    Priority: Medium  . Hypertension     Priority: Medium  . Hypercholesterolemia 04/20/2006    Priority: Medium  . Muscle cramping 01/23/2015    Priority: Low  . Raynaud's phenomenon 02/10/2012    Priority: Low  . Essential tremor 12/20/2010    Priority: Low  . Personal history of colonic polyps 12/15/2007    Priority: Low    Medications- reviewed and updated Current Outpatient Prescriptions  Medication Sig Dispense Refill  . aspirin 81 MG tablet Take 81 mg by mouth daily.    [name] FOLIC ACID PO Take 400 mcg by mouth daily.     . isosorbide mononitrate (IMDUR) 60 MG 24 hr tablet Take 1 tablet (60 mg total) by mouth daily. 90 tablet 2  . latanoprost (XALATAN) 0.005 % ophthalmic solution Place 1 drop into both eyes at bedtime.     . LUTEIN PO Take 1 tablet by mouth daily.    . metoprolol succinate (TOPROL-XL) 50 MG 24 hr tablet Take 1 tablet (50 mg total) by mouth every evening. 90 tablet 2  . omeprazole (PRILOSEC) 20 MG capsule Take 1 capsule (20 mg total) by mouth every evening. 90 capsule 2  . simvastatin (ZOCOR) 20 MG tablet Take 1 tablet (20 mg total) by mouth every evening. 90 tablet 2  . losartan (COZAAR) 25 MG tablet Take 1 tablet (25 mg total) by mouth daily. 90 tablet 3  . nitroGLYCERIN (NITROSTAT) 0.4 MG SL  tablet Place 1 tablet (0.4 mg total) under the tongue every 5 (five) minutes as needed for chest pain (x 3 doses max). (Patient not taking: Reported on 02/13/2015) 25 tablet 3   No current facility-administered medications for this visit.    Objective: BP 124/78 mmHg  Pulse 87  Temp(Src) 97.8 F (36.6 C) (Oral)  Ht 5\' 10"  (1.778 m)  Wt 205 lb (92.987 kg)  BMI 29.41 kg/m2  SpO2 95% Gen: NAD, resting comfortably CV: RRR no murmurs rubs or gallops Lungs: CTAB no crackles, wheeze, rhonchi Abdomen: soft/nontender/nondistended/normal bowel sounds. No rebound or guarding.  Ext: no edema Skin: warm, dry, in antecubital fossas extending up arm 5 cm extending down onto forearm there are erythematous slightly scaled areas some raised some flat. Similar behind knees but with a few plaques. Similar plaque like findings along bilateral waist. In low back- mild erythema with fine scale represented upper back as well.  Neuro: grossly normal, moves all extremities  Assessment/Plan:  Rash S:2 weeks of rash. Largely stable Has tried Eucrin cream which gives minor relief. Rash present on antecubital fossa waist, back of knees, lower back. Very Pruritic. No pain. Finished last round of chemo 3 weeks ago and then had bone marrow biopsy last week. Discussed with oncologist who stated he could take any  medication needed, did nto think chemo related, and referred to me. No new detergents/fabric softeners, clothes, foods  ROS-not ill appearing, no fever/chills. No new medications. Likely somewhat immunocompromised after prior chemotherapy. No mucus membrane involvement.  A/P: unclear etiology- does not think he can spread topical on all areas so will trial prednisone taper- if does not resolve or worsens then refer to dermatology at that point.    Meds ordered this encounter  Medications  . predniSONE (DELTASONE) 20 MG tablet    Sig: Take 2 pills for 3 days, 1 pill for 3 days, 1/2 pill for 3 days, 1/2 pill  every other day until finished    Dispense:  12 tablet    Refill:  0  new acute condition with med management in high risk patient- potentially immunocompromised  Return precautions advised.  Garret Reddish, MD

## 2015-07-11 DIAGNOSIS — C9140 Hairy cell leukemia not having achieved remission: Secondary | ICD-10-CM | POA: Diagnosis not present

## 2015-07-20 DIAGNOSIS — H353132 Nonexudative age-related macular degeneration, bilateral, intermediate dry stage: Secondary | ICD-10-CM | POA: Diagnosis not present

## 2015-07-20 DIAGNOSIS — H401132 Primary open-angle glaucoma, bilateral, moderate stage: Secondary | ICD-10-CM | POA: Diagnosis not present

## 2015-07-31 DIAGNOSIS — L308 Other specified dermatitis: Secondary | ICD-10-CM | POA: Diagnosis not present

## 2015-07-31 DIAGNOSIS — X32XXXA Exposure to sunlight, initial encounter: Secondary | ICD-10-CM | POA: Diagnosis not present

## 2015-07-31 DIAGNOSIS — L570 Actinic keratosis: Secondary | ICD-10-CM | POA: Diagnosis not present

## 2015-08-03 DIAGNOSIS — H401132 Primary open-angle glaucoma, bilateral, moderate stage: Secondary | ICD-10-CM | POA: Diagnosis not present

## 2015-08-03 DIAGNOSIS — H353132 Nonexudative age-related macular degeneration, bilateral, intermediate dry stage: Secondary | ICD-10-CM | POA: Diagnosis not present

## 2015-08-07 ENCOUNTER — Telehealth: Payer: Self-pay | Admitting: Family Medicine

## 2015-08-07 DIAGNOSIS — H34219 Partial retinal artery occlusion, unspecified eye: Secondary | ICD-10-CM

## 2015-08-07 NOTE — Telephone Encounter (Signed)
Received call form optho Dr. Delman Cheadle- patient with Hollenhorst plaque- high association with carotid artery disease- will order duplex . Try to fax results to Dr. Delman Cheadle

## 2015-08-10 ENCOUNTER — Encounter (INDEPENDENT_AMBULATORY_CARE_PROVIDER_SITE_OTHER): Payer: Medicare Other | Admitting: Ophthalmology

## 2015-08-10 DIAGNOSIS — H353124 Nonexudative age-related macular degeneration, left eye, advanced atrophic with subfoveal involvement: Secondary | ICD-10-CM

## 2015-08-10 DIAGNOSIS — I10 Essential (primary) hypertension: Secondary | ICD-10-CM | POA: Diagnosis not present

## 2015-08-10 DIAGNOSIS — H43813 Vitreous degeneration, bilateral: Secondary | ICD-10-CM | POA: Diagnosis not present

## 2015-08-10 DIAGNOSIS — H353211 Exudative age-related macular degeneration, right eye, with active choroidal neovascularization: Secondary | ICD-10-CM

## 2015-08-10 DIAGNOSIS — H35033 Hypertensive retinopathy, bilateral: Secondary | ICD-10-CM | POA: Diagnosis not present

## 2015-08-13 NOTE — Progress Notes (Signed)
[name] Date of Birth: Nov 21, 1934   History of Present Illness: [name] is seen today for followup CAD. He has a history of coronary disease and is status post CABG in 2006.  He had a normal stress Myoview study in February 2014. He has a history of  hairy cell leukemia. He was treated with Cladribine at [facility] completed in 2014. This year he had recurrence and was treated with chemo therapy with Pentostatin and Rituxan. Last BM biopsy in June was normal.    He denies any symptoms of chest pain or SOB.   Activity limited by knee pain. He does complain of fatigue in the afternoon. He recently was diagnosed with a macular hemorrhage. 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 He complains of itching all over and is seeing dermatology.   Current Outpatient Prescriptions on File Prior to Visit  Medication Sig Dispense Refill  . aspirin 81 MG tablet Take 81 mg by mouth daily.    . isosorbide mononitrate (IMDUR) 60 MG 24 hr tablet Take 1 tablet (60 mg total) by mouth daily. 90 tablet 2  . latanoprost (XALATAN) 0.005 % ophthalmic solution Place 1 drop into both eyes at bedtime.     [name] losartan (COZAAR) 25 MG tablet Take 1 tablet (25 mg total) by mouth daily. 90 tablet 3  . LUTEIN PO Take 1 tablet by mouth daily.    . metoprolol succinate (TOPROL-XL) 50 MG 24 hr tablet Take 1 tablet (50 mg total) by mouth every evening. 90 tablet 2  . nitroGLYCERIN (NITROSTAT) 0.4 MG SL tablet Place 1 tablet (0.4 mg total) under the tongue every 5 (five) minutes as needed for chest pain (x 3 doses max). 25 tablet 3  . omeprazole (PRILOSEC) 20 MG capsule Take 1 capsule (20 mg total) by mouth every evening. 90 capsule 2  . simvastatin (ZOCOR) 20 MG tablet Take 1 tablet (20 mg total) by mouth every evening. 90 tablet 2   No current facility-administered medications on file prior to visit.     Allergies  Allergen Reactions  . Clindamycin Other  (See Comments)    Unknown reaction from childhood    Past Medical History:  Diagnosis Date  . Arthritis   . Bladder neck contracture   . Cataract immature BILATERAL  . Coronary artery disease CARDIOLOGIST- DR Martinique-  LAST VISIT NOTE 12-20-2010 IN EPIC AND W/ CHART   subendocranial MI; left inter. mamm to LAD, saph vein to diag; saph vein to distal circ; saph vein to 1st obtuse  . DIVERTICULOSIS, COLON 09/05/2003   Qualifier: Diagnosis of  By: Nolon Rod CMA (AAMA), Robin    . EUSTACHIAN TUBE DYSFUNCTION, BILATERAL 06/22/2008   Qualifier: Diagnosis of  By: Linna Darner MD, William    . EXTERNAL HEMORRHOIDS 12/14/2007   Qualifier: Diagnosis of  By: Nolon Rod CMA (AAMA), Robin    . Frequency of urination   . GERD (gastroesophageal reflux disease)   . Glaucoma BILATERAL  . H/O hiatal hernia   . Hairy cell leukemia ([place])   . History of atherosclerotic cardiovascular disease   . History of kidney stones   . History of myocardial infarction 2006   S/P CABG  . History of prostate cancer S/P PROSTATECTOMY-  NO RECURRENCE   FOLLOWED BY DR Risa Grill  . Hyperlipidemia   . Hypertension   . Hypertriglyceridemia   . Insomnia   . Macular degeneration   . Nocturia   . Raynaud's phenomenon   .  Right arm cellulitis 04/02/12   post cat bite  . S/P CABG x 4 2006  . Stable angina (HCC)   . Urgency of urination     Past Surgical History:  Procedure Laterality Date  . BALLOON DILATION  04/30/2011   Procedure: BALLOON DILATION;  Surgeon: Bernestine Amass, MD;  Location: [facility];  Service: Urology;  Laterality: N/A;  . CARDIAC CATHETERIZATION  07-30-2006  DR Martinique   POST CABG / OCCLUDED DIAGONAL &  FOURTH OBTUSE MARGINAL GRAFTS/ NORMAL LVF/ PATENT LIMA TO LAD & SEPHENOUS VEIN GRAFT TO 1ST OBTUSE MARGINAL   . CARDIAC CATHETERIZATION  04-23-2004   CRITICAL THREE-VESSEL CAD/ PRESERVED LVF  . CORONARY ARTERY BYPASS GRAFT  04-25-2004  X4 VESSELS   left inter. mamm to LAD, saph vein to diag;  saph vein to distal circ; saph vein to 1st obtuse  . CYSTOSCOPY  04/30/2011   Procedure: CYSTOSCOPY;  Surgeon: Bernestine Amass, MD;  Location: [facility];  Service: Urology;  Laterality: N/A;  30 MINUTES   . DEBRIDEMENT TENNIS ELBOW  2002   RIGHT; [facility]  . LAPAROSCOPIC CHOLECYSTECTOMY  02-05-2005  . ROBOT ASSISTED LAPAROSCOPIC RADICAL PROSTATECTOMY  05-11-2006    History  Smoking Status  . Former Smoker  . Years: 3.00  . Types: Cigarettes, Cigars  Smokeless Tobacco  . Never Used    Comment: QUIT SMOKING CIGARETTES 1964 AND QUIT OCCASIONAL CIGAR  2007    History  Alcohol Use  . 8.4 oz/week  . 7 Glasses of wine, 7 Shots of liquor per week    Family History  Problem Relation Age of Onset  . Heart disease Sister     CABGX 1 sister and stents X1 sister  . Cancer Sister   . Parkinsonism Father   . Diabetes Neg Hx   . Stroke Neg Hx     Review of Systems: As noted in history of present illness. All other systems were reviewed and are negative.  Physical Exam: BP 120/80   Pulse 68   Ht '5\' 11"'  (1.803 m)   Wt 204 lb 6.4 oz (92.7 kg)   BMI 28.51 kg/m  He is a pleasant white male in no acute distress. HEENT normocephalic, atraumatic. Pupils are equal round and reactive to light. Oropharynx is clear. Neck is supple without JVD, adenopathy, thyromegaly, or bruits. Lungs are clear. Cardiac exam reveals a regular rate and rhythm without gallop or murmur. His abdomen is soft and nontender. He has no edema. Pedal pulses are good.  He is alert oriented x3. Cranial nerves II through XII are intact.  LABORATORY DATA: Lab Results  Component Value Date   WBC 4.9 07/20/2012   HGB 14.2 07/20/2012   HCT 40.7 07/20/2012   PLT 128 (L) 07/20/2012   GLUCOSE 142 (H) 08/08/2014   CHOL 145 08/08/2014   TRIG 187 (H) 08/08/2014   HDL 49 08/08/2014   LDLCALC 59 08/08/2014   ALT 57 (H) 08/08/2014   AST 33 08/08/2014   NA 139 08/08/2014   K 4.3 08/08/2014   CL 103  08/08/2014   CREATININE 1.13 08/08/2014   BUN 15 08/08/2014   CO2 23 08/08/2014   TSH 0.559 03/26/2012   INR 1.19 03/29/2012   HGBA1C 5.9 (H) 05/26/2012   Labs from [facility] hospital reviewed from July 11, 2015. WBC 3.5, plts 140K. Hgb 12.5. Chemistry panel is normal.  Bone marrow biopsy on 06/28/15 showed normal marrow without evidence of leukemia.  Ecg today shows NSR with rate 68. LAFB, LVH. No acute change. I have personally reviewed and interpreted this study.   Assessment / Plan: 1. Coronary disease status post CABG. Clinically without significant symptoms. Myoview study in February 2014 was normal. Continue aspirin, isosorbide, and metoprolol. I have recommended a follow up nuclear stress test at this time.   2. Hypertension. Blood pressure is well controlled.   3. Hyperlipidemia.  Continue current Zocor dose. Higher doses in the past lead to myalgias. Will follow up lipid panel since this has not been checked in last year.   4. Hairy cell leukemia. Recurrent. S/p repeat chemotherapy. Currently doing well.   5. Raynaud's phenomenon. Resolved.  6. Hollenhorst plaque noted on eye exam. Will follow thru with carotid dopplers.    I will tentatively plan follow up in 6 months.

## 2015-08-14 ENCOUNTER — Encounter: Payer: Self-pay | Admitting: Cardiology

## 2015-08-14 ENCOUNTER — Ambulatory Visit (INDEPENDENT_AMBULATORY_CARE_PROVIDER_SITE_OTHER): Payer: Medicare Other | Admitting: Cardiology

## 2015-08-14 ENCOUNTER — Telehealth: Payer: Self-pay | Admitting: Family Medicine

## 2015-08-14 VITALS — BP 120/80 | HR 68 | Ht 71.0 in | Wt 204.4 lb

## 2015-08-14 DIAGNOSIS — I10 Essential (primary) hypertension: Secondary | ICD-10-CM

## 2015-08-14 DIAGNOSIS — H34211 Partial retinal artery occlusion, right eye: Secondary | ICD-10-CM | POA: Diagnosis not present

## 2015-08-14 DIAGNOSIS — I25810 Atherosclerosis of coronary artery bypass graft(s) without angina pectoris: Secondary | ICD-10-CM | POA: Diagnosis not present

## 2015-08-14 DIAGNOSIS — E7800 Pure hypercholesterolemia, unspecified: Secondary | ICD-10-CM

## 2015-08-14 DIAGNOSIS — C9142 Hairy cell leukemia, in relapse: Secondary | ICD-10-CM | POA: Diagnosis not present

## 2015-08-14 NOTE — Patient Instructions (Signed)
Continue your current therapy  We will check your lipids with a blood test  We will schedule you for carotid dopplers and a nuclear stress test  I will see you in 6 months.

## 2015-08-14 NOTE — Telephone Encounter (Signed)
Pt saw Dr Neita Garnet for cardiology follow up. [facility]) Dr Martinique also scheduled his Carotid doppler to be done there at [facility].  Pt had not heard anything from our office concerning this referral  2. Dr Martinique also ordered a lipid panel, stress myoview, and a carotid doppler.  Dr Martinique just wanted you to know.

## 2015-08-15 LAB — LIPID PANEL
Cholesterol: 140 mg/dL (ref 125–200)
HDL: 58 mg/dL (ref >=40)
LDL CALC: 50 mg/dL (ref <130)
TRIGLYCERIDES: 158 mg/dL — AB (ref <150)
Total CHOL/HDL Ratio: 2.4 Ratio (ref <=5.0)
VLDL: 32 mg/dL — AB (ref <30)

## 2015-08-15 NOTE — Telephone Encounter (Signed)
Perfect- can we check with Neoma Laming what happened for this order. Is the carotid doppler one that is not automatically sent to her- I am going to need a list of any orders that do not go to her in long run.   Only one I am 100% clear on is DEXA which is ordered at the front.

## 2015-08-15 NOTE — Telephone Encounter (Signed)
Spoke with patient for clarification. Pt states that he saw Dr. Martinique yesterday for cardiology. Dr. Martinique saw that we had put in an order for Carotid doppler. The patient never received a phone call to get him scheduled. Dr. Martinique scheduled the patient for a doppler and stress test the 14 & 15 of this month. The patient also had a lipid panel drawn this morning. The patient confirmed that all of his needs had been met at this point.

## 2015-08-15 NOTE — Telephone Encounter (Signed)
I am not sure what patient is asking about- if he needs referral to cardiology you can refer. Or is he asking for info about the studies that were done?

## 2015-08-23 ENCOUNTER — Telehealth (HOSPITAL_COMMUNITY): Payer: Self-pay

## 2015-08-23 NOTE — Telephone Encounter (Signed)
Encounter complete. 

## 2015-08-27 ENCOUNTER — Ambulatory Visit (HOSPITAL_COMMUNITY)
Admission: RE | Admit: 2015-08-27 | Discharge: 2015-08-27 | Disposition: A | Discharge status: Home / Self Care | Payer: Medicare Other | Source: Ambulatory Visit | Attending: Cardiology | Admitting: Cardiology

## 2015-08-27 ENCOUNTER — Other Ambulatory Visit: Payer: Self-pay | Admitting: Cardiology

## 2015-08-27 ENCOUNTER — Telehealth: Payer: Self-pay | Admitting: Cardiology

## 2015-08-27 DIAGNOSIS — I25810 Atherosclerosis of coronary artery bypass graft(s) without angina pectoris: Secondary | ICD-10-CM

## 2015-08-27 DIAGNOSIS — H34211 Partial retinal artery occlusion, right eye: Secondary | ICD-10-CM

## 2015-08-27 DIAGNOSIS — C9142 Hairy cell leukemia, in relapse: Secondary | ICD-10-CM

## 2015-08-27 DIAGNOSIS — I6523 Occlusion and stenosis of bilateral carotid arteries: Secondary | ICD-10-CM

## 2015-08-27 DIAGNOSIS — E7800 Pure hypercholesterolemia, unspecified: Secondary | ICD-10-CM

## 2015-08-27 DIAGNOSIS — Z951 Presence of aortocoronary bypass graft: Secondary | ICD-10-CM | POA: Diagnosis not present

## 2015-08-27 DIAGNOSIS — I10 Essential (primary) hypertension: Secondary | ICD-10-CM | POA: Insufficient documentation

## 2015-08-27 NOTE — Telephone Encounter (Signed)
New message        Pt is having a nuclear stress test tomorrow at 9:30.  He needs to talk to someone (preferable someone in nuclear but they are not in on mondays and [address] nuclear nurses have left).  Please call him before you leave today

## 2015-08-27 NOTE — Telephone Encounter (Signed)
Patient called in - states he took his metoprolol this AM but it will be 24 hours from his last dose of beta-blocker to his scheduled stress test time. He states his sheet told him 24 hours, but when the nurse called him she told him to not take it the day prior. Advised patient since it will be greater than 24 hours since last dose, he should be fine, but to make the nuclear stress test staff aware.

## 2015-08-28 ENCOUNTER — Ambulatory Visit (HOSPITAL_COMMUNITY)
Admission: RE | Admit: 2015-08-28 | Discharge: 2015-08-28 | Disposition: A | Discharge status: Home / Self Care | Payer: Medicare Other | Source: Ambulatory Visit | Attending: Cardiovascular Disease | Admitting: Cardiovascular Disease

## 2015-08-28 DIAGNOSIS — R5383 Other fatigue: Secondary | ICD-10-CM | POA: Insufficient documentation

## 2015-08-28 DIAGNOSIS — H34211 Partial retinal artery occlusion, right eye: Secondary | ICD-10-CM | POA: Insufficient documentation

## 2015-08-28 DIAGNOSIS — R9439 Abnormal result of other cardiovascular function study: Secondary | ICD-10-CM | POA: Insufficient documentation

## 2015-08-28 DIAGNOSIS — C9142 Hairy cell leukemia, in relapse: Secondary | ICD-10-CM | POA: Insufficient documentation

## 2015-08-28 DIAGNOSIS — I25810 Atherosclerosis of coronary artery bypass graft(s) without angina pectoris: Secondary | ICD-10-CM

## 2015-08-28 DIAGNOSIS — R42 Dizziness and giddiness: Secondary | ICD-10-CM | POA: Diagnosis not present

## 2015-08-28 DIAGNOSIS — R0609 Other forms of dyspnea: Secondary | ICD-10-CM | POA: Insufficient documentation

## 2015-08-28 DIAGNOSIS — Z87891 Personal history of nicotine dependence: Secondary | ICD-10-CM | POA: Insufficient documentation

## 2015-08-28 DIAGNOSIS — I10 Essential (primary) hypertension: Secondary | ICD-10-CM | POA: Diagnosis not present

## 2015-08-28 DIAGNOSIS — E7800 Pure hypercholesterolemia, unspecified: Secondary | ICD-10-CM | POA: Insufficient documentation

## 2015-08-28 LAB — MYOCARDIAL PERFUSION IMAGING
CHL CUP RESTING HR STRESS: 60 {beats}/min
LVDIAVOL: 95 mL (ref 62–150)
LVSYSVOL: 35 mL
Peak HR: 82 {beats}/min
SDS: 2
SRS: 6
TID: 1.2

## 2015-08-28 MED ORDER — TECHNETIUM TC 99M TETROFOSMIN IV KIT
9.6000 | PACK | Freq: Once | INTRAVENOUS | Status: AC | PRN
  Administered 2015-08-28: 10 via INTRAVENOUS
  Filled 2015-08-28: qty 10

## 2015-08-28 MED ORDER — TECHNETIUM TC 99M TETROFOSMIN IV KIT
29.4000 | PACK | Freq: Once | INTRAVENOUS | Status: AC | PRN
  Administered 2015-08-28: 29 via INTRAVENOUS
  Filled 2015-08-28: qty 29

## 2015-08-28 MED ORDER — REGADENOSON 0.4 MG/5ML IV SOLN
0.4000 mg | Freq: Once | INTRAVENOUS | Status: AC
  Administered 2015-08-28: 0.4 mg via INTRAVENOUS

## 2015-09-07 ENCOUNTER — Encounter (INDEPENDENT_AMBULATORY_CARE_PROVIDER_SITE_OTHER): Payer: Medicare Other | Admitting: Ophthalmology

## 2015-09-07 DIAGNOSIS — H35033 Hypertensive retinopathy, bilateral: Secondary | ICD-10-CM

## 2015-09-07 DIAGNOSIS — H353211 Exudative age-related macular degeneration, right eye, with active choroidal neovascularization: Secondary | ICD-10-CM

## 2015-09-07 DIAGNOSIS — I10 Essential (primary) hypertension: Secondary | ICD-10-CM | POA: Diagnosis not present

## 2015-09-07 DIAGNOSIS — H43813 Vitreous degeneration, bilateral: Secondary | ICD-10-CM | POA: Diagnosis not present

## 2015-09-07 DIAGNOSIS — H353122 Nonexudative age-related macular degeneration, left eye, intermediate dry stage: Secondary | ICD-10-CM | POA: Diagnosis not present

## 2015-09-13 ENCOUNTER — Encounter (INDEPENDENT_AMBULATORY_CARE_PROVIDER_SITE_OTHER): Payer: Medicare Other | Admitting: Ophthalmology

## 2015-09-13 DIAGNOSIS — H43813 Vitreous degeneration, bilateral: Secondary | ICD-10-CM

## 2015-09-13 DIAGNOSIS — H35033 Hypertensive retinopathy, bilateral: Secondary | ICD-10-CM

## 2015-09-13 DIAGNOSIS — H34211 Partial retinal artery occlusion, right eye: Secondary | ICD-10-CM | POA: Diagnosis not present

## 2015-09-13 DIAGNOSIS — H353211 Exudative age-related macular degeneration, right eye, with active choroidal neovascularization: Secondary | ICD-10-CM | POA: Diagnosis not present

## 2015-09-13 DIAGNOSIS — H353122 Nonexudative age-related macular degeneration, left eye, intermediate dry stage: Secondary | ICD-10-CM

## 2015-09-13 DIAGNOSIS — I10 Essential (primary) hypertension: Secondary | ICD-10-CM | POA: Diagnosis not present

## 2015-09-27 DIAGNOSIS — H3560 Retinal hemorrhage, unspecified eye: Secondary | ICD-10-CM | POA: Diagnosis not present

## 2015-09-27 DIAGNOSIS — R5383 Other fatigue: Secondary | ICD-10-CM | POA: Diagnosis not present

## 2015-09-27 DIAGNOSIS — L989 Disorder of the skin and subcutaneous tissue, unspecified: Secondary | ICD-10-CM | POA: Diagnosis not present

## 2015-09-27 DIAGNOSIS — Z951 Presence of aortocoronary bypass graft: Secondary | ICD-10-CM | POA: Diagnosis not present

## 2015-09-27 DIAGNOSIS — L309 Dermatitis, unspecified: Secondary | ICD-10-CM | POA: Diagnosis not present

## 2015-09-27 DIAGNOSIS — Z79899 Other long term (current) drug therapy: Secondary | ICD-10-CM | POA: Diagnosis not present

## 2015-09-27 DIAGNOSIS — I2510 Atherosclerotic heart disease of native coronary artery without angina pectoris: Secondary | ICD-10-CM | POA: Diagnosis not present

## 2015-09-27 DIAGNOSIS — C9142 Hairy cell leukemia, in relapse: Secondary | ICD-10-CM | POA: Diagnosis not present

## 2015-09-27 DIAGNOSIS — Z7982 Long term (current) use of aspirin: Secondary | ICD-10-CM | POA: Diagnosis not present

## 2015-09-27 DIAGNOSIS — C9141 Hairy cell leukemia, in remission: Secondary | ICD-10-CM | POA: Diagnosis not present

## 2015-10-08 ENCOUNTER — Encounter (INDEPENDENT_AMBULATORY_CARE_PROVIDER_SITE_OTHER): Payer: Medicare Other | Admitting: Ophthalmology

## 2015-10-08 DIAGNOSIS — H35033 Hypertensive retinopathy, bilateral: Secondary | ICD-10-CM

## 2015-10-08 DIAGNOSIS — H353211 Exudative age-related macular degeneration, right eye, with active choroidal neovascularization: Secondary | ICD-10-CM | POA: Diagnosis not present

## 2015-10-08 DIAGNOSIS — I10 Essential (primary) hypertension: Secondary | ICD-10-CM | POA: Diagnosis not present

## 2015-10-08 DIAGNOSIS — H43813 Vitreous degeneration, bilateral: Secondary | ICD-10-CM

## 2015-10-08 DIAGNOSIS — H353122 Nonexudative age-related macular degeneration, left eye, intermediate dry stage: Secondary | ICD-10-CM | POA: Diagnosis not present

## 2015-10-08 DIAGNOSIS — H34211 Partial retinal artery occlusion, right eye: Secondary | ICD-10-CM

## 2015-10-31 DIAGNOSIS — N320 Bladder-neck obstruction: Secondary | ICD-10-CM | POA: Diagnosis not present

## 2015-10-31 DIAGNOSIS — N393 Stress incontinence (female) (male): Secondary | ICD-10-CM | POA: Diagnosis not present

## 2015-10-31 DIAGNOSIS — Z8546 Personal history of malignant neoplasm of prostate: Secondary | ICD-10-CM | POA: Diagnosis not present

## 2015-11-01 ENCOUNTER — Ambulatory Visit (INDEPENDENT_AMBULATORY_CARE_PROVIDER_SITE_OTHER): Payer: Medicare Other | Admitting: Family Medicine

## 2015-11-01 ENCOUNTER — Ambulatory Visit: Payer: Medicare Other

## 2015-11-01 DIAGNOSIS — Z23 Encounter for immunization: Secondary | ICD-10-CM | POA: Diagnosis not present

## 2015-11-01 MED ORDER — PNEUMOCOCCAL 13-VAL CONJ VACC IM SUSP: 0.5000 mL | INTRAMUSCULAR | Status: DC

## 2015-11-01 NOTE — Addendum Note (Signed)
Addended by: Elmer Picker on: 11/01/2015 12:45 PM   Modules accepted: Orders

## 2015-11-16 ENCOUNTER — Encounter (INDEPENDENT_AMBULATORY_CARE_PROVIDER_SITE_OTHER): Payer: Medicare Other | Admitting: Ophthalmology

## 2015-11-16 DIAGNOSIS — H35033 Hypertensive retinopathy, bilateral: Secondary | ICD-10-CM | POA: Diagnosis not present

## 2015-11-16 DIAGNOSIS — H353122 Nonexudative age-related macular degeneration, left eye, intermediate dry stage: Secondary | ICD-10-CM | POA: Diagnosis not present

## 2015-11-16 DIAGNOSIS — I10 Essential (primary) hypertension: Secondary | ICD-10-CM | POA: Diagnosis not present

## 2015-11-16 DIAGNOSIS — H353211 Exudative age-related macular degeneration, right eye, with active choroidal neovascularization: Secondary | ICD-10-CM | POA: Diagnosis not present

## 2015-11-16 DIAGNOSIS — H43813 Vitreous degeneration, bilateral: Secondary | ICD-10-CM

## 2015-12-28 ENCOUNTER — Encounter (INDEPENDENT_AMBULATORY_CARE_PROVIDER_SITE_OTHER): Payer: Medicare Other | Admitting: Ophthalmology

## 2015-12-28 DIAGNOSIS — I10 Essential (primary) hypertension: Secondary | ICD-10-CM

## 2015-12-28 DIAGNOSIS — H35033 Hypertensive retinopathy, bilateral: Secondary | ICD-10-CM | POA: Diagnosis not present

## 2015-12-28 DIAGNOSIS — H43813 Vitreous degeneration, bilateral: Secondary | ICD-10-CM

## 2015-12-28 DIAGNOSIS — H353211 Exudative age-related macular degeneration, right eye, with active choroidal neovascularization: Secondary | ICD-10-CM

## 2015-12-28 DIAGNOSIS — H353122 Nonexudative age-related macular degeneration, left eye, intermediate dry stage: Secondary | ICD-10-CM | POA: Diagnosis not present

## 2016-01-03 ENCOUNTER — Telehealth: Payer: Self-pay | Admitting: Family Medicine

## 2016-01-03 ENCOUNTER — Other Ambulatory Visit: Payer: Self-pay | Admitting: Cardiology

## 2016-01-03 NOTE — Telephone Encounter (Signed)
Pt would like to have a Shingle vaccine and would like to get it as soon as possible.   Is the patient eligible for shingle vaccine?

## 2016-01-03 NOTE — Telephone Encounter (Signed)
Scheduled for an appointment in January. He actually wants to follow up on 2 skin areas

## 2016-01-15 ENCOUNTER — Other Ambulatory Visit: Payer: Self-pay | Admitting: Nurse Practitioner

## 2016-01-18 ENCOUNTER — Ambulatory Visit: Payer: Medicare Other | Admitting: Family Medicine

## 2016-01-22 ENCOUNTER — Encounter: Payer: Self-pay | Admitting: Family Medicine

## 2016-01-22 ENCOUNTER — Ambulatory Visit (INDEPENDENT_AMBULATORY_CARE_PROVIDER_SITE_OTHER): Payer: Medicare Other | Admitting: Family Medicine

## 2016-01-22 VITALS — BP 122/62 | HR 66 | Temp 97.7°F | Ht 71.0 in | Wt 214.0 lb

## 2016-01-22 DIAGNOSIS — L989 Disorder of the skin and subcutaneous tissue, unspecified: Secondary | ICD-10-CM | POA: Diagnosis not present

## 2016-01-22 DIAGNOSIS — D485 Neoplasm of uncertain behavior of skin: Secondary | ICD-10-CM

## 2016-01-22 DIAGNOSIS — L570 Actinic keratosis: Secondary | ICD-10-CM | POA: Diagnosis not present

## 2016-01-22 NOTE — Progress Notes (Signed)
Pre visit review using our clinic review tool, if applicable. No additional management support is needed unless otherwise documented below in the visit note. 

## 2016-01-22 NOTE — Progress Notes (Signed)
Subjective:  [name] is a 82 y.o. year old very pleasant male patient who presents for/with See problem oriented charting ROS-not ill appearing, no fever/chills. No new medications. Not immunocompromised. No mucus membrane involvement.   Past Medical History-  Patient Active Problem List   Diagnosis Date Noted  . Hairy cell leukemia ([place]) 04/01/2012    Priority: High  . Pancytopenia ([place]) 03/25/2012    Priority: High  . Coronary artery disease     Priority: High  . History of prostate cancer 05/15/2015    Priority: Medium  . Hyperglycemia 05/15/2015    Priority: Medium  . GERD (gastroesophageal reflux disease) 04/25/2015    Priority: Medium  . IBS (irritable bowel syndrome) 07/21/2012    Priority: Medium  . Hypertension     Priority: Medium  . Hypercholesterolemia 04/20/2006    Priority: Medium  . Muscle cramping 01/23/2015    Priority: Low  . Raynaud's phenomenon 02/10/2012    Priority: Low  . Essential tremor 12/20/2010    Priority: Low  . Personal history of colonic polyps 12/15/2007    Priority: Low  . Hollenhorst plaque, right eye 08/14/2015    Medications- reviewed and updated Current Outpatient Prescriptions  Medication Sig Dispense Refill  . aspirin 81 MG tablet Take 81 mg by mouth daily.    . isosorbide mononitrate (IMDUR) 60 MG 24 hr tablet Take 1 tablet (60 mg total) by mouth daily. 90 tablet 2  . latanoprost (XALATAN) 0.005 % ophthalmic solution Place 1 drop into both eyes at bedtime.     [name] losartan (COZAAR) 25 MG tablet Take 1 tablet (25 mg total) by mouth daily. 90 tablet 3  . LUTEIN PO Take 1 tablet by mouth daily.    . metoprolol succinate (TOPROL-XL) 50 MG 24 hr tablet Take 1 tablet (50 mg total) by mouth every evening. 90 tablet 2  . Multiple Vitamin (MULTIVITAMIN) tablet Take 1 tablet by mouth daily.    . nitroGLYCERIN (NITROSTAT) 0.4 MG SL tablet Place 1 tablet (0.4 mg total) under the tongue every 5 (five) minutes as needed for chest pain (x  3 doses max). 25 tablet 3  . omeprazole (PRILOSEC) 20 MG capsule Take 1 capsule (20 mg total) by mouth every evening. 90 capsule 2  . simvastatin (ZOCOR) 20 MG tablet TAKE ONE TABLET BY MOUTH ONCE DAILY IN THE EVENING 90 tablet 2   No current facility-administered medications for this visit.     Objective: BP 122/62 (BP Location: Left Arm, Patient Position: Sitting, Cuff Size: Large)   Pulse 66   Temp 97.7 F (36.5 C) (Oral)   Ht 5\' 11"  (1.803 m)   Wt 214 lb (97.1 kg)   SpO2 94%   BMI 29.85 kg/m  Gen: NAD, resting comfortably CV: RRR no murmurs rubs or gallops Lungs: CTAB no crackles, wheeze, rhonchi Ext: no edema Skin: warm, dry, on left side of his nose has 2 small papules with white head to them also one lower closer to lip.  On left forehead he has a hard thick plaque about 10 x 6 mm. Whitish coloration. Denies having injured area  Assessment/Plan:  #1 Neoplasm of uncertain behavior of skin - Plan: Dermatology pathology #2 Skin lesion S: Patient states he has had lesion on his right scalp for at least 2 years. He states we discussed cryotherapy last visit. He feels like it has been growing since last visit and also hardened.   Patient also has an itchy area about 2 mm  on right nose in between 2 other lesions that are similar- papules with white soft center that do not appear to be pustules. He had shingles here previously and main concern is to make sure this is not related to shingles. It can be very itchy at times but not bothering him at all today A/P: From my last note I had mentioned an AK on his right forehead. Today, this lesion appears much different- it is hardened, covers much larger amount of skin. We opted instead of prior cryotherapy mentioned to pursue biopsy with shave technique today. Will send for pathology. I wonder if this is an irritated seborrheic keratosis but I am concerned with atypical appearance for  squamous cell carcinoma of the skin. Would refer to  dermatology for wider excision likely if positive for squamous cell. Procedure note below.    For other pruritic lesion- we discussed hydrocortisone prn when itchy. Do not think this is related to prior shingles. Discussed would not biopsy this.   Skin Biopsy Procedure Note   PRE-OP DIAGNOSIS: Neoplasm of uncertain behavior of skin POST-OP DIAGNOSIS: Same  PROCEDURE: Shave Biopsy          The area surrounding the skin lesion was prepared and draped in the usual sterile manner. The lesion was removed in the usual manner by the biopsy method noted above. Hemostasis was assured. The patient tolerated the procedure well.  Closure: electrocautery  Followup: The patient tolerated the procedure well without complications.  Standard post-procedure care is explained and return precautions are given.  Return precautions advised.  Garret Reddish, MD

## 2016-01-22 NOTE — Patient Instructions (Signed)
You had a biopsy today- leave bandage in place for 24 hours. May then remove and wash with soapy water but not scrub. After bathing, reapply triple antibiotic ointment and bandaid.  If it bleeds- apply pressure up to 30 minutes to an hour- seek care if continues to bleed past that point. If you have expanding redness or worsening pain then let us reevaluate the spot.   We have ordered labs or studies at this visit (pathology of lesion). It can take up to 1-2 weeks for results and processing. IF results require follow up or explanation, we will call you with instructions. Clinically stable results will be released to your [facility]. If you have not heard from [place] or cannot find your results in [facility] in 2 weeks please contact our office at [phone].  If you are not yet signed up for [facility], please consider signing up

## 2016-01-29 ENCOUNTER — Encounter (INDEPENDENT_AMBULATORY_CARE_PROVIDER_SITE_OTHER): Payer: Medicare Other | Admitting: Ophthalmology

## 2016-01-29 DIAGNOSIS — H353122 Nonexudative age-related macular degeneration, left eye, intermediate dry stage: Secondary | ICD-10-CM | POA: Diagnosis not present

## 2016-01-29 DIAGNOSIS — H35033 Hypertensive retinopathy, bilateral: Secondary | ICD-10-CM | POA: Diagnosis not present

## 2016-01-29 DIAGNOSIS — H353211 Exudative age-related macular degeneration, right eye, with active choroidal neovascularization: Secondary | ICD-10-CM

## 2016-01-29 DIAGNOSIS — I10 Essential (primary) hypertension: Secondary | ICD-10-CM | POA: Diagnosis not present

## 2016-01-29 DIAGNOSIS — H43813 Vitreous degeneration, bilateral: Secondary | ICD-10-CM | POA: Diagnosis not present

## 2016-01-31 DIAGNOSIS — C9141 Hairy cell leukemia, in remission: Secondary | ICD-10-CM | POA: Diagnosis not present

## 2016-01-31 DIAGNOSIS — Z951 Presence of aortocoronary bypass graft: Secondary | ICD-10-CM | POA: Diagnosis not present

## 2016-01-31 DIAGNOSIS — L989 Disorder of the skin and subcutaneous tissue, unspecified: Secondary | ICD-10-CM | POA: Diagnosis not present

## 2016-01-31 DIAGNOSIS — H3560 Retinal hemorrhage, unspecified eye: Secondary | ICD-10-CM | POA: Diagnosis not present

## 2016-01-31 DIAGNOSIS — I2510 Atherosclerotic heart disease of native coronary artery without angina pectoris: Secondary | ICD-10-CM | POA: Diagnosis not present

## 2016-01-31 DIAGNOSIS — R21 Rash and other nonspecific skin eruption: Secondary | ICD-10-CM | POA: Diagnosis not present

## 2016-02-27 ENCOUNTER — Other Ambulatory Visit: Payer: Self-pay | Admitting: Cardiology

## 2016-02-27 DIAGNOSIS — I10 Essential (primary) hypertension: Secondary | ICD-10-CM

## 2016-02-28 NOTE — Telephone Encounter (Signed)
Rx(s) sent to pharmacy electronically.  

## 2016-02-29 ENCOUNTER — Encounter (INDEPENDENT_AMBULATORY_CARE_PROVIDER_SITE_OTHER): Payer: Medicare Other | Admitting: Ophthalmology

## 2016-03-03 ENCOUNTER — Encounter (INDEPENDENT_AMBULATORY_CARE_PROVIDER_SITE_OTHER): Payer: Medicare Other | Admitting: Ophthalmology

## 2016-03-03 DIAGNOSIS — H35033 Hypertensive retinopathy, bilateral: Secondary | ICD-10-CM

## 2016-03-03 DIAGNOSIS — H353211 Exudative age-related macular degeneration, right eye, with active choroidal neovascularization: Secondary | ICD-10-CM | POA: Diagnosis not present

## 2016-03-03 DIAGNOSIS — I10 Essential (primary) hypertension: Secondary | ICD-10-CM

## 2016-03-03 DIAGNOSIS — H353122 Nonexudative age-related macular degeneration, left eye, intermediate dry stage: Secondary | ICD-10-CM

## 2016-03-03 DIAGNOSIS — H43813 Vitreous degeneration, bilateral: Secondary | ICD-10-CM

## 2016-03-16 ENCOUNTER — Other Ambulatory Visit: Payer: Self-pay | Admitting: Cardiology

## 2016-03-31 ENCOUNTER — Encounter (INDEPENDENT_AMBULATORY_CARE_PROVIDER_SITE_OTHER): Payer: Medicare Other | Admitting: Ophthalmology

## 2016-03-31 DIAGNOSIS — H35033 Hypertensive retinopathy, bilateral: Secondary | ICD-10-CM | POA: Diagnosis not present

## 2016-03-31 DIAGNOSIS — H43813 Vitreous degeneration, bilateral: Secondary | ICD-10-CM

## 2016-03-31 DIAGNOSIS — H353211 Exudative age-related macular degeneration, right eye, with active choroidal neovascularization: Secondary | ICD-10-CM

## 2016-03-31 DIAGNOSIS — I10 Essential (primary) hypertension: Secondary | ICD-10-CM

## 2016-03-31 DIAGNOSIS — H353122 Nonexudative age-related macular degeneration, left eye, intermediate dry stage: Secondary | ICD-10-CM

## 2016-04-07 ENCOUNTER — Other Ambulatory Visit: Payer: Self-pay | Admitting: Cardiology

## 2016-04-07 DIAGNOSIS — H401111 Primary open-angle glaucoma, right eye, mild stage: Secondary | ICD-10-CM | POA: Diagnosis not present

## 2016-04-07 DIAGNOSIS — H353212 Exudative age-related macular degeneration, right eye, with inactive choroidal neovascularization: Secondary | ICD-10-CM | POA: Diagnosis not present

## 2016-04-07 DIAGNOSIS — E119 Type 2 diabetes mellitus without complications: Secondary | ICD-10-CM | POA: Diagnosis not present

## 2016-04-07 LAB — HM DIABETES EYE EXAM

## 2016-04-08 NOTE — Telephone Encounter (Signed)
REFILL 

## 2016-04-14 ENCOUNTER — Encounter: Payer: Self-pay | Admitting: Family Medicine

## 2016-04-14 ENCOUNTER — Ambulatory Visit (INDEPENDENT_AMBULATORY_CARE_PROVIDER_SITE_OTHER): Payer: Medicare Other | Admitting: Family Medicine

## 2016-04-14 VITALS — BP 144/84 | HR 73 | Temp 98.4°F | Ht 71.0 in | Wt 213.0 lb

## 2016-04-14 DIAGNOSIS — J069 Acute upper respiratory infection, unspecified: Secondary | ICD-10-CM

## 2016-04-14 DIAGNOSIS — C9141 Hairy cell leukemia, in remission: Secondary | ICD-10-CM | POA: Diagnosis not present

## 2016-04-14 MED ORDER — PREDNISONE 20 MG PO TABS: ORAL_TABLET | ORAL | 0 refills | Status: DC

## 2016-04-14 MED ORDER — GUAIFENESIN-CODEINE 100-10 MG/5ML PO SOLN: 5.0000 mL | Freq: Four times a day (QID) | ORAL | 0 refills | Status: DC | PRN

## 2016-04-14 NOTE — Progress Notes (Signed)
Pre visit review using our clinic review tool, if applicable. No additional management support is needed unless otherwise documented below in the visit note. 

## 2016-04-14 NOTE — Progress Notes (Signed)
PCP: Garret Reddish, MD  Subjective:  [name] is a 82 y.o. year old very pleasant male patient who presents with Upper Respiratory infection symptoms including nasal congestion, mild sore throat, cough. Son down from [place]. 5 days ago started with rhinorrhea, chest congestion, cough and painful with deeper coughs, some diffuse body aches. Subjective fevers at home- no measured fevers. Not short of breath. No wheezing. No sinus pain. Phlegm in throat that is white. Not getting any better. Also has some body aches -previous treatments: rest, hydration, cough drops -sick contacts/travel/risks: denies known flu exposure.  -Hx of: allergies  ROS-denies fever, SOB, NVD, tooth pain  Pertinent Past Medical History-  Patient Active Problem List   Diagnosis Date Noted  . Hairy cell leukemia ([place]) 04/01/2012    Priority: High  . Pancytopenia ([place]) 03/25/2012    Priority: High  . Coronary artery disease     Priority: High  . History of prostate cancer 05/15/2015    Priority: Medium  . Hyperglycemia 05/15/2015    Priority: Medium  . GERD (gastroesophageal reflux disease) 04/25/2015    Priority: Medium  . IBS (irritable bowel syndrome) 07/21/2012    Priority: Medium  . Hypertension     Priority: Medium  . Hypercholesterolemia 04/20/2006    Priority: Medium  . Muscle cramping 01/23/2015    Priority: Low  . Raynaud's phenomenon 02/10/2012    Priority: Low  . Essential tremor 12/20/2010    Priority: Low  . Personal history of colonic polyps 12/15/2007    Priority: Low  . Hollenhorst plaque, right eye 08/14/2015   Medications- reviewed  Current Outpatient Prescriptions  Medication Sig Dispense Refill  . aspirin 81 MG tablet Take 81 mg by mouth daily.    . isosorbide mononitrate (IMDUR) 60 MG 24 hr tablet TAKE ONE TABLET BY MOUTH ONCE DAILY 90 tablet 0  . latanoprost (XALATAN) 0.005 % ophthalmic solution Place 1 drop into both eyes at bedtime.     [name] losartan (COZAAR) 25 MG  tablet Take 1 tablet (25 mg total) by mouth daily. 90 tablet 3  . LUTEIN PO Take 1 tablet by mouth daily.    . metoprolol succinate (TOPROL-XL) 50 MG 24 hr tablet Take 1 tablet (50 mg total) by mouth every evening. 90 tablet 2  . metoprolol succinate (TOPROL-XL) 50 MG 24 hr tablet Take 1 tablet (50 mg total) by mouth every evening. <PLEASE MAKE APPOINTMENT FOR REFILLS> 90 tablet 0  . Multiple Vitamin (MULTIVITAMIN) tablet Take 1 tablet by mouth daily.    . nitroGLYCERIN (NITROSTAT) 0.4 MG SL tablet Place 1 tablet (0.4 mg total) under the tongue every 5 (five) minutes as needed for chest pain (x 3 doses max). 25 tablet 3  . omeprazole (PRILOSEC) 20 MG capsule Take 1 capsule (20 mg total) by mouth every evening. 90 capsule 2  . omeprazole (PRILOSEC) 20 MG capsule TAKE ONE CAPSULE BY MOUTH IN THE EVENING 90 capsule 0  . simvastatin (ZOCOR) 20 MG tablet TAKE ONE TABLET BY MOUTH ONCE DAILY IN THE EVENING 90 tablet 2  . guaiFENesin-codeine 100-10 MG/5ML syrup Take 5 mLs by mouth every 6 (six) hours as needed for cough. Do not drive for 8 hours after using. 120 mL 0  . predniSONE (DELTASONE) 20 MG tablet Take 1 tablet by mouth daily for 5 days, then 1/2 tablet daily for 2 days 6 tablet 0   No current facility-administered medications for this visit.     Objective: BP (!) 144/84 (BP Location:  Left Arm, Patient Position: Sitting, Cuff Size: Large)   Pulse 73   Temp 98.4 F (36.9 C) (Oral)   Ht 5\' 11"  (1.803 m)   Wt 213 lb (96.6 kg)   SpO2 93%   BMI 29.71 kg/m  Gen: NAD,  Appears fatigued, intermittent cough and sneeze HEENT: Turbinates erythematous with clear drainage, TM normal, pharynx mildly erythematous with no tonsilar exudate or edema, no sinus tenderness CV: RRR no murmurs rubs or gallops Lungs: CTAB no crackles, wheeze, rhonchi Ext: no edema Skin: warm, dry, no rash  Last o2 level when not ill was 94%  Assessment/Plan:  Upper Respiratory infection History and exam today are  suggestive of viral infection most likely due to upper respiratory infection. I also think your allergies are playing a role here- watery eyes, runny nose, watery eyes. Symptomatic treatment with: prednisone to help reduce inflammation and allergies AND codeine cough syrup for cough (need full glass of water with this)  He is higher risk for bacterial infection due to hairy cell leukemia history though fortunately in remission. He was given very clear indications to return to see [place]. Given in remission thought use of prednisone short term would be reasonable. BP slightly up- due to feeling ill likely- will monitor. No sinus pain but has a lot of throat drainage- likely coming from sinuses- would not be surprised if ultimately needs round of antibiotics  We discussed that we did not find any infection that had higher probability of being bacterial such as pneumonia or strep throat. We discussed signs that bacterial infection may have developed particularly fever or shortness of breath. Likely course of 1-2 weeks. Patient is contagious and advised good handwashing and consideration of mask If going to be in public places or at least coughing into elbow then washing hands.   Finally, we reviewed reasons to return to care including if symptoms worsen or persist or new concerns arise- once again particularly shortness of breath or fever.  Meds ordered this encounter  Medications  . guaiFENesin-codeine 100-10 MG/5ML syrup    Sig: Take 5 mLs by mouth every 6 (six) hours as needed for cough. Do not drive for 8 hours after using.    Dispense:  120 mL    Refill:  0  . predniSONE (DELTASONE) 20 MG tablet    Sig: Take 1 tablet by mouth daily for 5 days, then 1/2 tablet daily for 2 days    Dispense:  6 tablet    Refill:  0    Garret Reddish, MD

## 2016-04-14 NOTE — Patient Instructions (Signed)
Upper Respiratory infection History and exam today are suggestive of viral infection most likely due to upper respiratory infection. I also think your allergies are playing a role here- watery eyes, runny nose, watery eyes. Symptomatic treatment with: prednisone to help reduce inflammation and allergies AND codeine cough syrup for cough (need full glass of water with this)  We discussed that we did not find any infection that had higher probability of being bacterial such as pneumonia or strep throat. We discussed signs that bacterial infection may have developed particularly fever or shortness of breath. Likely course of 1-2 weeks. Patient is contagious and advised good handwashing and consideration of mask If going to be in public places or at least coughing into elbow then washing hands.   Finally, we reviewed reasons to return to care including if symptoms worsen or persist or new concerns arise- once again particularly shortness of breath or fever.  Meds ordered this encounter  Medications  . guaiFENesin-codeine 100-10 MG/5ML syrup    Sig: Take 5 mLs by mouth every 6 (six) hours as needed for cough. Do not drive for 8 hours after using.    Dispense:  120 mL    Refill:  0  . predniSONE (DELTASONE) 20 MG tablet    Sig: Take 1 tablet by mouth daily for 5 days, then 1/2 tablet daily for 2 days    Dispense:  6 tablet    Refill:  0

## 2016-04-28 ENCOUNTER — Encounter (INDEPENDENT_AMBULATORY_CARE_PROVIDER_SITE_OTHER): Payer: Medicare Other | Admitting: Ophthalmology

## 2016-04-28 DIAGNOSIS — H43813 Vitreous degeneration, bilateral: Secondary | ICD-10-CM

## 2016-04-28 DIAGNOSIS — H353122 Nonexudative age-related macular degeneration, left eye, intermediate dry stage: Secondary | ICD-10-CM | POA: Diagnosis not present

## 2016-04-28 DIAGNOSIS — H35033 Hypertensive retinopathy, bilateral: Secondary | ICD-10-CM

## 2016-04-28 DIAGNOSIS — I10 Essential (primary) hypertension: Secondary | ICD-10-CM | POA: Diagnosis not present

## 2016-04-28 DIAGNOSIS — H353211 Exudative age-related macular degeneration, right eye, with active choroidal neovascularization: Secondary | ICD-10-CM

## 2016-05-08 DIAGNOSIS — H3530 Unspecified macular degeneration: Secondary | ICD-10-CM | POA: Diagnosis not present

## 2016-05-08 DIAGNOSIS — C9141 Hairy cell leukemia, in remission: Secondary | ICD-10-CM | POA: Diagnosis not present

## 2016-05-08 DIAGNOSIS — R21 Rash and other nonspecific skin eruption: Secondary | ICD-10-CM | POA: Diagnosis not present

## 2016-05-08 DIAGNOSIS — L989 Disorder of the skin and subcutaneous tissue, unspecified: Secondary | ICD-10-CM | POA: Diagnosis not present

## 2016-05-26 ENCOUNTER — Encounter (INDEPENDENT_AMBULATORY_CARE_PROVIDER_SITE_OTHER): Payer: Medicare Other | Admitting: Ophthalmology

## 2016-05-26 DIAGNOSIS — I10 Essential (primary) hypertension: Secondary | ICD-10-CM

## 2016-05-26 DIAGNOSIS — H35033 Hypertensive retinopathy, bilateral: Secondary | ICD-10-CM | POA: Diagnosis not present

## 2016-05-26 DIAGNOSIS — H43813 Vitreous degeneration, bilateral: Secondary | ICD-10-CM | POA: Diagnosis not present

## 2016-05-26 DIAGNOSIS — H353122 Nonexudative age-related macular degeneration, left eye, intermediate dry stage: Secondary | ICD-10-CM

## 2016-05-26 DIAGNOSIS — H353211 Exudative age-related macular degeneration, right eye, with active choroidal neovascularization: Secondary | ICD-10-CM | POA: Diagnosis not present

## 2016-05-27 ENCOUNTER — Other Ambulatory Visit: Payer: Self-pay | Admitting: Cardiology

## 2016-05-27 DIAGNOSIS — I10 Essential (primary) hypertension: Secondary | ICD-10-CM

## 2016-05-28 ENCOUNTER — Other Ambulatory Visit: Payer: Self-pay

## 2016-05-28 DIAGNOSIS — I10 Essential (primary) hypertension: Secondary | ICD-10-CM

## 2016-05-28 MED ORDER — METOPROLOL SUCCINATE ER 50 MG PO TB24: 50.0000 mg | ORAL_TABLET | Freq: Every evening | ORAL | 1 refills | Status: DC

## 2016-05-28 MED ORDER — LOSARTAN POTASSIUM 25 MG PO TABS: 25.0000 mg | ORAL_TABLET | Freq: Every day | ORAL | 1 refills | Status: DC

## 2016-05-28 NOTE — Telephone Encounter (Signed)
Medications already refilled in another encounter

## 2016-05-29 ENCOUNTER — Telehealth: Payer: Self-pay | Admitting: Cardiology

## 2016-05-29 ENCOUNTER — Encounter: Payer: Self-pay | Admitting: Cardiology

## 2016-05-29 NOTE — Telephone Encounter (Signed)
°*  STAT* If patient is at the pharmacy, call can be transferred to refill team.   1. Which medications need to be refilled? (please list name of each medication and dose if known) metoprolol 50 MG and Losartan   2. Which pharmacy/location (including street and city if local pharmacy) is medication to be sent to?[place], [place]  3. Do they need a 30 day or 90 day supply? [place]

## 2016-05-29 NOTE — Telephone Encounter (Signed)
Rx for Metoprolol and Losartan were sent for 90 days with 1 refill to [facility]  on [address] 05/28/16.

## 2016-06-24 ENCOUNTER — Other Ambulatory Visit: Payer: Self-pay | Admitting: Cardiology

## 2016-06-30 ENCOUNTER — Encounter (INDEPENDENT_AMBULATORY_CARE_PROVIDER_SITE_OTHER): Payer: Medicare Other | Admitting: Ophthalmology

## 2016-06-30 DIAGNOSIS — H353122 Nonexudative age-related macular degeneration, left eye, intermediate dry stage: Secondary | ICD-10-CM

## 2016-06-30 DIAGNOSIS — H43813 Vitreous degeneration, bilateral: Secondary | ICD-10-CM

## 2016-06-30 DIAGNOSIS — H35033 Hypertensive retinopathy, bilateral: Secondary | ICD-10-CM | POA: Diagnosis not present

## 2016-06-30 DIAGNOSIS — I10 Essential (primary) hypertension: Secondary | ICD-10-CM

## 2016-06-30 DIAGNOSIS — H353211 Exudative age-related macular degeneration, right eye, with active choroidal neovascularization: Secondary | ICD-10-CM | POA: Diagnosis not present

## 2016-07-06 ENCOUNTER — Other Ambulatory Visit: Payer: Self-pay | Admitting: Cardiology

## 2016-07-15 ENCOUNTER — Encounter: Payer: Self-pay | Admitting: Cardiology

## 2016-07-15 ENCOUNTER — Ambulatory Visit (INDEPENDENT_AMBULATORY_CARE_PROVIDER_SITE_OTHER): Payer: Medicare Other | Admitting: Cardiology

## 2016-07-15 VITALS — BP 132/78 | HR 74 | Ht 71.0 in | Wt 205.0 lb

## 2016-07-15 DIAGNOSIS — I25810 Atherosclerosis of coronary artery bypass graft(s) without angina pectoris: Secondary | ICD-10-CM

## 2016-07-15 DIAGNOSIS — I10 Essential (primary) hypertension: Secondary | ICD-10-CM

## 2016-07-15 DIAGNOSIS — E7800 Pure hypercholesterolemia, unspecified: Secondary | ICD-10-CM | POA: Diagnosis not present

## 2016-07-15 NOTE — Progress Notes (Signed)
[name] Date of Birth: [birth year]   History of Present Illness: [name] is seen today for followup CAD. He has a history of coronary disease and is status post CABG in 2006.  He had a normal stress Myoview study in August 2017. He has a history of  hairy cell leukemia. He was treated with Cladribine at [facility] completed in 2014. This year he had recurrence and was treated with chemo therapy with Pentostatin and Rituxan. Last BM biopsy in June 2017 was normal.   On follow up today he states he has had a good year.   He denies any symptoms of chest pain or SOB.    He has been diagnosed with a macular hemorrhage and degeneration. He is getting intra-occular injections monthly.  Current Outpatient Prescriptions on File Prior to Visit  Medication Sig Dispense Refill  . aspirin 81 MG tablet Take 81 mg by mouth daily.    . isosorbide mononitrate (IMDUR) 60 MG 24 hr tablet TAKE ONE TABLET BY MOUTH ONCE DAILY 90 tablet 0  . latanoprost (XALATAN) 0.005 % ophthalmic solution Place 1 drop into both eyes at bedtime.     [name] losartan (COZAAR) 25 MG tablet Take 1 tablet (25 mg total) by mouth daily. 90 tablet 1  . LUTEIN PO Take 1 tablet by mouth daily.    . metoprolol succinate (TOPROL-XL) 50 MG 24 hr tablet Take 1 tablet (50 mg total) by mouth every evening. 90 tablet 2  . Multiple Vitamin (MULTIVITAMIN) tablet Take 1 tablet by mouth daily.    . nitroGLYCERIN (NITROSTAT) 0.4 MG SL tablet Place 1 tablet (0.4 mg total) under the tongue every 5 (five) minutes as needed for chest pain (x 3 doses max). 25 tablet 3  . omeprazole (PRILOSEC) 20 MG capsule TAKE 1 CAPSULE BY MOUTH IN THE EVENING 90 capsule 0  . predniSONE (DELTASONE) 20 MG tablet Take 1 tablet by mouth daily for 5 days, then 1/2 tablet daily for 2 days 6 tablet 0  . simvastatin (ZOCOR) 20 MG tablet TAKE ONE TABLET BY MOUTH ONCE DAILY IN THE EVENING 90 tablet 2   No current facility-administered medications on  file prior to visit.     Allergies  Allergen Reactions  . Clindamycin Other (See Comments)    Unknown reaction from childhood    Past Medical History:  Diagnosis Date  . Arthritis   . Bladder neck contracture   . Cataract immature BILATERAL  . Coronary artery disease CARDIOLOGIST- DR Martinique-  LAST VISIT NOTE 12-20-2010 IN EPIC AND W/ CHART   subendocranial MI; left inter. mamm to LAD, saph vein to diag; saph vein to distal circ; saph vein to 1st obtuse  . DIVERTICULOSIS, COLON 09/05/2003   Qualifier: Diagnosis of  By: Nolon Rod CMA (AAMA), Robin    . EUSTACHIAN TUBE DYSFUNCTION, BILATERAL 06/22/2008   Qualifier: Diagnosis of  By: Linna Darner MD, William    . EXTERNAL HEMORRHOIDS 12/14/2007   Qualifier: Diagnosis of  By: Nolon Rod CMA (AAMA), Robin    . Frequency of urination   . GERD (gastroesophageal reflux disease)   . Glaucoma BILATERAL  . H/O hiatal hernia   . Hairy cell leukemia ([place])   . History of atherosclerotic cardiovascular disease   . History of kidney stones   . History of myocardial infarction 2006   S/P CABG  . History of prostate cancer S/P PROSTATECTOMY-  NO RECURRENCE   FOLLOWED BY DR Risa Grill  . Hyperlipidemia   .  Hypertension   . Hypertriglyceridemia   . Insomnia   . Macular degeneration   . Nocturia   . Raynaud's phenomenon   . Right arm cellulitis 04/02/12   post cat bite  . S/P CABG x 4 2006  . Stable angina ([facility])   . Urgency of urination     Past Surgical History:  Procedure Laterality Date  . BALLOON DILATION  04/30/2011   Procedure: BALLOON DILATION;  Surgeon: Bernestine Amass, MD;  Location: [facility];  Service: Urology;  Laterality: N/A;  . CARDIAC CATHETERIZATION  07-30-2006  DR Martinique   POST CABG / OCCLUDED DIAGONAL &  FOURTH OBTUSE MARGINAL GRAFTS/ NORMAL LVF/ PATENT [place] TO LAD & SEPHENOUS VEIN GRAFT TO 1ST OBTUSE MARGINAL   . CARDIAC CATHETERIZATION  04-23-2004   CRITICAL THREE-VESSEL CAD/ PRESERVED LVF  . CORONARY ARTERY  BYPASS GRAFT  04-25-2004  X4 VESSELS   left inter. mamm to LAD, saph vein to diag; saph vein to distal circ; saph vein to 1st obtuse  . CYSTOSCOPY  04/30/2011   Procedure: CYSTOSCOPY;  Surgeon: Bernestine Amass, MD;  Location: [facility];  Service: Urology;  Laterality: N/A;  30 MINUTES   . DEBRIDEMENT TENNIS ELBOW  2002   RIGHT; [facility]  . LAPAROSCOPIC CHOLECYSTECTOMY  02-05-2005  . ROBOT ASSISTED LAPAROSCOPIC RADICAL PROSTATECTOMY  05-11-2006    History  Smoking Status  . Former Smoker  . Years: 3.00  . Types: Cigarettes, Cigars  Smokeless Tobacco  . Never Used    Comment: QUIT SMOKING CIGARETTES 1964 AND QUIT OCCASIONAL CIGAR  2007    History  Alcohol Use  . 8.4 oz/week  . 7 Glasses of wine, 7 Shots of liquor per week    Family History  Problem Relation Age of Onset  . Heart disease Sister        CABGX 1 sister and stents X1 sister  . Cancer Sister   . Parkinsonism Father   . Diabetes Neg Hx   . Stroke Neg Hx     Review of Systems: As noted in history of present illness. All other systems were reviewed and are negative.  Physical Exam: BP 132/78   Pulse 74   Ht '5\' 11"'  (1.803 m)   Wt 205 lb (93 kg)   BMI 28.59 kg/m  He is a pleasant white male in no acute distress. HEENT normocephalic, atraumatic. Pupils are equal round and reactive to light. Oropharynx is clear. Neck is supple without JVD, adenopathy, thyromegaly, or bruits. Lungs are clear. Cardiac exam reveals a regular rate and rhythm without gallop or murmur. Normal S1-2. His abdomen is soft and nontender. He has no edema. Pedal pulses are good.  He is alert oriented x3. Cranial nerves II through XII are intact.  LABORATORY DATA: Lab Results  Component Value Date   WBC 4.9 07/20/2012   HGB 14.2 07/20/2012   HCT 40.7 07/20/2012   PLT 128 (L) 07/20/2012   GLUCOSE 142 (H) 08/08/2014   CHOL 140 08/14/2015   TRIG 158 (H) 08/14/2015   HDL 58 08/14/2015   LDLCALC 50 08/14/2015   ALT 57 (H)  08/08/2014   AST 33 08/08/2014   NA 139 08/08/2014   K 4.3 08/08/2014   CL 103 08/08/2014   CREATININE 1.13 08/08/2014   BUN 15 08/08/2014   CO2 23 08/08/2014   TSH 0.559 03/26/2012   INR 1.19 03/29/2012   HGBA1C 5.9 (H) 05/26/2012   Labs from [facility]  reviewed from July 11, 2015. WBC 3.5, plts 140K. Hgb 12.5. Chemistry panel is normal.  Bone marrow biopsy on 06/28/15 showed normal marrow without evidence of leukemia.  Labs dated 05/08/16: glucose 247, creatinine 1.09, AST 41, AST 52. Other chemistries normal. Hgb normal.   Myoview 08/28/15: Study Highlights     Nuclear stress EF: 63%. Post CABG septal wall hypokinesis  There was no ST segment deviation noted during stress.  Defect 1: There is a small defect of mild severity present in the apex location. No ischemia.  This is a low risk study.     Assessment / Plan: 1. Coronary disease status post CABG. Clinically without significant symptoms. Myoview study in August 2017 was normal. Continue aspirin, isosorbide, and metoprolol.  2. Hypertension. Blood pressure is well controlled.   3. Hyperlipidemia.  Continue current Zocor dose. Higher doses in the past lead to myalgias. Will follow up lipid panel since this has not been checked in last year.   4. Hairy cell leukemia. Recurrent. S/p repeat chemotherapy. In remission  5. Elevated blood sugar. Will repeat fasting BS and A1c.   6. Hollenhorst plaque noted on eye exam. Carotid dopplers with only mild plaque.  I will follow up in one year.

## 2016-07-15 NOTE — Patient Instructions (Signed)
We will check blood work today  Continue your current therapy  Continue to exercise regularly  I will see you in one year.

## 2016-07-17 DIAGNOSIS — I10 Essential (primary) hypertension: Secondary | ICD-10-CM | POA: Diagnosis not present

## 2016-07-17 DIAGNOSIS — I25810 Atherosclerosis of coronary artery bypass graft(s) without angina pectoris: Secondary | ICD-10-CM | POA: Diagnosis not present

## 2016-07-17 DIAGNOSIS — E7800 Pure hypercholesterolemia, unspecified: Secondary | ICD-10-CM | POA: Diagnosis not present

## 2016-07-18 ENCOUNTER — Other Ambulatory Visit: Payer: Self-pay

## 2016-07-18 ENCOUNTER — Other Ambulatory Visit: Payer: Self-pay | Admitting: Cardiology

## 2016-07-18 LAB — BASIC METABOLIC PANEL
BUN / CREAT RATIO: 13 (ref 10–24)
BUN: 14 mg/dL (ref 8–27)
CO2: 22 mmol/L (ref 20–29)
CREATININE: 1.07 mg/dL (ref 0.76–1.27)
Calcium: 8.9 mg/dL (ref 8.6–10.2)
Chloride: 99 mmol/L (ref 96–106)
GFR calc Af Amer: 75 mL/min/{1.73_m2} (ref >59)
GFR, EST NON AFRICAN AMERICAN: 65 mL/min/{1.73_m2} (ref >59)
Glucose: 175 mg/dL — ABNORMAL HIGH (ref 65–99)
Potassium: 4.5 mmol/L (ref 3.5–5.2)
SODIUM: 136 mmol/L (ref 134–144)

## 2016-07-18 LAB — HEMOGLOBIN A1C
ESTIMATED AVERAGE GLUCOSE: 206 mg/dL
Hgb A1c MFr Bld: 8.8 % — ABNORMAL HIGH (ref 4.8–5.6)

## 2016-07-18 LAB — LIPID PANEL
CHOL/HDL RATIO: 3.9 ratio (ref 0.0–5.0)
Cholesterol, Total: 128 mg/dL (ref 100–199)
HDL: 33 mg/dL — ABNORMAL LOW (ref >39)
LDL CALC: 35 mg/dL (ref 0–99)
TRIGLYCERIDES: 299 mg/dL — AB (ref 0–149)
VLDL CHOLESTEROL CAL: 60 mg/dL — AB (ref 5–40)

## 2016-07-18 LAB — HEPATIC FUNCTION PANEL
ALT: 52 IU/L — ABNORMAL HIGH (ref 0–44)
AST: 37 IU/L (ref 0–40)
Albumin: 4.2 g/dL (ref 3.5–4.7)
Alkaline Phosphatase: 53 IU/L (ref 39–117)
BILIRUBIN TOTAL: 0.5 mg/dL (ref 0.0–1.2)
Bilirubin, Direct: 0.2 mg/dL (ref 0.00–0.40)
Total Protein: 6.3 g/dL (ref 6.0–8.5)

## 2016-07-18 MED ORDER — ISOSORBIDE MONONITRATE ER 60 MG PO TB24
60.0000 mg | ORAL_TABLET | Freq: Every day | ORAL | 3 refills | Status: DC
Start: 2016-07-18 — End: 2017-08-24

## 2016-07-31 ENCOUNTER — Ambulatory Visit (INDEPENDENT_AMBULATORY_CARE_PROVIDER_SITE_OTHER): Payer: Medicare Other | Admitting: Family Medicine

## 2016-07-31 ENCOUNTER — Encounter: Payer: Self-pay | Admitting: Family Medicine

## 2016-07-31 VITALS — BP 118/84 | HR 63 | Temp 97.9°F | Ht 71.0 in | Wt 205.8 lb

## 2016-07-31 DIAGNOSIS — E119 Type 2 diabetes mellitus without complications: Secondary | ICD-10-CM | POA: Insufficient documentation

## 2016-07-31 DIAGNOSIS — E1165 Type 2 diabetes mellitus with hyperglycemia: Secondary | ICD-10-CM | POA: Diagnosis not present

## 2016-07-31 MED ORDER — METFORMIN HCL 500 MG PO TABS: 500.0000 mg | ORAL_TABLET | Freq: Two times a day (BID) | ORAL | 3 refills | Status: DC

## 2016-07-31 NOTE — Patient Instructions (Signed)
Sign release of information at the check out desk for Dr. Linus Orn, Dr. Zigmund Daniel, Dr. Delman Cheadle- so we can send my office note today when I complete it  Will refer you to diabetes education. We will call you within a week or two about your referral. If you do not hear within 3 weeks, give [name] a call.   Start metformin tomorrow 258m twice a day for 3 days (half of 5059mpill) Then full 50014mill twice a day thereafter  Check blood sugar at least twice a week so we can get a trend, also check If you ever feel like blood sugar is getting low (will have JamRoselyn Reefme in to discuss this). A true low would be under 70.   Check back in 1 month from now- sooner if you need us [place]oglycemia Hypoglycemia is when the sugar (glucose) level in the blood is too low. Symptoms of low blood sugar may include:  Feeling: ? Hungry. ? Worried or nervous (anxious). ? Sweaty and clammy. ? Confused. ? Dizzy. ? Sleepy. ? Sick to your stomach (nauseous).  Having: ? A fast heartbeat. ? A headache. ? A change in your vision. ? Jerky movements that you cannot control (seizure). ? Nightmares. ? Tingling or no feeling (numbness) around the mouth, lips, or tongue.  Having trouble with: ? Talking. ? Paying attention (concentrating). ? Moving (coordination). ? Sleeping.  Shaking.  Passing out (fainting).  Getting upset easily (irritability).  Low blood sugar can happen to people who have diabetes and people who do not have diabetes. Low blood sugar can happen quickly, and it can be an emergency. Treating Low Blood Sugar Low blood sugar is often treated by eating or drinking something sugary right away. If you can think clearly and swallow safely, follow the 15:15 rule:  Take 15 grams of a fast-acting carb (carbohydrate). Some fast-acting carbs are: ? 1 tube of glucose gel. ? 3 sugar tablets (glucose pills). ? 6-8 pieces of hard candy. ? 4 oz (120 mL) of fruit juice. ? 4 oz (120 mL) of regular (not diet)  soda.  Check your blood sugar 15 minutes after you take the carb.  If your blood sugar is still at or below 70 mg/dL (3.9 mmol/L), take 15 grams of a carb again.  If your blood sugar does not go above 70 mg/dL (3.9 mmol/L) after 3 tries, get help right away.  After your blood sugar goes back to normal, eat a meal or a snack within 1 hour.  Treating Very Low Blood Sugar If your blood sugar is at or below 54 mg/dL (3 mmol/L), you have very low blood sugar (severe hypoglycemia). This is an emergency. Do not wait to see if the symptoms will go away. Get medical help right away. Call your local emergency services (911 in the [place] Do not drive yourself to the hospital. If you have very low blood sugar and you cannot eat or drink, you may need a glucagon shot (injection). A family member or friend should learn how to check your blood sugar and how to give you a glucagon shot. Ask your doctor if you need to have a glucagon shot kit at home. Follow these instructions at home: General instructions  Avoid any diets that cause you to not eat enough food. Talk with your doctor before you start any new diet.  Take over-the-counter and prescription medicines only as told by your doctor.  Limit alcohol to no more than 1 drink per day  for nonpregnant women and 2 drinks per day for men. One drink equals 12 oz of beer, 5 oz of wine, or 1 oz of hard liquor.  Keep all follow-up visits as told by your doctor. This is important. If You Have Diabetes:   Make sure you know the symptoms of low blood sugar.  Always keep a source of sugar with you, such as: ? Sugar. ? Sugar tablets. ? Glucose gel. ? Fruit juice. ? Regular soda (not diet soda). ? Milk. ? Hard candy. ? Honey.  Take your medicines as told.  Follow your exercise and meal plan. ? Eat on time. Do not skip meals. ? Follow your sick day plan when you cannot eat or drink normally. Make this plan ahead of time with your doctor.  Check your  blood sugar as often as told by your doctor. Always check before and after exercise.  Share your diabetes care plan with: ? Your work or school. ? People you live with.  Check your pee (urine) for ketones: ? When you are sick. ? As told by your doctor.  Carry a card or wear jewelry that says you have diabetes. If You Have Low Blood Sugar From Other Causes:   Check your blood sugar as often as told by your doctor.  Follow instructions from your doctor about what you cannot eat or drink. Contact a doctor if:  You have trouble keeping your blood sugar in your target range.  You have low blood sugar often. Get help right away if:  You still have symptoms after you eat or drink something sugary.  Your blood sugar is at or below 54 mg/dL (3 mmol/L).  You have jerky movements that you cannot control.  You pass out. These symptoms may be an emergency. Do not wait to see if the symptoms will go away. Get medical help right away. Call your local emergency services (911 in the [place] Do not drive yourself to the hospital. This information is not intended to replace advice given to you by your health care provider. Make sure you discuss any questions you have with your health care provider. Document Released: 03/26/2009 Document Revised: 06/07/2015 Document Reviewed: 02/02/2015 Elsevier Interactive Patient Education  [employer].

## 2016-07-31 NOTE — Assessment & Plan Note (Signed)
S: New diagnosis and poorly controlled. On no rx. Also admits to 1 martini, 2 glasses of wine- limit to 1 a day. Drinks sweet tea- advised to cut. States was told was at high risk by prior PCP CBGs- . Instructed on how to check CBGs. CBG in office 160  Lab Results  Component Value Date   HGBA1C 8.8 (H) 07/17/2016   HGBA1C 5.9 (H) 05/26/2012   A/P: education provided today on carbs/sweets/hypoglycemia and limiting but also - refer to diabetes education  -start metformin 500mg  BID- but titrate to this starting with 250mg  BID per avs -limit alcohol to once a day from 3x a day.  -cut sweet tea - visit in 1 month

## 2016-07-31 NOTE — Progress Notes (Signed)
Subjective:  [name] is a 82 y.o. year old very pleasant male patient who presents for/with See problem oriented charting ROS- states has been feeling fatigued. No chest pain or shortness of breath. No headaches.    Past Medical History-  Patient Active Problem List   Diagnosis Date Noted  . Uncontrolled type 2 diabetes mellitus with hyperglycemia, without long-term current use of insulin ([place]) 07/31/2016    Priority: High  . Hairy cell leukemia ([place]) 04/01/2012    Priority: High  . Pancytopenia ([place]) 03/25/2012    Priority: High  . Coronary artery disease     Priority: High  . History of prostate cancer 05/15/2015    Priority: Medium  . Hyperglycemia 05/15/2015    Priority: Medium  . GERD (gastroesophageal reflux disease) 04/25/2015    Priority: Medium  . IBS (irritable bowel syndrome) 07/21/2012    Priority: Medium  . Hypertension     Priority: Medium  . Hypercholesterolemia 04/20/2006    Priority: Medium  . Muscle cramping 01/23/2015    Priority: Low  . Raynaud's phenomenon 02/10/2012    Priority: Low  . Essential tremor 12/20/2010    Priority: Low  . Personal history of colonic polyps 12/15/2007    Priority: Low  . Hollenhorst plaque, right eye 08/14/2015    Medications- reviewed and updated Current Outpatient Prescriptions  Medication Sig Dispense Refill  . aspirin 81 MG tablet Take 81 mg by mouth daily.    . isosorbide mononitrate (IMDUR) 60 MG 24 hr tablet Take 1 tablet (60 mg total) by mouth daily. 90 tablet 3  . latanoprost (XALATAN) 0.005 % ophthalmic solution Place 1 drop into both eyes at bedtime.     [name] losartan (COZAAR) 25 MG tablet Take 1 tablet (25 mg total) by mouth daily. 90 tablet 1  . LUTEIN PO Take 1 tablet by mouth daily.    . metoprolol succinate (TOPROL-XL) 50 MG 24 hr tablet Take 1 tablet (50 mg total) by mouth every evening. 90 tablet 2  . Multiple Vitamin (MULTIVITAMIN) tablet Take 1 tablet by mouth daily.    . nitroGLYCERIN  (NITROSTAT) 0.4 MG SL tablet Place 1 tablet (0.4 mg total) under the tongue every 5 (five) minutes as needed for chest pain (x 3 doses max). 25 tablet 3  . omeprazole (PRILOSEC) 20 MG capsule TAKE 1 CAPSULE BY MOUTH IN THE EVENING 90 capsule 0  . simvastatin (ZOCOR) 20 MG tablet TAKE ONE TABLET BY MOUTH ONCE DAILY IN THE EVENING 90 tablet 2  . metFORMIN (GLUCOPHAGE) 500 MG tablet Take 1 tablet (500 mg total) by mouth 2 (two) times daily with a meal. 60 tablet 3   No current facility-administered medications for this visit.     Objective: BP 118/84 (BP Location: Left Arm, Patient Position: Sitting, Cuff Size: Large)   Pulse 63   Temp 97.9 F (36.6 C) (Oral)   Ht 5\' 11"  (1.803 m)   Wt 205 lb 12.8 oz (93.4 kg)   SpO2 97%   BMI 28.70 kg/m  Gen: NAD, resting comfortably CV: RRR no murmurs rubs or gallops Lungs: CTAB no crackles, wheeze, rhonchi Abdomen: soft/nontender/nondistended/normal bowel sounds. No rebound or guarding.  Ext: no edema Skin: warm, dry, erythematous area with scale on scalp x2 < 1 cm   Assessment/Plan:  Uncontrolled type 2 diabetes mellitus with hyperglycemia, without long-term current use of insulin ([place]) S: New diagnosis and poorly controlled. On no rx. Also admits to 1 martini, 2 glasses of wine- limit to  1 a day. Drinks sweet tea- advised to cut. States was told was at high risk by prior PCP CBGs- . Instructed on how to check CBGs. CBG in office 160  Lab Results  Component Value Date   HGBA1C 8.8 (H) 07/17/2016   HGBA1C 5.9 (H) 05/26/2012   A/P: education provided today on carbs/sweets/hypoglycemia and limiting but also - refer to diabetes education  -start metformin 500mg  BID- but titrate to this starting with 250mg  BID per avs -limit alcohol to once a day from 3x a day.  -cut sweet tea - visit in 1 month  Fatigued- harder to walk- no chest pain or shortness of breath. We agreed this could be from dealing with hyperglycemia. Would workup further if not  improving with cbg improvement  Will Have this forwarded to - [facility] hem/onc. Dr. Jeb Levering AK on scalp x2- likely cryotherpay at next visit in 1 month  Orders Placed This Encounter  Procedures  . Amb Referral to Nutrition and Diabetic E    Referral Priority:   Routine    Referral Type:   Consultation    Referral Reason:   Specialty Services Required    Number of Visits Requested:   1    Meds ordered this encounter  Medications  . metFORMIN (GLUCOPHAGE) 500 MG tablet    Sig: Take 1 tablet (500 mg total) by mouth 2 (two) times daily with a meal.    Dispense:  60 tablet    Refill:  3   The duration of face-to-face time during this visit was greater than 25 minutes. Greater than 50% of this time was spent in counseling, explanation of diagnosis, planning of further management, and/or coordination of care including discussion of what diabetes is, what goals are, treatment plan, hypoglycemia, checking cbg.     Return precautions advised.  Garret Reddish, MD

## 2016-08-04 ENCOUNTER — Encounter (INDEPENDENT_AMBULATORY_CARE_PROVIDER_SITE_OTHER): Payer: Medicare Other | Admitting: Ophthalmology

## 2016-08-04 ENCOUNTER — Ambulatory Visit: Payer: Medicare Other | Admitting: Cardiology

## 2016-08-04 DIAGNOSIS — H353211 Exudative age-related macular degeneration, right eye, with active choroidal neovascularization: Secondary | ICD-10-CM | POA: Diagnosis not present

## 2016-08-04 DIAGNOSIS — I10 Essential (primary) hypertension: Secondary | ICD-10-CM

## 2016-08-04 DIAGNOSIS — H43813 Vitreous degeneration, bilateral: Secondary | ICD-10-CM | POA: Diagnosis not present

## 2016-08-04 DIAGNOSIS — H35033 Hypertensive retinopathy, bilateral: Secondary | ICD-10-CM

## 2016-08-04 DIAGNOSIS — H353122 Nonexudative age-related macular degeneration, left eye, intermediate dry stage: Secondary | ICD-10-CM

## 2016-08-18 ENCOUNTER — Ambulatory Visit (INDEPENDENT_AMBULATORY_CARE_PROVIDER_SITE_OTHER): Payer: Medicare Other | Admitting: Family Medicine

## 2016-08-18 ENCOUNTER — Encounter: Payer: Self-pay | Admitting: Family Medicine

## 2016-08-18 VITALS — BP 118/78 | HR 64 | Temp 97.7°F | Wt 199.0 lb

## 2016-08-18 DIAGNOSIS — H8110 Benign paroxysmal vertigo, unspecified ear: Secondary | ICD-10-CM

## 2016-08-18 MED ORDER — MECLIZINE HCL 12.5 MG PO TABS: 12.5000 mg | ORAL_TABLET | Freq: Three times a day (TID) | ORAL | 0 refills | Status: DC | PRN

## 2016-08-18 NOTE — Patient Instructions (Signed)
Please complete exercises and follow up with [name] Channel if symptoms do not improve with treatment, worsen, or you develop new symptoms. Also, meclizine has been provided for you if needed which can cause drowsiness.    Benign Positional Vertigo Vertigo is the feeling that you or your surroundings are moving when they are not. Benign positional vertigo is the most common form of vertigo. The cause of this condition is not serious (is benign). This condition is triggered by certain movements and positions (is positional). This condition can be dangerous if it occurs while you are doing something that could endanger you or others, such as driving. What are the causes? In many cases, the cause of this condition is not known. It may be caused by a disturbance in an area of the inner ear that helps your brain to sense movement and balance. This disturbance can be caused by a viral infection (labyrinthitis), head injury, or repetitive motion. What increases the risk? This condition is more likely to develop in:  Women.  People who are 33 years of age or older.  What are the signs or symptoms? Symptoms of this condition usually happen when you move your head or your eyes in different directions. Symptoms may start suddenly, and they usually last for less than a minute. Symptoms may include:  Loss of balance and falling.  Feeling like you are spinning or moving.  Feeling like your surroundings are spinning or moving.  Nausea and vomiting.  Blurred vision.  Dizziness.  Involuntary eye movement (nystagmus).  Symptoms can be mild and cause only slight annoyance, or they can be severe and interfere with daily life. Episodes of benign positional vertigo may return (recur) over time, and they may be triggered by certain movements. Symptoms may improve over time. How is this diagnosed? This condition is usually diagnosed by medical history and a physical exam of the head, neck, and ears. You may  be referred to a health care provider who specializes in ear, nose, and throat (ENT) problems (otolaryngologist) or a provider who specializes in disorders of the nervous system (neurologist). You may have additional testing, including:  MRI.  A CT scan.  Eye movement tests. Your health care provider may ask you to change positions quickly while he or she watches you for symptoms of benign positional vertigo, such as nystagmus. Eye movement may be tested with an electronystagmogram (ENG), caloric stimulation, the Dix-Hallpike test, or the roll test.  An electroencephalogram (EEG). This records electrical activity in your brain.  Hearing tests.  How is this treated? Usually, your health care provider will treat this by moving your head in specific positions to adjust your inner ear back to normal. Surgery may be needed in severe cases, but this is rare. In some cases, benign positional vertigo may resolve on its own in 2-4 weeks. Follow these instructions at home: Safety  Move slowly.Avoid sudden body or head movements.  Avoid driving.  Avoid operating heavy machinery.  Avoid doing any tasks that would be dangerous to you or others if a vertigo episode would occur.  If you have trouble walking or keeping your balance, try using a cane for stability. If you feel dizzy or unstable, sit down right away.  Return to your normal activities as told by your health care provider. Ask your health care provider what activities are safe for you. General instructions  Take over-the-counter and prescription medicines only as told by your health care provider.  Avoid certain positions or movements  as told by your health care provider.  Drink enough fluid to keep your urine clear or pale yellow.  Keep all follow-up visits as told by your health care provider. This is important. Contact a health care provider if:  You have a fever.  Your condition gets worse or you develop new  symptoms.  Your family or friends notice any behavioral changes.  Your nausea or vomiting gets worse.  You have numbness or a "pins and needles" sensation. Get help right away if:  You have difficulty speaking or moving.  You are always dizzy.  You faint.  You develop severe headaches.  You have weakness in your legs or arms.  You have changes in your hearing or vision.  You develop a stiff neck.  You develop sensitivity to light. This information is not intended to replace advice given to you by your health care provider. Make sure you discuss any questions you have with your health care provider. Document Released: 10/07/2005 Document Revised: 06/07/2015 Document Reviewed: 04/24/2014 Elsevier Interactive Patient Education  [employer].

## 2016-08-18 NOTE — Progress Notes (Signed)
Subjective:    Patient ID: [name], male    DOB: [birth year], 82 y.o.   MRN: [id number]  HPI  [name] is an 82 year old male who presents today with dizziness that started 2 days ago however has been improving since yesterday.[name] He states that symptoms are noticed when getting up from a lying position or bending over. Associated nausea without vomiting. This occurred approximately one year ago and he reports that symptom is the same today. This has occurred when turning over in bed and is noticed with head movement.  He denies any facial drooping or weakness. No slurred words or trouble swallowing, blurry vision, or double vision.   He states that he realized that he was not drinking enough water and he reports that he he has started increasing water yesterday which has improved symptoms.  Alleviating factor is noted by getting up slower and not moving quickly. Aggravating factor is noted as bending over.  Dizziness last seconds and will dissipate after standing up. He also notices this symptom when he is exposed to heat.  Review of Systems  Constitutional: Negative for chills and fever.  Respiratory: Negative for cough, shortness of breath and wheezing.   Cardiovascular: Negative for chest pain and palpitations.  Gastrointestinal: Positive for nausea. Negative for vomiting.  Skin: Negative for rash.  Neurological: Positive for dizziness. Negative for seizures, syncope, speech difficulty, weakness, light-headedness, numbness and headaches.  Psychiatric/Behavioral:       Denies depressed or anxious mood   Past Medical History:  Diagnosis Date  . Arthritis   . Bladder neck contracture   . Cataract immature BILATERAL  . Coronary artery disease CARDIOLOGIST- DR Martinique-  LAST VISIT NOTE 12-20-2010 IN EPIC AND W/ CHART   subendocranial MI; left inter. mamm to LAD, saph vein to diag; saph vein to distal circ; saph vein to 1st obtuse  . DIVERTICULOSIS, COLON 09/05/2003   Qualifier:  Diagnosis of  By: Nolon Rod CMA (AAMA), Robin    . EUSTACHIAN TUBE DYSFUNCTION, BILATERAL 06/22/2008   Qualifier: Diagnosis of  By: Linna Darner MD, William    . EXTERNAL HEMORRHOIDS 12/14/2007   Qualifier: Diagnosis of  By: Nolon Rod CMA (AAMA), Robin    . Frequency of urination   . GERD (gastroesophageal reflux disease)   . Glaucoma BILATERAL  . H/O hiatal hernia   . Hairy cell leukemia ([place])   . History of atherosclerotic cardiovascular disease   . History of kidney stones   . History of myocardial infarction 2006   S/P CABG  . History of prostate cancer S/P PROSTATECTOMY-  NO RECURRENCE   FOLLOWED BY DR Risa Grill  . Hyperlipidemia   . Hypertension   . Hypertriglyceridemia   . Insomnia   . Macular degeneration   . Nocturia   . Raynaud's phenomenon   . Right arm cellulitis 04/02/12   post cat bite  . S/P CABG x 4 2006  . Stable angina ([facility])   . Urgency of urination      Social History   Social History  . Marital status: Married    Spouse name: N/A  . Number of children: 2  . Years of education: N/A   Occupational History  . [employer] [place]   Social History Main Topics  . Smoking status: Former Smoker    Years: 3.00    Types: Cigarettes, Cigars  . Smokeless tobacco: Never Used     Comment: [place]  2007  . Alcohol use 8.4 oz/week    7 Glasses of wine, 7 Shots of liquor per week  . Drug use: No  . Sexual activity: Not on file   Other Topics Concern  . Not on file   Social History Narrative  . No narrative on file    Past Surgical History:  Procedure Laterality Date  . BALLOON DILATION  04/30/2011   Procedure: BALLOON DILATION;  Surgeon: Bernestine Amass, MD;  Location: [facility];  Service: Urology;  Laterality: N/A;  . CARDIAC CATHETERIZATION  07-30-2006  DR Martinique   POST CABG / OCCLUDED DIAGONAL &  FOURTH OBTUSE MARGINAL GRAFTS/ NORMAL LVF/ PATENT LIMA TO LAD & SEPHENOUS VEIN  GRAFT TO 1ST OBTUSE MARGINAL   . CARDIAC CATHETERIZATION  04-23-2004   CRITICAL THREE-VESSEL CAD/ PRESERVED LVF  . CORONARY ARTERY BYPASS GRAFT  04-25-2004  X4 VESSELS   left inter. mamm to LAD, saph vein to diag; saph vein to distal circ; saph vein to 1st obtuse  . CYSTOSCOPY  04/30/2011   Procedure: CYSTOSCOPY;  Surgeon: Bernestine Amass, MD;  Location: [facility];  Service: Urology;  Laterality: N/A;  30 MINUTES   . DEBRIDEMENT TENNIS ELBOW  2002   RIGHT; [facility]  . LAPAROSCOPIC CHOLECYSTECTOMY  02-05-2005  . ROBOT ASSISTED LAPAROSCOPIC RADICAL PROSTATECTOMY  05-11-2006    Family History  Problem Relation Age of Onset  . Heart disease Sister        CABGX 1 sister and stents X1 sister  . Cancer Sister   . Parkinsonism Father   . Diabetes Neg Hx   . Stroke Neg Hx     Allergies  Allergen Reactions  . Clindamycin Other (See Comments)    Unknown reaction from childhood    Current Outpatient Prescriptions on File Prior to Visit  Medication Sig Dispense Refill  . aspirin 81 MG tablet Take 81 mg by mouth daily.    . isosorbide mononitrate (IMDUR) 60 MG 24 hr tablet Take 1 tablet (60 mg total) by mouth daily. 90 tablet 3  . latanoprost (XALATAN) 0.005 % ophthalmic solution Place 1 drop into both eyes at bedtime.     [name] losartan (COZAAR) 25 MG tablet Take 1 tablet (25 mg total) by mouth daily. 90 tablet 1  . LUTEIN PO Take 1 tablet by mouth daily.    . metFORMIN (GLUCOPHAGE) 500 MG tablet Take 1 tablet (500 mg total) by mouth 2 (two) times daily with a meal. 60 tablet 3  . metoprolol succinate (TOPROL-XL) 50 MG 24 hr tablet Take 1 tablet (50 mg total) by mouth every evening. 90 tablet 2  . Multiple Vitamin (MULTIVITAMIN) tablet Take 1 tablet by mouth daily.    . nitroGLYCERIN (NITROSTAT) 0.4 MG SL tablet Place 1 tablet (0.4 mg total) under the tongue every 5 (five) minutes as needed for chest pain (x 3 doses max). 25 tablet 3  . omeprazole (PRILOSEC) 20 MG capsule TAKE  1 CAPSULE BY MOUTH IN THE EVENING 90 capsule 0  . simvastatin (ZOCOR) 20 MG tablet TAKE ONE TABLET BY MOUTH ONCE DAILY IN THE EVENING 90 tablet 2   No current facility-administered medications on file prior to visit.     BP 118/78 (BP Location: Left Arm, Patient Position: Sitting, Cuff Size: Normal)   Pulse 64   Temp 97.7 F (36.5 C) (Oral)   Wt 199 lb (90.3 kg)   SpO2 96%   BMI 27.75 kg/m  Objective:   Physical Exam  Constitutional: He is oriented to person, place, and time. He appears well-developed and well-nourished.  Eyes: Pupils are equal, round, and reactive to light. No scleral icterus.  Neck: Neck supple.  Cardiovascular: Normal rate, regular rhythm and intact distal pulses.   Pulmonary/Chest: Effort normal and breath sounds normal. He has no wheezes. He has no rales.  Abdominal: Soft. Bowel sounds are normal. There is no tenderness. There is no rebound.  Musculoskeletal: He exhibits no edema.  Lymphadenopathy:    He has no cervical adenopathy.  Neurological: He is alert and oriented to person, place, and time.  II-Visual fields grossly intact. III/IV/VI-Extraocular movements intact. Pupils reactive bilaterally. V/VII-Smile symmetric, equal eyebrow raise, facial sensation intact VIII- Hearing grossly intact XI-bilateral shoulder shrug XII-midline tongue extension Motor: 5/5 bilaterally with normal tone and bulk Cerebellar: Normal finger-to-nose   Romberg negative Ambulates with a coordinated gait  Skin: Skin is warm and dry. No rash noted.  Psychiatric: He has a normal mood and affect. His behavior is normal. Judgment and thought content normal.    Positive Dix Hallpike noted that produced vertigo and nystagmus     Assessment & Plan:  1. BPPV (benign paroxysmal positional vertigo), unspecified laterality Positive Marye Round with nystagmus with symptoms related to head movement. Neuro exam is reassuring today. Symptom is also improving per patient.  Suspect symptom is most likely related to BPPV. Review of orthostatic blood pressures were 120/72 laying down, 122/74 sitting, and 118/80 standing. Reviewed the importance of getting up slowly and also advised him to sit on the side of the bed before standing and move his foot back and forth between toes and heels 10 times before standing up. Further advised that he increase his water intake and until urine is pale yellow or clear and avoid excessive heat. Suspect that nausea is related to vertigo and advised patient to follow up if symptoms do not improve with vestibular rehab exercises provided and short course of meclizine. He has an upcoming appointment with PCP for DM management also. - meclizine (ANTIVERT) 12.5 MG tablet; Take 1 tablet (12.5 mg total) by mouth 3 (three) times daily as needed for dizziness.  Dispense: 30 tablet; Refill: 0  Delano Metz, FNP-C

## 2016-08-18 NOTE — Telephone Encounter (Signed)
Pt has been scheduled.  °

## 2016-09-01 ENCOUNTER — Encounter: Payer: Self-pay | Admitting: Family Medicine

## 2016-09-01 ENCOUNTER — Ambulatory Visit (INDEPENDENT_AMBULATORY_CARE_PROVIDER_SITE_OTHER): Payer: Medicare Other | Admitting: Family Medicine

## 2016-09-01 DIAGNOSIS — C9141 Hairy cell leukemia, in remission: Secondary | ICD-10-CM | POA: Diagnosis not present

## 2016-09-01 DIAGNOSIS — E1165 Type 2 diabetes mellitus with hyperglycemia: Secondary | ICD-10-CM

## 2016-09-01 NOTE — Assessment & Plan Note (Signed)
S: Patient with continued fatigue. He states slightly winded at times with stairs but not worsening. He has seen cardiology recently- symptoms not thought to be cardiac- additionally no chest pain.  A/P: suspect his fatigue is likely from hairy cell leukemia/pancytopenia. We discussed getting labs but he is getting them later this month with [facility] tso wants to defer until then.

## 2016-09-01 NOTE — Progress Notes (Signed)
Subjective:  [name] is a 82 y.o. year old very pleasant male patient who presents for/with See problem oriented charting ROS- no chest pain or abnormal diaphoresis. No left arm or neck pain. Some fatigue. Winded at times with stairs.    Past Medical History-  Patient Active Problem List   Diagnosis Date Noted  . Uncontrolled type 2 diabetes mellitus with hyperglycemia, without long-term current use of insulin ([place]) 07/31/2016    Priority: High  . Hairy cell leukemia ([place]) 04/01/2012    Priority: High  . Pancytopenia ([place]) 03/25/2012    Priority: High  . Coronary artery disease     Priority: High  . History of prostate cancer 05/15/2015    Priority: Medium  . Hyperglycemia 05/15/2015    Priority: Medium  . GERD (gastroesophageal reflux disease) 04/25/2015    Priority: Medium  . IBS (irritable bowel syndrome) 07/21/2012    Priority: Medium  . Hypertension     Priority: Medium  . Hypercholesterolemia 04/20/2006    Priority: Medium  . Muscle cramping 01/23/2015    Priority: Low  . Raynaud's phenomenon 02/10/2012    Priority: Low  . Essential tremor 12/20/2010    Priority: Low  . Personal history of colonic polyps 12/15/2007    Priority: Low  . Hollenhorst plaque, right eye 08/14/2015    Medications- reviewed and updated Current Outpatient Prescriptions  Medication Sig Dispense Refill  . aspirin 81 MG tablet Take 81 mg by mouth daily.    . isosorbide mononitrate (IMDUR) 60 MG 24 hr tablet Take 1 tablet (60 mg total) by mouth daily. 90 tablet 3  . latanoprost (XALATAN) 0.005 % ophthalmic solution Place 1 drop into both eyes at bedtime.     [name] losartan (COZAAR) 25 MG tablet Take 1 tablet (25 mg total) by mouth daily. 90 tablet 1  . LUTEIN PO Take 1 tablet by mouth daily.    . meclizine (ANTIVERT) 12.5 MG tablet Take 1 tablet (12.5 mg total) by mouth 3 (three) times daily as needed for dizziness. 30 tablet 0  . metFORMIN (GLUCOPHAGE) 500 MG tablet Take 1 tablet (500 mg  total) by mouth 2 (two) times daily with a meal. 60 tablet 3  . metoprolol succinate (TOPROL-XL) 50 MG 24 hr tablet Take 1 tablet (50 mg total) by mouth every evening. 90 tablet 2  . Multiple Vitamin (MULTIVITAMIN) tablet Take 1 tablet by mouth daily.    . nitroGLYCERIN (NITROSTAT) 0.4 MG SL tablet Place 1 tablet (0.4 mg total) under the tongue every 5 (five) minutes as needed for chest pain (x 3 doses max). 25 tablet 3  . omeprazole (PRILOSEC) 20 MG capsule TAKE 1 CAPSULE BY MOUTH IN THE EVENING 90 capsule 0  . simvastatin (ZOCOR) 20 MG tablet TAKE ONE TABLET BY MOUTH ONCE DAILY IN THE EVENING 90 tablet 2   No current facility-administered medications for this visit.     Objective: BP 138/88 (BP Location: Left Arm, Patient Position: Sitting, Cuff Size: Normal)   Pulse 66   Temp (!) 97.5 F (36.4 C) (Oral)   Wt 199 lb (90.3 kg)   SpO2 97%   BMI 27.75 kg/m  Gen: NAD, resting comfortably CV: RRR no rubs or gallops. Lungs: CTAB no crackles, wheeze, rhonchi Abdomen: soft/nontender/nondistended/normal bowel sounds.  Ext: no edema Skin: warm, dry Neuro: normal gait, hard of hearing  Assessment/Plan:  Uncontrolled type 2 diabetes mellitus with hyperglycemia, without long-term current use of insulin ([place]) S: last visit a1c had swung up  on no rx. We started metformin 500mg  BID. Encouraged limiting alcohol to once a day down form 3x a day (he cut in half). Encouraged to cut sweet tea (unsweet tea with spenda istead). Has cut desserts/cookies  Most post meals <180 except when at beach and eating larger meals. Fasting and premeal often under 130 but some higher than this.  Lab Results  Component Value Date   HGBA1C 8.8 (H) 07/17/2016  A/P: suspect much improved- continue current meds and recheck 3 months  Hairy cell leukemia ([place]) S: Patient with continued fatigue. He states slightly winded at times with stairs but not worsening. He has seen cardiology recently- symptoms not thought to be  cardiac- additionally no chest pain.  A/P: suspect his fatigue is likely from hairy cell leukemia/pancytopenia. We discussed getting labs but he is getting them later this month with [facility] tso wants to defer until then.    3 month  Return precautions advised.  Garret Reddish, MD

## 2016-09-01 NOTE — Patient Instructions (Signed)
With your fatigue tempted to get labs but since [facility] is doing full labs soon will hold off  Sugars sound like they are doing much better. See me in 3 months and we will prick your finger to recheck.   Please let me know if the fatigue or windedness worsens- likely get into cardiology if labs do not show cause of the fatigue

## 2016-09-01 NOTE — Assessment & Plan Note (Signed)
S: last visit a1c had swung up on no rx. We started metformin 500mg  BID. Encouraged limiting alcohol to once a day down form 3x a day (he cut in half). Encouraged to cut sweet tea (unsweet tea with spenda istead). Has cut desserts/cookies  Most post meals <180 except when at beach and eating larger meals. Fasting and premeal often under 130 but some higher than this.  Lab Results  Component Value Date   HGBA1C 8.8 (H) 07/17/2016  A/P: suspect much improved- continue current meds and recheck 3 months

## 2016-09-02 ENCOUNTER — Encounter: Payer: Medicare Other | Attending: Family Medicine | Admitting: Dietician

## 2016-09-02 ENCOUNTER — Encounter: Payer: Self-pay | Admitting: Dietician

## 2016-09-02 DIAGNOSIS — E1165 Type 2 diabetes mellitus with hyperglycemia: Secondary | ICD-10-CM | POA: Insufficient documentation

## 2016-09-02 DIAGNOSIS — Z713 Dietary counseling and surveillance: Secondary | ICD-10-CM | POA: Insufficient documentation

## 2016-09-02 NOTE — Progress Notes (Addendum)
Subjective:   NERI SAMEK is a 82 y.o. male who presents for Medicare Annual/Subsequent preventive examination.  The Patient was informed that the wellness visit is to identify future health risk and educate and initiate measures that can reduce risk for increased disease through the lifespan.    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   Dr. At [facility] wanted him to have a special type of surgery and he is declining now. Feels overwhelming him to consider surgery although admits urinary incontinence is frustrating.   VS reviewed;   Diet "uncontrolled DM"  Trig 299 Started on Metformin bid A1c 8.8   BMI -27   Exercise undergoing a lot of medicare treatment   Has generally been active  Can't play golf Has some fatigue and did discuss with cardiologist    Stressors: seeing lots of doctors  Expresses fatigue and not being able to get back on track due to so many doctor's apt.   DM eye exam 03/2016    Cardiac Risk Factors Addressed Hyperlipidemia - chol/hdl 3.9// hdl 33; ldl 35; trig 299 Diabetes    Patient Care Team: Marin Olp, MD as PCP - General (Family Medicine) Assessed for additional providers  Immunization History  Administered Date(s) Administered  . Influenza Whole 10/23/2008, 10/12/2009  . Influenza, High Dose Seasonal PF 11/01/2015  . Pneumococcal Conjugate-13 11/01/2015  . Pneumococcal  Polysaccharide-23 10/18/2007  . Td 03/25/2012   Required Immunizations needed today  Screening test up to date or reviewed for plan of completion Health Maintenance Due  Topic Date Due  . FOOT EXAM  04/05/1944  . INFLUENZA VACCINE  08/13/2016    Waived foot exam in lieu of time and other patient education     Cardiac Risk Factors include: advanced age (>22men, >9 women);diabetes mellitus;dyslipidemia;hypertension;male gender;family history of premature cardiovascular disease     Objective:    Vitals: BP 100/60   Pulse 66   Ht 5\' 11"  (1.803 m)   Wt 199 lb (90.3 kg)   SpO2 97%   BMI 27.75 kg/m   Body mass index is 27.75 kg/m.  Tobacco History  Smoking Status  . Former Smoker  . Years: 3.00  . Types: Cigarettes, Cigars  Smokeless Tobacco  . Never Used    Comment: [place] AND QUIT OCCASIONAL CIGAR  2007     Counseling given: Yes   Past Medical History:  Diagnosis Date  . Arthritis   . Bladder neck contracture   . Cataract immature BILATERAL  . Coronary artery disease CARDIOLOGIST- DR Martinique-  LAST VISIT NOTE 12-20-2010 IN EPIC AND W/ CHART   subendocranial MI; left inter. mamm to LAD, saph vein to diag; saph vein to distal circ; saph vein to 1st obtuse  . Diabetes mellitus without complication ([place])   . DIVERTICULOSIS, COLON 09/05/2003   Qualifier: Diagnosis of  By: Nolon Rod CMA (AAMA), Robin    . EUSTACHIAN TUBE  DYSFUNCTION, BILATERAL 06/22/2008   Qualifier: Diagnosis of  By: Linna Darner MD, William    . EXTERNAL HEMORRHOIDS 12/14/2007   Qualifier: Diagnosis of  By: Nolon Rod CMA (AAMA), Robin    . Frequency of urination   . GERD (gastroesophageal reflux disease)   . Glaucoma BILATERAL  . H/O hiatal hernia   . 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 History of atherosclerotic cardiovascular disease   . History of kidney stones   . History of myocardial infarction 2006   S/P CABG  . History of prostate cancer S/P PROSTATECTOMY-  NO RECURRENCE    FOLLOWED BY DR Risa Grill  . Hyperlipidemia   . Hypertension   . Hypertriglyceridemia   . Insomnia   . Macular degeneration   . Nocturia   . Raynaud's phenomenon   . Right arm cellulitis 04/02/12   post cat bite  . S/P CABG x 4 2006  . Stable angina ([facility])   . Urgency of urination    Past Surgical History:  Procedure Laterality Date  . BALLOON DILATION  04/30/2011   Procedure: BALLOON DILATION;  Surgeon: Bernestine Amass, MD;  Location: [facility];  Service: Urology;  Laterality: N/A;  . CARDIAC CATHETERIZATION  07-30-2006  DR [place]ique   POST CABG / OCCLUDED DIAGONAL &  FOURTH OBTUSE MARGINAL GRAFTS/ NORMAL LVF/ PATENT LIMA TO LAD & SEPHENOUS VEIN GRAFT TO 1ST OBTUSE MARGINAL   . CARDIAC CATHETERIZATION  04-23-2004   CRITICAL THREE-VESSEL CAD/ PRESERVED LVF  . CORONARY ARTERY BYPASS GRAFT  04-25-2004  X4 VESSELS   left inter. mamm to LAD, saph vein to diag; saph vein to distal circ; saph vein to 1st obtuse  . CYSTOSCOPY  04/30/2011   Procedure: CYSTOSCOPY;  Surgeon: Bernestine Amass, MD;  Location: [facility];  Service: Urology;  Laterality: N/A;  30 MINUTES   . DEBRIDEMENT TENNIS ELBOW  2002   RIGHT; [facility]  . LAPAROSCOPIC CHOLECYSTECTOMY  02-05-2005  . ROBOT ASSISTED LAPAROSCOPIC RADICAL PROSTATECTOMY  05-11-2006   Family History  Problem Relation Age of Onset  . Heart disease Sister        CABGX 1 sister and stents X1 sister  . Cancer Sister   . Parkinsonism Father   . Diabetes Neg Hx   . Stroke Neg Hx    History  Sexual Activity  . Sexual activity: Not on file    Outpatient Encounter Prescriptions as of 09/03/2016  Medication Sig  . aspirin 81 MG tablet Take 81 mg by mouth daily.  . isosorbide mononitrate (IMDUR) 60 MG 24 hr tablet Take 1 tablet (60 mg total) by mouth daily.  [name] latanoprost (XALATAN) 0.005 % ophthalmic solution Place 1 drop into both eyes at bedtime.   [name] losartan (COZAAR) 25 MG tablet Take 1 tablet (25 mg total) by mouth  daily.  . LUTEIN PO Take 1 tablet by mouth daily.  . metFORMIN (GLUCOPHAGE) 500 MG tablet Take 1 tablet (500 mg total) by mouth 2 (two) times daily with a meal.  . metoprolol succinate (TOPROL-XL) 50 MG 24 hr tablet Take 1 tablet (50 mg total) by mouth every evening.  . Multiple Vitamin (MULTIVITAMIN) tablet Take 1 tablet by mouth daily.  . nitroGLYCERIN (NITROSTAT) 0.4 MG SL tablet Place 1 tablet (0.4 mg total) under the tongue every 5 (five) minutes as needed for chest pain (x 3 doses max).  [name] omeprazole (PRILOSEC) 20 MG capsule TAKE 1 CAPSULE BY MOUTH IN THE EVENING  . simvastatin (ZOCOR) 20  MG tablet TAKE ONE TABLET BY MOUTH ONCE DAILY IN THE EVENING  . meclizine (ANTIVERT) 12.5 MG tablet Take 1 tablet (12.5 mg total) by mouth 3 (three) times daily as needed for dizziness. (Patient not taking: Reported on 09/03/2016)   No facility-administered encounter medications on file as of 09/03/2016.     Activities of Daily Living In your present state of health, do you have any difficulty performing the following activities: 09/03/2016  Hearing? N  Vision? N  Difficulty concentrating or making decisions? N  Walking or climbing stairs? N  Dressing or bathing? N  Doing errands, shopping? N  Preparing Food and eating ? N  Using the Toilet? N  In the past six months, have you accidently leaked urine? Y  Do you have problems with loss of bowel control? N  Managing your Medications? N  Managing your Finances? N  Housekeeping or managing your Housekeeping? N  Some recent data might be hidden    Patient Care Team: Marin Olp, MD as PCP - General (Family Medicine)   Assessment:     Exercise Activities and Dietary recommendations Current Exercise Habits: Home exercise routine, Intensity: Mild  Goals    . patient          Good luck looking for new passions [name] watching or other Try to stay engage -      . patient          Will feel empowered and take more control my your  health!      Fall Risk Fall Risk  09/02/2016 04/14/2016 02/13/2015  Falls in the past year? No No No   Depression Screen PHQ 2/9 Scores 09/02/2016 04/14/2016 02/13/2015  PHQ - 2 Score 0 0 0    Cognitive Function   Ad8 score reviewed for issues:  Issues making decisions:  Less interest in hobbies / activities:  Repeats questions, stories (family complaining):  Trouble using ordinary gadgets (microwave, computer, phone):  Forgets the month or year:   Mismanaging finances:   Remembering appts:  Daily problems with thinking and/or memory: Ad8 score is=0          Immunization History  Administered Date(s) Administered  . Influenza Whole 10/23/2008, 10/12/2009  . Influenza, High Dose Seasonal PF 11/01/2015  . Pneumococcal Conjugate-13 11/01/2015  . Pneumococcal Polysaccharide-23 10/18/2007  . Td 03/25/2012   Screening Tests Health Maintenance  Topic Date Due  . FOOT EXAM  04/05/1944  . INFLUENZA VACCINE  08/13/2016  . HEMOGLOBIN A1C  01/17/2017  . OPHTHALMOLOGY EXAM  04/07/2017  . TETANUS/TDAP  03/26/2022  . PNA vac Low Risk Adult  Completed      Plan:      PCP Notes   Health Maintenance Waived the foot exam today due to time but did talk to the patient about foot care and diabetes. Will postpone the foot exam until his next office visit in January.  Educated regarding the shingrix. Will discuss with oncologist and probably take this at a later time.  Doesn't have some apparent problems with hearing but does not want to address these at this time. Educated Medicare will pay for a hearing screen when he is ready.    Abnormal Screens   none noted  Referrals  No referrals  Patient concerns; The patient spent a lot of time discussing his frustration over his medical issues over the last couple of years. Continues to feel fatigued. Encouraged to make a list of his questions when he was in with the  physicians. Encouraged to take more control of his health  and continue to try to engage in things that interest him. Denies depression but does seem to be having some issues adjusting to his continued aging and ongoing  medical hardships. Assisted him with prior to having some of his concerns and making plans for follow-up with physicians as appropriate. States he is bothered by his urinary frequency and some incontinence. Has declined surgery by urology at this time due to feeling overwhelmed.  Nurse Concerns; As noted  Next PCP October 22       I have personally reviewed and noted the following in the patient's chart:   . Medical and social history . Use of alcohol, tobacco or illicit drugs  . Current medications and supplements . Functional ability and status . Nutritional status . Physical activity . Advanced directives . List of other physicians . Hospitalizations, surgeries, and ER visits in previous 12 months . Vitals . Screenings to include cognitive, depression, and falls . Referrals and appointments  In addition, I have reviewed and discussed with patient certain preventive protocols, quality metrics, and best practice recommendations. A written personalized care plan for preventive services as well as general preventive health recommendations were provided to patient.     PQZRA,QTMAU, RN  09/03/2016  Notes above reviewed in absence of primary provider. Agree with assessment and recommendations  Eulas Post MD [facility]

## 2016-09-02 NOTE — Progress Notes (Signed)
Patient was seen on 09/02/16 for the first of a series of three diabetes self-management courses at the [facility].  Patient Education Plan per assessed needs and concerns is to attend four course education program for Diabetes Self Management Education.  The following learning objectives were met by the patient during this class:  Describe diabetes  State some common risk factors for diabetes  Defines the role of glucose and insulin  Identifies type of diabetes and pathophysiology  Describe the relationship between diabetes and cardiovascular risk  State the members of the Healthcare Team  States the rationale for glucose monitoring  State when to test glucose  State their individual Target Range  State the importance of logging glucose readings  Describe how to interpret glucose readings  Identifies A1C target  Explain the correlation between A1c and eAG values  State symptoms and treatment of high blood glucose  State symptoms and treatment of low blood glucose  Explain proper technique for glucose testing  Identifies proper sharps disposal  Handouts given during class include:  Living Well with Diabetes book  Carb Counting and Meal Planning book  Meal Plan Card  Carbohydrate guide  Meal planning worksheet  Low Sodium Flavoring Tips  The diabetes portion plate  Q0G to eAG Conversion Chart  Diabetes Medications  Diabetes Recommended Care Schedule  Support Group  Diabetes Success Plan  Core Class Satisfaction Survey   . The patient's Newest Vital Sign Health Literacy Assessment score was 1 of 6. . The patient scored 50% on the Diabetes Knowledge pre-test. . Educational strategies utilized during class included repetition, teach-back, eliminating medical jargon, and being open to questions.   Follow-Up Plan:  Attend core 2

## 2016-09-03 ENCOUNTER — Encounter: Payer: Self-pay | Admitting: Family Medicine

## 2016-09-03 ENCOUNTER — Ambulatory Visit (INDEPENDENT_AMBULATORY_CARE_PROVIDER_SITE_OTHER): Payer: Medicare Other

## 2016-09-03 VITALS — BP 100/60 | HR 66 | Ht 71.0 in | Wt 199.0 lb

## 2016-09-03 DIAGNOSIS — Z0000 Encounter for general adult medical examination without abnormal findings: Secondary | ICD-10-CM

## 2016-09-03 NOTE — Patient Instructions (Addendum)
[name] , Thank you for taking time to come for your Medicare Wellness Visit. I appreciate your ongoing commitment to your health goals. Please review the following plan we discussed and let me know if I can assist you in the future.   You can have a hearing screen when you can.  Shingrix is a vaccine for the prevention of Shingles in Adults 50 and older.  If you are on Medicare, you can request a prescription from your doctor to be filled at a pharmacy.  Please check with your benefits regarding applicable copays or out of pocket expenses.  The Shingrix is given in 2 vaccines approx 8 weeks apart. You must receive the 2nd dose prior to 6 months from receipt of the first.  He has had shingles.      These are the goals we discussed: Goals    . patient          Good luck looking for new passions [name] watching or other Try to stay engage -      . patient          Will feel empowered and take more control my your health!       This is a list of the screening recommended for you and due dates:  Health Maintenance  Topic Date Due  . Complete foot exam   04/05/1944  . Flu Shot  08/13/2016  . Hemoglobin A1C  01/17/2017  . Eye exam for diabetics  04/07/2017  . Tetanus Vaccine  03/26/2022  . Pneumonia vaccines  Completed   Summary: Preventive Care for Adults  A healthy lifestyle and preventive care can promote health and wellness. Preventive health guidelines for adults include the following key practices.  . A routine yearly physical is a good way to check with your health care provider about your health and preventive screening. It is a chance to share any concerns and updates on your health and to receive a thorough exam.  . Visit your dentist for a routine exam and preventive care every 6 months. Brush your teeth twice a day and floss once a day. Good oral hygiene prevents tooth decay and gum disease.  . The frequency of eye exams is based on your age, health,  family medical history, use  of contact lenses, and other factors. Follow your health care provider's ecommendations for frequency of eye exams.  . Eat a healthy diet. Foods like vegetables, fruits, whole grains, low-fat dairy products, and lean protein foods contain the nutrients you need without too many calories. Decrease your intake of foods high in solid fats, added sugars, and salt. Eat the right amount of calories for you. Get information about a proper diet from your health care provider, if necessary.  . Regular physical exercise is one of the most important things you can do for your health. Most adults should get at least 150 minutes of moderate-intensity exercise (any activity that increases your heart rate and causes you to sweat) each week. In addition, most adults need muscle-strengthening exercises on 2 or more days a week.  Silver Sneakers may be a benefit available to you. To determine eligibility, you may visit the website: www.silversneakers.com or contact program at [phone] Mon-Fri between 8AM-8PM.   . Maintain a healthy weight. The body mass index (BMI) is a screening tool to identify possible weight problems. It provides an estimate of body fat based on height and weight. Your health care provider can find your BMI and can help  you achieve or maintain a healthy weight.   For adults 20 years and older: ? A BMI below 18.5 is considered underweight. ? A BMI of 18.5 to 24.9 is normal. ? A BMI of 27 to 28 is considered normal by the Institutes of Health  ? A BMI of 30 and above is considered obese.   . Maintain normal blood lipids and cholesterol levels by exercising and minimizing your intake of saturated fat. Eat a balanced diet with plenty of fruit and vegetables. Blood tests for lipids and cholesterol should begin at age 44 and be repeated every 5 years. If your lipid or cholesterol levels are high, you are over 50, or you are at high risk for heart disease, you  may need your cholesterol levels checked more frequently. Ongoing high lipid and cholesterol levels should be treated with medicines if diet and exercise are not working.  . If you smoke, find out from your health care provider how to quit. If you do not use tobacco, please do not start.  . If you choose to drink alcohol, please do not consume more than one drink for women and 2 for men.  One drink is considered to be 12 ounces (355 mL) of beer, 5 ounces (148 mL) of wine, or 1.5 ounces (44 mL) of liquor. Moderation of alcohol intake to this level decreases your risk of breast cancer and liver damage.   . If you are 65-72 years old, ask your health care provider if you should take aspirin to prevent strokes.  . Use sunscreen. Apply sunscreen liberally and repeatedly throughout the day. You should seek shade when your shadow is shorter than you. Protect yourself by wearing long sleeves, pants, a wide-brimmed hat, and sunglasses year round, whenever you are outdoors.  . Once a month, do a whole body skin exam, using a mirror to look at the skin on your back. Tell your health care provider of new moles, moles that have irregular borders, moles that are larger than a pencil eraser, or moles that have changed in shape or color.  Last, if you have completed an Advanced Directive; please bring a copy and review with your physician and then we will scan to the medical record       Fall Prevention in the [place] can cause injuries and can affect people from all age groups. There are many simple things that you can do to make your home safe and to help prevent falls. What can I do on the outside of my home?  Regularly repair the edges of walkways and driveways and fix any cracks.  Remove high doorway thresholds.  Trim any shrubbery on the main path into your home.  Use bright outdoor lighting.  Clear walkways of debris and clutter, including tools and rocks.  Regularly check that  handrails are securely fastened and in good repair. Both sides of any steps should have handrails.  Install guardrails along the edges of any raised decks or porches.  Have leaves, snow, and ice cleared regularly.  Use sand or salt on walkways during winter months.  In the garage, clean up any spills right away, including grease or oil spills. What can I do in the bathroom?  Use night lights.  Install grab bars by the toilet and in the tub and shower. Do not use towel bars as grab bars.  Use non-skid mats or decals on the floor of the tub or shower.  If you need to sit down  while you are in the shower, use a plastic, non-slip stool.  Keep the floor dry. Immediately clean up any water that spills on the floor.  Remove soap buildup in the tub or shower on a regular basis.  Attach bath mats securely with double-sided non-slip rug tape.  Remove throw rugs and other tripping hazards from the floor. What can I do in the bedroom?  Use night lights.  Make sure that a bedside light is easy to reach.  Do not use oversized bedding that drapes onto the floor.  Have a firm chair that has side arms to use for getting dressed.  Remove throw rugs and other tripping hazards from the floor. What can I do in the kitchen?  Clean up any spills right away.  Avoid walking on wet floors.  Place frequently used items in easy-to-reach places.  If you need to reach for something above you, use a sturdy step stool that has a grab bar.  Keep electrical cables out of the way.  Do not use floor polish or wax that makes floors slippery. If you have to use wax, make sure that it is non-skid floor wax.  Remove throw rugs and other tripping hazards from the floor. What can I do in the stairways?  Do not leave any items on the stairs.  Make sure that there are handrails on both sides of the stairs. Fix handrails that are broken or loose. Make sure that handrails are as long as the  stairways.  Check any carpeting to make sure that it is firmly attached to the stairs. Fix any carpet that is loose or worn.  Avoid having throw rugs at the top or bottom of stairways, or secure the rugs with carpet tape to prevent them from moving.  Make sure that you have a light switch at the top of the stairs and the bottom of the stairs. If you do not have them, have them installed. What are some other fall prevention tips?  Wear closed-toe shoes that fit well and support your feet. Wear shoes that have rubber soles or low heels.  When you use a stepladder, make sure that it is completely opened and that the sides are firmly locked. Have someone hold the ladder while you are using it. Do not climb a closed stepladder.  Add color or contrast paint or tape to grab bars and handrails in your home. Place contrasting color strips on the first and last steps.  Use mobility aids as needed, such as canes, walkers, scooters, and crutches.  Turn on lights if it is dark. Replace any light bulbs that burn out.  Set up furniture so that there are clear paths. Keep the furniture in the same spot.  Fix any uneven floor surfaces.  Choose a carpet design that does not hide the edge of steps of a stairway.  Be aware of any and all pets.  Review your medicines with your healthcare provider. Some medicines can cause dizziness or changes in blood pressure, which increase your risk of falling. Talk with your health care provider about other ways that you can decrease your risk of falls. This may include working with a physical therapist or trainer to improve your strength, balance, and endurance. This information is not intended to replace advice given to you by your health care provider. Make sure you discuss any questions you have with your health care provider. Document Released: 12/20/2001 Document Revised: 05/29/2015 Document Reviewed: 02/03/2014 Elsevier Interactive Patient Education  2017  [employer].

## 2016-09-08 ENCOUNTER — Encounter (INDEPENDENT_AMBULATORY_CARE_PROVIDER_SITE_OTHER): Payer: Medicare Other | Admitting: Ophthalmology

## 2016-09-08 DIAGNOSIS — H43813 Vitreous degeneration, bilateral: Secondary | ICD-10-CM

## 2016-09-08 DIAGNOSIS — H353122 Nonexudative age-related macular degeneration, left eye, intermediate dry stage: Secondary | ICD-10-CM

## 2016-09-08 DIAGNOSIS — H353211 Exudative age-related macular degeneration, right eye, with active choroidal neovascularization: Secondary | ICD-10-CM

## 2016-09-08 DIAGNOSIS — H35033 Hypertensive retinopathy, bilateral: Secondary | ICD-10-CM

## 2016-09-08 DIAGNOSIS — I10 Essential (primary) hypertension: Secondary | ICD-10-CM | POA: Diagnosis not present

## 2016-09-09 ENCOUNTER — Encounter: Payer: Medicare Other | Admitting: Dietician

## 2016-09-09 ENCOUNTER — Other Ambulatory Visit: Payer: Self-pay

## 2016-09-09 DIAGNOSIS — Z713 Dietary counseling and surveillance: Secondary | ICD-10-CM | POA: Diagnosis not present

## 2016-09-09 DIAGNOSIS — E1165 Type 2 diabetes mellitus with hyperglycemia: Secondary | ICD-10-CM

## 2016-09-09 MED ORDER — METFORMIN HCL 500 MG PO TABS: 500.0000 mg | ORAL_TABLET | Freq: Two times a day (BID) | ORAL | 3 refills | Status: DC

## 2016-09-09 NOTE — Progress Notes (Signed)
Patient was seen on 09/09/16 for the second of a series of three diabetes self-management courses at the [facility]. The following learning objectives were met by the patient during this class:   Describe the role of different macronutrients on glucose  Explain how carbohydrates affect blood glucose  State what foods contain the most carbohydrates  Demonstrate carbohydrate counting  Demonstrate how to read Nutrition Facts food label  Describe effects of various fats on heart health  Describe the importance of good nutrition for health and healthy eating strategies  Describe techniques for managing your shopping, cooking and meal planning  List strategies to follow meal plan when dining out  Describe the effects of alcohol on glucose and how to use it safely  Goals:  Follow Diabetes Meal Plan as instructed  Aim to spread carbs evenly throughout the day  Aim for 3 meals per day and snacks as needed Include lean protein foods to meals/snacks  Monitor glucose levels as instructed by your doctor   Follow-Up Plan:  Attend Core 3  Work towards following your personal food plan.   

## 2016-09-10 DIAGNOSIS — C9141 Hairy cell leukemia, in remission: Secondary | ICD-10-CM | POA: Diagnosis not present

## 2016-09-11 DIAGNOSIS — Z7982 Long term (current) use of aspirin: Secondary | ICD-10-CM | POA: Diagnosis not present

## 2016-09-11 DIAGNOSIS — I2510 Atherosclerotic heart disease of native coronary artery without angina pectoris: Secondary | ICD-10-CM | POA: Diagnosis not present

## 2016-09-11 DIAGNOSIS — Z9221 Personal history of antineoplastic chemotherapy: Secondary | ICD-10-CM | POA: Diagnosis not present

## 2016-09-11 DIAGNOSIS — L570 Actinic keratosis: Secondary | ICD-10-CM | POA: Diagnosis not present

## 2016-09-11 DIAGNOSIS — H3560 Retinal hemorrhage, unspecified eye: Secondary | ICD-10-CM | POA: Diagnosis not present

## 2016-09-11 DIAGNOSIS — Z79899 Other long term (current) drug therapy: Secondary | ICD-10-CM | POA: Diagnosis not present

## 2016-09-11 DIAGNOSIS — H3530 Unspecified macular degeneration: Secondary | ICD-10-CM | POA: Diagnosis not present

## 2016-09-11 DIAGNOSIS — Z951 Presence of aortocoronary bypass graft: Secondary | ICD-10-CM | POA: Diagnosis not present

## 2016-09-11 DIAGNOSIS — Z1509 Genetic susceptibility to other malignant neoplasm: Secondary | ICD-10-CM | POA: Diagnosis not present

## 2016-09-11 DIAGNOSIS — C9142 Hairy cell leukemia, in relapse: Secondary | ICD-10-CM | POA: Diagnosis not present

## 2016-09-12 ENCOUNTER — Other Ambulatory Visit: Payer: Self-pay

## 2016-09-12 MED ORDER — ONETOUCH ULTRASOFT LANCETS MISC: 4 refills | Status: DC

## 2016-09-12 MED ORDER — GLUCOSE BLOOD VI STRP: ORAL_STRIP | 12 refills | Status: DC

## 2016-09-16 ENCOUNTER — Encounter: Payer: Medicare Other | Attending: Family Medicine | Admitting: Dietician

## 2016-09-16 ENCOUNTER — Other Ambulatory Visit: Payer: Self-pay

## 2016-09-16 DIAGNOSIS — Z713 Dietary counseling and surveillance: Secondary | ICD-10-CM | POA: Insufficient documentation

## 2016-09-16 DIAGNOSIS — E1165 Type 2 diabetes mellitus with hyperglycemia: Secondary | ICD-10-CM | POA: Diagnosis not present

## 2016-09-16 MED ORDER — ONETOUCH ULTRASOFT LANCETS MISC: 4 refills | Status: AC

## 2016-09-16 MED ORDER — GLUCOSE BLOOD VI STRP: ORAL_STRIP | 12 refills | Status: DC

## 2016-09-16 NOTE — Progress Notes (Signed)
Patient was seen on 09/16/16 for the third of a series of three diabetes self-management courses at the [facility].   Catalina Gravel the amount of activity recommended for healthy living . Describe activities suitable for individual needs . Identify ways to regularly incorporate activity into daily life . Identify barriers to activity and ways to over come these barriers  Identify diabetes medications being personally used and their primary action for lowering glucose and possible side effects . Describe role of stress on blood glucose and develop strategies to address psychosocial issues . Identify diabetes complications and ways to prevent them  Explain how to manage diabetes during illness . Evaluate success in meeting personal goal . Establish 2-3 goals that they will plan to diligently work on until they return for the  56-month follow-up visit  Goals:   I will count my carb choices at most meals and snacks  I will be active 30 minutes or more 5 times a week  I will take my diabetes medications as scheduled  I will eat less unhealthy fats.  I will test my glucose at least 1 time a day.  Your patient has identified these potential barriers to change:  Stress  Your patient has identified their diabetes self-care support plan as  On-line Resources  . The patient scored 72% on the Diabetes Knowledge post-test.   Plan:  Attend Support Group as desired

## 2016-09-18 DIAGNOSIS — C9142 Hairy cell leukemia, in relapse: Secondary | ICD-10-CM | POA: Diagnosis not present

## 2016-10-06 DIAGNOSIS — D62 Acute posthemorrhagic anemia: Secondary | ICD-10-CM | POA: Diagnosis not present

## 2016-10-06 DIAGNOSIS — T887XXA Unspecified adverse effect of drug or medicament, initial encounter: Secondary | ICD-10-CM | POA: Diagnosis not present

## 2016-10-06 DIAGNOSIS — Z87891 Personal history of nicotine dependence: Secondary | ICD-10-CM | POA: Diagnosis not present

## 2016-10-06 DIAGNOSIS — R040 Epistaxis: Secondary | ICD-10-CM | POA: Diagnosis not present

## 2016-10-06 DIAGNOSIS — R0682 Tachypnea, not elsewhere classified: Secondary | ICD-10-CM | POA: Diagnosis not present

## 2016-10-06 DIAGNOSIS — D61818 Other pancytopenia: Secondary | ICD-10-CM | POA: Diagnosis not present

## 2016-10-06 DIAGNOSIS — C9142 Hairy cell leukemia, in relapse: Secondary | ICD-10-CM | POA: Diagnosis not present

## 2016-10-06 DIAGNOSIS — I10 Essential (primary) hypertension: Secondary | ICD-10-CM | POA: Diagnosis not present

## 2016-10-06 DIAGNOSIS — R7881 Bacteremia: Secondary | ICD-10-CM | POA: Diagnosis not present

## 2016-10-06 DIAGNOSIS — E119 Type 2 diabetes mellitus without complications: Secondary | ICD-10-CM | POA: Diagnosis not present

## 2016-10-06 DIAGNOSIS — Z79899 Other long term (current) drug therapy: Secondary | ICD-10-CM | POA: Diagnosis not present

## 2016-10-06 DIAGNOSIS — E440 Moderate protein-calorie malnutrition: Secondary | ICD-10-CM | POA: Diagnosis not present

## 2016-10-06 DIAGNOSIS — R58 Hemorrhage, not elsewhere classified: Secondary | ICD-10-CM | POA: Diagnosis not present

## 2016-10-06 DIAGNOSIS — D61810 Antineoplastic chemotherapy induced pancytopenia: Secondary | ICD-10-CM | POA: Diagnosis not present

## 2016-10-06 DIAGNOSIS — D500 Iron deficiency anemia secondary to blood loss (chronic): Secondary | ICD-10-CM | POA: Diagnosis not present

## 2016-10-06 DIAGNOSIS — Z5111 Encounter for antineoplastic chemotherapy: Secondary | ICD-10-CM | POA: Diagnosis not present

## 2016-10-06 DIAGNOSIS — M79601 Pain in right arm: Secondary | ICD-10-CM | POA: Diagnosis not present

## 2016-10-06 DIAGNOSIS — D709 Neutropenia, unspecified: Secondary | ICD-10-CM | POA: Diagnosis not present

## 2016-10-06 DIAGNOSIS — Z7984 Long term (current) use of oral hypoglycemic drugs: Secondary | ICD-10-CM | POA: Diagnosis not present

## 2016-10-06 DIAGNOSIS — Z8546 Personal history of malignant neoplasm of prostate: Secondary | ICD-10-CM | POA: Diagnosis not present

## 2016-10-06 DIAGNOSIS — I2510 Atherosclerotic heart disease of native coronary artery without angina pectoris: Secondary | ICD-10-CM | POA: Diagnosis not present

## 2016-10-06 DIAGNOSIS — L03113 Cellulitis of right upper limb: Secondary | ICD-10-CM | POA: Diagnosis not present

## 2016-10-06 DIAGNOSIS — Z7982 Long term (current) use of aspirin: Secondary | ICD-10-CM | POA: Diagnosis not present

## 2016-10-06 DIAGNOSIS — T827XXA Infection and inflammatory reaction due to other cardiac and vascular devices, implants and grafts, initial encounter: Secondary | ICD-10-CM | POA: Diagnosis not present

## 2016-10-06 DIAGNOSIS — E1165 Type 2 diabetes mellitus with hyperglycemia: Secondary | ICD-10-CM | POA: Diagnosis not present

## 2016-10-06 DIAGNOSIS — E872 Acidosis: Secondary | ICD-10-CM | POA: Diagnosis not present

## 2016-10-06 DIAGNOSIS — M7989 Other specified soft tissue disorders: Secondary | ICD-10-CM | POA: Diagnosis not present

## 2016-10-06 DIAGNOSIS — R05 Cough: Secondary | ICD-10-CM | POA: Diagnosis not present

## 2016-10-06 DIAGNOSIS — K921 Melena: Secondary | ICD-10-CM | POA: Diagnosis not present

## 2016-10-08 ENCOUNTER — Emergency Department (HOSPITAL_COMMUNITY): Payer: Medicare Other

## 2016-10-08 ENCOUNTER — Encounter (HOSPITAL_COMMUNITY): Payer: Self-pay

## 2016-10-08 ENCOUNTER — Encounter (INDEPENDENT_AMBULATORY_CARE_PROVIDER_SITE_OTHER): Payer: Medicare Other | Admitting: Ophthalmology

## 2016-10-08 ENCOUNTER — Emergency Department (HOSPITAL_COMMUNITY)
Admission: EM | Admit: 2016-10-08 | Discharge: 2016-10-08 | Disposition: A | Discharge status: Nursing Home (Includes ICF) | Payer: Medicare Other | Attending: Emergency Medicine | Admitting: Emergency Medicine

## 2016-10-08 DIAGNOSIS — R58 Hemorrhage, not elsewhere classified: Secondary | ICD-10-CM | POA: Diagnosis not present

## 2016-10-08 DIAGNOSIS — Z79899 Other long term (current) drug therapy: Secondary | ICD-10-CM | POA: Diagnosis not present

## 2016-10-08 DIAGNOSIS — R0682 Tachypnea, not elsewhere classified: Secondary | ICD-10-CM | POA: Diagnosis not present

## 2016-10-08 DIAGNOSIS — Z8546 Personal history of malignant neoplasm of prostate: Secondary | ICD-10-CM | POA: Insufficient documentation

## 2016-10-08 DIAGNOSIS — E1165 Type 2 diabetes mellitus with hyperglycemia: Secondary | ICD-10-CM | POA: Diagnosis not present

## 2016-10-08 DIAGNOSIS — D709 Neutropenia, unspecified: Secondary | ICD-10-CM | POA: Diagnosis not present

## 2016-10-08 DIAGNOSIS — R05 Cough: Secondary | ICD-10-CM | POA: Diagnosis not present

## 2016-10-08 DIAGNOSIS — R040 Epistaxis: Secondary | ICD-10-CM | POA: Diagnosis not present

## 2016-10-08 DIAGNOSIS — I2510 Atherosclerotic heart disease of native coronary artery without angina pectoris: Secondary | ICD-10-CM | POA: Diagnosis not present

## 2016-10-08 DIAGNOSIS — Z7984 Long term (current) use of oral hypoglycemic drugs: Secondary | ICD-10-CM | POA: Diagnosis not present

## 2016-10-08 DIAGNOSIS — Z87891 Personal history of nicotine dependence: Secondary | ICD-10-CM | POA: Diagnosis not present

## 2016-10-08 DIAGNOSIS — C9142 Hairy cell leukemia, in relapse: Secondary | ICD-10-CM | POA: Diagnosis not present

## 2016-10-08 DIAGNOSIS — I10 Essential (primary) hypertension: Secondary | ICD-10-CM | POA: Insufficient documentation

## 2016-10-08 DIAGNOSIS — D61818 Other pancytopenia: Secondary | ICD-10-CM

## 2016-10-08 DIAGNOSIS — Z7982 Long term (current) use of aspirin: Secondary | ICD-10-CM | POA: Diagnosis not present

## 2016-10-08 LAB — CBC
HCT: 22.2 % — ABNORMAL LOW (ref 39.0–52.0)
HCT: 19.2 % — ABNORMAL LOW (ref 39.0–52.0)
HEMOGLOBIN: 8.1 g/dL — AB (ref 13.0–17.0)
Hemoglobin: 7.1 g/dL — ABNORMAL LOW (ref 13.0–17.0)
MCH: 30.5 pg (ref 26.0–34.0)
MCH: 31.6 pg (ref 26.0–34.0)
MCHC: 36.5 g/dL — ABNORMAL HIGH (ref 30.0–36.0)
MCHC: 37 g/dL — ABNORMAL HIGH (ref 30.0–36.0)
MCV: 83.5 fL (ref 78.0–100.0)
MCV: 85.3 fL (ref 78.0–100.0)
PLATELETS: 6 10*3/uL — AB (ref 150–400)
Platelets: 54 K/uL — ABNORMAL LOW (ref 150–400)
RBC: 2.66 MIL/uL — AB (ref 4.22–5.81)
RBC: 2.25 MIL/uL — ABNORMAL LOW (ref 4.22–5.81)
RDW: 15.4 % (ref 11.5–15.5)
RDW: 16 % — ABNORMAL HIGH (ref 11.5–15.5)
WBC: 0.2 10*3/uL — CL (ref 4.0–10.5)
WBC: 0.2 K/uL — CL (ref 4.0–10.5)

## 2016-10-08 LAB — BASIC METABOLIC PANEL
ANION GAP: 13 (ref 5–15)
BUN: 23 mg/dL — ABNORMAL HIGH (ref 6–20)
CALCIUM: 8.5 mg/dL — AB (ref 8.9–10.3)
CO2: 14 mmol/L — ABNORMAL LOW (ref 22–32)
Chloride: 105 mmol/L (ref 101–111)
Creatinine, Ser: 1.3 mg/dL — ABNORMAL HIGH (ref 0.61–1.24)
GFR, EST AFRICAN AMERICAN: 58 mL/min — AB (ref >60)
GFR, EST NON AFRICAN AMERICAN: 50 mL/min — AB (ref >60)
GLUCOSE: 163 mg/dL — AB (ref 65–99)
Potassium: 3.9 mmol/L (ref 3.5–5.1)
SODIUM: 132 mmol/L — AB (ref 135–145)

## 2016-10-08 LAB — TYPE AND SCREEN
ABO/RH(D): O POS
ANTIBODY SCREEN: NEGATIVE

## 2016-10-08 MED ORDER — SODIUM CHLORIDE 0.9 % IV SOLN: 10.0000 mL/h | Freq: Once | INTRAVENOUS | Status: DC

## 2016-10-08 MED ORDER — OXYMETAZOLINE HCL 0.05 % NA SOLN
1.0000 | Freq: Once | NASAL | Status: AC
  Administered 2016-10-08: 1 via NASAL
  Filled 2016-10-08: qty 15

## 2016-10-08 MED ORDER — SODIUM CHLORIDE 0.9 % IV SOLN
10.0000 mL/h | Freq: Once | INTRAVENOUS | Status: AC
  Administered 2016-10-08: 10 mL/h via INTRAVENOUS

## 2016-10-08 NOTE — ED Provider Notes (Addendum)
[place] DEPT Provider Note   CSN: [id number] Arrival date & time: 10/08/16  8250     History   Chief Complaint Chief Complaint  Patient presents with  . Epistaxis    HPI [name] is a 82 y.o. male.  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 States that he was recently transfused packed red blood cells and platelets. He states that his nosebleed started this morning at 2 AM. He denies any traumatic injury. He reports bleeding from the right nostril.  He denies any other associated symptoms. There are no modifying factors. He takes a baby aspirin daily, but is not otherwise anticoagulated.   The history is provided by the patient. No language interpreter was used.    Past Medical History:  Diagnosis Date  . Arthritis   . Bladder neck contracture   . Cataract immature BILATERAL  . Coronary artery disease CARDIOLOGIST- DR Martinique-  LAST VISIT NOTE 12-20-2010 IN EPIC AND W/ CHART   subendocranial MI; left inter. mamm to LAD, saph vein to diag; saph vein to distal circ; saph vein to 1st obtuse  . Diabetes mellitus without complication ([place])   . DIVERTICULOSIS, COLON 09/05/2003   Qualifier: Diagnosis of  By: Nolon Rod CMA (AAMA), Robin    . EUSTACHIAN TUBE DYSFUNCTION, BILATERAL 06/22/2008   Qualifier: Diagnosis of  By: Linna Darner MD, William    . EXTERNAL HEMORRHOIDS 12/14/2007   Qualifier: Diagnosis of  By: Nolon Rod CMA (AAMA), Robin    . Frequency of urination   . GERD (gastroesophageal reflux disease)   . Glaucoma BILATERAL  . H/O hiatal hernia   . Hairy cell leukemia ([place])   . History of atherosclerotic cardiovascular disease   . History of kidney stones   . History of myocardial infarction 2006   S/P CABG  . History of prostate cancer S/P PROSTATECTOMY-  NO RECURRENCE   FOLLOWED BY DR Risa Grill  . Hyperlipidemia   . Hypertension   . Hypertriglyceridemia   .  Insomnia   . Macular degeneration   . Nocturia   . Raynaud's phenomenon   . Right arm cellulitis 04/02/12   post cat bite  . S/P CABG x 4 2006  . Stable angina (HCC)   . Urgency of urination     Patient Active Problem List   Diagnosis Date Noted  . Uncontrolled type 2 diabetes mellitus with hyperglycemia, without long-term current use of insulin ([place]) 07/31/2016  . Hollenhorst plaque, right eye 08/14/2015  . History of prostate cancer 05/15/2015  . Hyperglycemia 05/15/2015  . GERD (gastroesophageal reflux disease) 04/25/2015  . Muscle cramping 01/23/2015  . IBS (irritable bowel syndrome) 07/21/2012  . Hairy cell leukemia ([place]) 04/01/2012  . Pancytopenia ([place]) 03/25/2012  . Raynaud's phenomenon 02/10/2012  . Essential tremor 12/20/2010  . Coronary artery disease   . Hypertension   . Personal history of colonic polyps 12/15/2007  . Hypercholesterolemia 04/20/2006    Past Surgical History:  Procedure Laterality Date  . BALLOON DILATION  04/30/2011   Procedure: BALLOON DILATION;  Surgeon: Bernestine Amass, MD;  Location: [facility];  Service: Urology;  Laterality: N/A;  . CARDIAC CATHETERIZATION  07-30-2006  DR Martinique   POST CABG / OCCLUDED DIAGONAL &  FOURTH OBTUSE MARGINAL GRAFTS/ NORMAL LVF/ PATENT LIMA TO LAD & SEPHENOUS VEIN GRAFT TO 1ST OBTUSE MARGINAL   . CARDIAC CATHETERIZATION  04-23-2004   CRITICAL THREE-VESSEL CAD/ PRESERVED LVF  .  CORONARY ARTERY BYPASS GRAFT  04-25-2004  X4 VESSELS   left inter. mamm to LAD, saph vein to diag; saph vein to distal circ; saph vein to 1st obtuse  . CYSTOSCOPY  04/30/2011   Procedure: CYSTOSCOPY;  Surgeon: Bernestine Amass, MD;  Location: [facility];  Service: Urology;  Laterality: N/A;  30 MINUTES   . DEBRIDEMENT TENNIS ELBOW  2002   RIGHT; [facility]  . LAPAROSCOPIC CHOLECYSTECTOMY  02-05-2005  . ROBOT ASSISTED LAPAROSCOPIC RADICAL PROSTATECTOMY  05-11-2006       Home Medications    Prior to  Admission medications   Medication Sig Start Date End Date Taking? Authorizing Provider  aspirin 81 MG tablet Take 81 mg by mouth daily.    [provider]  glucose blood test strip Use to test blood sugar 5 times a week E11.65 09/16/16   Marin Olp, MD  isosorbide mononitrate (IMDUR) 60 MG 24 hr tablet Take 1 tablet (60 mg total) by mouth daily. 07/18/16   Martinique, Peter M, MD  Lancets [facility] ULTRASOFT) lancets Use to test blood sugar 5 times a week E11.65 09/16/16   Marin Olp, MD  latanoprost (XALATAN) 0.005 % ophthalmic solution Place 1 drop into both eyes at bedtime.     [provider]  losartan (COZAAR) 25 MG tablet Take 1 tablet (25 mg total) by mouth daily. 05/28/16   Martinique, Peter M, MD  LUTEIN PO Take 1 tablet by mouth daily.    [provider]  meclizine (ANTIVERT) 12.5 MG tablet Take 1 tablet (12.5 mg total) by mouth 3 (three) times daily as needed for dizziness. Patient not taking: Reported on 09/03/2016 08/18/16   Delano Metz, FNP  metFORMIN (GLUCOPHAGE) 500 MG tablet Take 1 tablet (500 mg total) by mouth 2 (two) times daily with a meal. 09/09/16   Marin Olp, MD  metoprolol succinate (TOPROL-XL) 50 MG 24 hr tablet Take 1 tablet (50 mg total) by mouth every evening. 01/11/15   Martinique, Peter M, MD  Multiple Vitamin (MULTIVITAMIN) tablet Take 1 tablet by mouth daily.    [provider]  nitroGLYCERIN (NITROSTAT) 0.4 MG SL tablet Place 1 tablet (0.4 mg total) under the tongue every 5 (five) minutes as needed for chest pain (x 3 doses max). 01/09/15 02/03/01  Martinique, Peter M, MD  omeprazole (PRILOSEC) 20 MG capsule TAKE 1 CAPSULE BY MOUTH IN THE EVENING 06/25/16   Martinique, Peter M, MD  simvastatin (ZOCOR) 20 MG tablet TAKE ONE TABLET BY MOUTH ONCE DAILY IN THE EVENING 01/03/16   Martinique, Peter M, MD    Family History Family History  Problem Relation Age of Onset  . Heart disease Sister        CABGX 1 sister and stents X1 sister  .  Cancer Sister   . Parkinsonism Father   . Diabetes Neg Hx   . Stroke Neg Hx     Social History Social History  Substance Use Topics  . Smoking status: Former Smoker    Years: 3.00    Types: Cigarettes, Cigars  . Smokeless tobacco: Never Used     Comment: [place] AND QUIT OCCASIONAL CIGAR  2007  . Alcohol use 8.4 oz/week    7 Glasses of wine, 7 Shots of liquor per week     Comment: cut back;      Allergies   Clindamycin   Review of Systems Review of Systems  All other systems reviewed and are negative.  Physical Exam Updated Vital Signs BP 125/67 (BP Location: Right Arm)   Pulse 83   Temp 99.2 F (37.3 C) (Oral)   Resp 18   SpO2 99%   Physical Exam  Constitutional: He is oriented to person, place, and time. He appears well-developed and well-nourished.  HENT:  Head: Normocephalic and atraumatic.  No longer bleeding, no retained blood clots, there is some dried blood near anterior septal wall which could have been the source  Eyes: Pupils are equal, round, and reactive to light. Conjunctivae and EOM are normal. Right eye exhibits no discharge. Left eye exhibits no discharge. No scleral icterus.  Neck: Normal range of motion. Neck supple. No JVD present.  Cardiovascular: Normal rate, regular rhythm and normal heart sounds.  Exam reveals no gallop and no friction rub.   No murmur heard. Pulmonary/Chest: Effort normal and breath sounds normal. No respiratory distress. He has no wheezes. He has no rales. He exhibits no tenderness.  Abdominal: Soft. He exhibits no distension and no mass. There is no tenderness. There is no rebound and no guarding.  Musculoskeletal: Normal range of motion. He exhibits no edema or tenderness.  Neurological: He is alert and oriented to person, place, and time.  Skin: Skin is warm and dry.  Psychiatric: He has a normal mood and affect. His behavior is normal. Judgment and thought content normal.  Nursing note and  vitals reviewed.    ED Treatments / Results  Labs (all labs ordered are listed, but only abnormal results are displayed) Labs Reviewed  CBC  BASIC METABOLIC PANEL  TYPE AND SCREEN    EKG  EKG Interpretation None       Radiology No results found.  Procedures Procedures (including critical care time) CRITICAL CARE Performed by: Montine Circle   Total critical care time: 38 minutes  Critical care time was exclusive of separately billable procedures and treating other patients.  Critical care was necessary to treat or prevent imminent or life-threatening deterioration.  Critical care was time spent personally by me on the following activities: development of treatment plan with patient and/or surrogate as well as nursing, discussions with consultants, evaluation of patient's response to treatment, examination of patient, obtaining history from patient or surrogate, ordering and performing treatments and interventions, ordering and review of laboratory studies, ordering and review of radiographic studies, pulse oximetry and re-evaluation of patient's condition.  Medications Ordered in ED Medications  oxymetazoline (AFRIN) 0.05 % nasal spray 1 spray (not administered)     Initial Impression / Assessment and Plan / ED Course  I have reviewed the triage vital signs and the nursing notes.  Pertinent labs & imaging results that were available during my care of the patient were reviewed by me and considered in my medical decision making (see chart for details).     Patient with epistaxis. Recently transfused for low platelets and low hemoglobin.    5:13 AM Bleeding is controlled with pressure.  Will monitor.   Labs are pending.  Platelets are 6. Given the patient's active bleeding tonight, will transfuse platelets. Hemoglobin is near patient's recent baseline. Patient placed on neutropenic precautions. He has no fevers, no cough, no infectious symptoms.  Patient will  require admission and monitoring.  Discussed with Dr. Stark Jock, who agrees with the plan.  Final Clinical Impressions(s) / ED Diagnoses   Final diagnoses:  Epistaxis  Pancytopenia ([place])    New Prescriptions New Prescriptions   No medications on file  Montine Circle, PA-C 10/08/16 [id number]    Veryl Speak, MD 10/08/16 [phone]

## 2016-10-08 NOTE — ED Provider Notes (Signed)
Assumed care of this patient this morning. Patient reportedly was thought to be admitted to the hospitalist service. However, I received a call from Dr. Tamala Julian notifying me that patient would need to be transferred to [facility]. I reviewed labs and imaging. Patient remains hemodynamically stable. He does follow-up with [facility] for his oncology care.  Discussed with Dr. Joan Mayans, [facility] oncology. Pt accepted to Leukemia service. Transfusing platelets, will admit.   Duffy Bruce, MD 10/08/16 [phone]

## 2016-10-08 NOTE — ED Notes (Signed)
Bed: [facility] Expected date:  Expected time:  Means of arrival:  Comments: 82 yr old nosebleed

## 2016-10-08 NOTE — Plan of Care (Addendum)
Received signout from Montine Circle PA-C  Mr. Subramaniam is a 82 year old male with past medical history of hairy cell leukemia followed at [facility] by Dr. Zenda Alpers; who presents with epistaxis since 2 am. WBC 0.2, platelets 6. Patient to be transfused 1 pack of platelets. No reported bleeding over the last 1 hour. No bed request placed and did not accept patient. Discussed with Dr. Duffy Bruce on the need of transfer to [facility] as all of his care and oncology services are there.

## 2016-10-08 NOTE — ED Notes (Signed)
Pt complains of a nosebleed for three hours Pt is from home and lives with his wife No hx of nosebleeds and he is a chemo pt

## 2016-10-09 DIAGNOSIS — R040 Epistaxis: Secondary | ICD-10-CM | POA: Diagnosis not present

## 2016-10-09 DIAGNOSIS — D61810 Antineoplastic chemotherapy induced pancytopenia: Secondary | ICD-10-CM | POA: Diagnosis not present

## 2016-10-09 DIAGNOSIS — Z5111 Encounter for antineoplastic chemotherapy: Secondary | ICD-10-CM | POA: Diagnosis not present

## 2016-10-09 DIAGNOSIS — C9142 Hairy cell leukemia, in relapse: Secondary | ICD-10-CM | POA: Diagnosis not present

## 2016-10-09 LAB — BPAM PLATELET PHERESIS
Blood Product Expiration Date: 201809282359
Blood Product Expiration Date: 201809271321
ISSUE DATE / TIME: 201809260905
Unit Type and Rh: 5100
Unit Type and Rh: 9500

## 2016-10-09 LAB — PREPARE PLATELET PHERESIS
UNIT DIVISION: 0
Unit division: 0

## 2016-10-10 DIAGNOSIS — C9142 Hairy cell leukemia, in relapse: Secondary | ICD-10-CM | POA: Diagnosis not present

## 2016-10-10 DIAGNOSIS — R040 Epistaxis: Secondary | ICD-10-CM | POA: Diagnosis not present

## 2016-10-10 DIAGNOSIS — D61810 Antineoplastic chemotherapy induced pancytopenia: Secondary | ICD-10-CM | POA: Diagnosis not present

## 2016-10-10 DIAGNOSIS — Z5111 Encounter for antineoplastic chemotherapy: Secondary | ICD-10-CM | POA: Diagnosis not present

## 2016-10-11 DIAGNOSIS — Z5111 Encounter for antineoplastic chemotherapy: Secondary | ICD-10-CM | POA: Diagnosis not present

## 2016-10-11 DIAGNOSIS — D61810 Antineoplastic chemotherapy induced pancytopenia: Secondary | ICD-10-CM | POA: Diagnosis not present

## 2016-10-11 DIAGNOSIS — R040 Epistaxis: Secondary | ICD-10-CM | POA: Diagnosis not present

## 2016-10-11 DIAGNOSIS — C9142 Hairy cell leukemia, in relapse: Secondary | ICD-10-CM | POA: Diagnosis not present

## 2016-10-12 DIAGNOSIS — C9142 Hairy cell leukemia, in relapse: Secondary | ICD-10-CM | POA: Diagnosis not present

## 2016-10-12 DIAGNOSIS — Z5111 Encounter for antineoplastic chemotherapy: Secondary | ICD-10-CM | POA: Diagnosis not present

## 2016-10-12 DIAGNOSIS — D61810 Antineoplastic chemotherapy induced pancytopenia: Secondary | ICD-10-CM | POA: Diagnosis not present

## 2016-10-12 DIAGNOSIS — R040 Epistaxis: Secondary | ICD-10-CM | POA: Diagnosis not present

## 2016-10-13 ENCOUNTER — Encounter (INDEPENDENT_AMBULATORY_CARE_PROVIDER_SITE_OTHER): Payer: Medicare Other | Admitting: Ophthalmology

## 2016-10-13 DIAGNOSIS — T887XXA Unspecified adverse effect of drug or medicament, initial encounter: Secondary | ICD-10-CM | POA: Diagnosis not present

## 2016-10-13 DIAGNOSIS — D61810 Antineoplastic chemotherapy induced pancytopenia: Secondary | ICD-10-CM | POA: Diagnosis not present

## 2016-10-13 DIAGNOSIS — C9142 Hairy cell leukemia, in relapse: Secondary | ICD-10-CM | POA: Diagnosis not present

## 2016-10-13 DIAGNOSIS — D500 Iron deficiency anemia secondary to blood loss (chronic): Secondary | ICD-10-CM | POA: Diagnosis not present

## 2016-10-14 DIAGNOSIS — T887XXA Unspecified adverse effect of drug or medicament, initial encounter: Secondary | ICD-10-CM | POA: Diagnosis not present

## 2016-10-14 DIAGNOSIS — D500 Iron deficiency anemia secondary to blood loss (chronic): Secondary | ICD-10-CM | POA: Diagnosis not present

## 2016-10-14 DIAGNOSIS — D61810 Antineoplastic chemotherapy induced pancytopenia: Secondary | ICD-10-CM | POA: Diagnosis not present

## 2016-10-14 DIAGNOSIS — M79601 Pain in right arm: Secondary | ICD-10-CM | POA: Diagnosis not present

## 2016-10-14 DIAGNOSIS — M7989 Other specified soft tissue disorders: Secondary | ICD-10-CM | POA: Diagnosis not present

## 2016-10-14 DIAGNOSIS — C9142 Hairy cell leukemia, in relapse: Secondary | ICD-10-CM | POA: Diagnosis not present

## 2016-10-15 DIAGNOSIS — C9142 Hairy cell leukemia, in relapse: Secondary | ICD-10-CM | POA: Diagnosis not present

## 2016-10-15 DIAGNOSIS — D500 Iron deficiency anemia secondary to blood loss (chronic): Secondary | ICD-10-CM | POA: Diagnosis not present

## 2016-10-15 DIAGNOSIS — D61810 Antineoplastic chemotherapy induced pancytopenia: Secondary | ICD-10-CM | POA: Diagnosis not present

## 2016-10-15 DIAGNOSIS — T887XXA Unspecified adverse effect of drug or medicament, initial encounter: Secondary | ICD-10-CM | POA: Diagnosis not present

## 2016-10-16 DIAGNOSIS — D500 Iron deficiency anemia secondary to blood loss (chronic): Secondary | ICD-10-CM | POA: Diagnosis not present

## 2016-10-16 DIAGNOSIS — T887XXA Unspecified adverse effect of drug or medicament, initial encounter: Secondary | ICD-10-CM | POA: Diagnosis not present

## 2016-10-16 DIAGNOSIS — C9142 Hairy cell leukemia, in relapse: Secondary | ICD-10-CM | POA: Diagnosis not present

## 2016-10-16 DIAGNOSIS — D61810 Antineoplastic chemotherapy induced pancytopenia: Secondary | ICD-10-CM | POA: Diagnosis not present

## 2016-10-17 DIAGNOSIS — D61810 Antineoplastic chemotherapy induced pancytopenia: Secondary | ICD-10-CM | POA: Diagnosis not present

## 2016-10-17 DIAGNOSIS — T887XXA Unspecified adverse effect of drug or medicament, initial encounter: Secondary | ICD-10-CM | POA: Diagnosis not present

## 2016-10-17 DIAGNOSIS — T827XXA Infection and inflammatory reaction due to other cardiac and vascular devices, implants and grafts, initial encounter: Secondary | ICD-10-CM | POA: Diagnosis not present

## 2016-10-17 DIAGNOSIS — D500 Iron deficiency anemia secondary to blood loss (chronic): Secondary | ICD-10-CM | POA: Diagnosis not present

## 2016-10-17 DIAGNOSIS — C9142 Hairy cell leukemia, in relapse: Secondary | ICD-10-CM | POA: Diagnosis not present

## 2016-10-17 DIAGNOSIS — R7881 Bacteremia: Secondary | ICD-10-CM | POA: Diagnosis not present

## 2016-10-18 DIAGNOSIS — K921 Melena: Secondary | ICD-10-CM | POA: Diagnosis not present

## 2016-10-18 DIAGNOSIS — D709 Neutropenia, unspecified: Secondary | ICD-10-CM | POA: Diagnosis not present

## 2016-10-18 DIAGNOSIS — R040 Epistaxis: Secondary | ICD-10-CM | POA: Diagnosis not present

## 2016-10-18 DIAGNOSIS — C9142 Hairy cell leukemia, in relapse: Secondary | ICD-10-CM | POA: Diagnosis not present

## 2016-10-21 ENCOUNTER — Other Ambulatory Visit: Payer: Self-pay | Admitting: Cardiology

## 2016-10-23 DIAGNOSIS — T827XXA Infection and inflammatory reaction due to other cardiac and vascular devices, implants and grafts, initial encounter: Secondary | ICD-10-CM | POA: Diagnosis not present

## 2016-10-23 DIAGNOSIS — C9142 Hairy cell leukemia, in relapse: Secondary | ICD-10-CM | POA: Diagnosis not present

## 2016-10-23 DIAGNOSIS — R7881 Bacteremia: Secondary | ICD-10-CM | POA: Diagnosis not present

## 2016-11-03 ENCOUNTER — Ambulatory Visit: Payer: Medicare Other | Admitting: Family Medicine

## 2016-11-04 ENCOUNTER — Other Ambulatory Visit: Payer: Self-pay | Admitting: Cardiology

## 2016-11-04 NOTE — Telephone Encounter (Signed)
REFILL 

## 2016-11-12 ENCOUNTER — Telehealth: Payer: Self-pay | Admitting: Family Medicine

## 2016-11-12 NOTE — Telephone Encounter (Signed)
Patient called advising that he has leukemia and diabetes and has been discharged from the hospital and needed to get in to see [name] Channel. I offered tomorrow afternoon for him to get in and the patient declined stating that he could not make it and demanded today.   I explained that [name] Channel is booked today and is only working until 12pm. Please call patient to discuss his concerns and discuss when [name] Channel could fit him in.

## 2016-11-12 NOTE — Telephone Encounter (Signed)
Called and spoke to patient and scheduled him for Friday, November 14, 2016. I did offer him to an appointment on Thursday but he refused as he is scheduled at [facility] already

## 2016-11-13 ENCOUNTER — Encounter (INDEPENDENT_AMBULATORY_CARE_PROVIDER_SITE_OTHER): Payer: Medicare Other | Admitting: Ophthalmology

## 2016-11-13 DIAGNOSIS — H353112 Nonexudative age-related macular degeneration, right eye, intermediate dry stage: Secondary | ICD-10-CM | POA: Diagnosis not present

## 2016-11-13 DIAGNOSIS — E113291 Type 2 diabetes mellitus with mild nonproliferative diabetic retinopathy without macular edema, right eye: Secondary | ICD-10-CM

## 2016-11-13 DIAGNOSIS — H35033 Hypertensive retinopathy, bilateral: Secondary | ICD-10-CM | POA: Diagnosis not present

## 2016-11-13 DIAGNOSIS — H43813 Vitreous degeneration, bilateral: Secondary | ICD-10-CM | POA: Diagnosis not present

## 2016-11-13 DIAGNOSIS — H353211 Exudative age-related macular degeneration, right eye, with active choroidal neovascularization: Secondary | ICD-10-CM | POA: Diagnosis not present

## 2016-11-13 DIAGNOSIS — E11319 Type 2 diabetes mellitus with unspecified diabetic retinopathy without macular edema: Secondary | ICD-10-CM | POA: Diagnosis not present

## 2016-11-13 DIAGNOSIS — I10 Essential (primary) hypertension: Secondary | ICD-10-CM | POA: Diagnosis not present

## 2016-11-14 ENCOUNTER — Encounter: Payer: Self-pay | Admitting: Family Medicine

## 2016-11-14 ENCOUNTER — Ambulatory Visit (INDEPENDENT_AMBULATORY_CARE_PROVIDER_SITE_OTHER): Payer: Medicare Other | Admitting: Family Medicine

## 2016-11-14 ENCOUNTER — Other Ambulatory Visit: Payer: Self-pay | Admitting: Cardiology

## 2016-11-14 VITALS — BP 126/66 | HR 81 | Temp 98.5°F | Ht 71.0 in | Wt 181.2 lb

## 2016-11-14 DIAGNOSIS — C9142 Hairy cell leukemia, in relapse: Secondary | ICD-10-CM

## 2016-11-14 DIAGNOSIS — E1165 Type 2 diabetes mellitus with hyperglycemia: Secondary | ICD-10-CM | POA: Diagnosis not present

## 2016-11-14 DIAGNOSIS — R21 Rash and other nonspecific skin eruption: Secondary | ICD-10-CM | POA: Diagnosis not present

## 2016-11-14 LAB — POCT GLYCOSYLATED HEMOGLOBIN (HGB A1C): HEMOGLOBIN A1C: 5.6

## 2016-11-14 MED ORDER — TRIAMCINOLONE ACETONIDE 0.1 % EX CREA: 1.0000 "application " | TOPICAL_CREAM | Freq: Two times a day (BID) | CUTANEOUS | 0 refills | Status: DC

## 2016-11-14 NOTE — Patient Instructions (Addendum)
Trial anti itch steroid cream for your scalp for 7-10 days. Use it twice a day. After that can use something like vaseline to keep it lubricated. Stay out of the sun. Could be related to zelboraf   If worsening redness or fever on scalp let us know- this should improve with time and staying out of sun  a1c down to 5.6.  7 is a reasonable goal  Lets have you take one tablet in the morning only and follow up in 14 weeks

## 2016-11-14 NOTE — Progress Notes (Signed)
Subjective:  [name] is a 82 y.o. year old very pleasant male patient who presents for/with See problem oriented charting ROS- denies hypoglycemia. Does have fatigue. Rash on scalp. No oral manifestations of rash. Does not feel ill overall other than fatigue   Past Medical History-  Patient Active Problem List   Diagnosis Date Noted  . Uncontrolled type 2 diabetes mellitus with hyperglycemia, without long-term current use of insulin ([place]) 07/31/2016    Priority: High  . Hairy cell leukemia ([place]) 04/01/2012    Priority: High  . Pancytopenia ([place]) 03/25/2012    Priority: High  . Coronary artery disease     Priority: High  . History of prostate cancer 05/15/2015    Priority: Medium  . Hyperglycemia 05/15/2015    Priority: Medium  . GERD (gastroesophageal reflux disease) 04/25/2015    Priority: Medium  . IBS (irritable bowel syndrome) 07/21/2012    Priority: Medium  . Hypertension     Priority: Medium  . Hypercholesterolemia 04/20/2006    Priority: Medium  . Muscle cramping 01/23/2015    Priority: Low  . Raynaud's phenomenon 02/10/2012    Priority: Low  . Essential tremor 12/20/2010    Priority: Low  . Personal history of colonic polyps 12/15/2007    Priority: Low  . Hollenhorst plaque, right eye 08/14/2015    Medications- reviewed and updated Current Outpatient Prescriptions  Medication Sig Dispense Refill  . acetaminophen (TYLENOL) 500 MG tablet Take 500 mg by mouth every 6 (six) hours as needed for moderate pain.    . cyclobenzaprine (FLEXERIL) 10 MG tablet Take 10 mg by mouth 3 (three) times daily as needed for muscle spasms.     [name] glucose blood test strip Use to test blood sugar 5 times a week E11.65 100 each 12  . isosorbide mononitrate (IMDUR) 60 MG 24 hr tablet Take 1 tablet (60 mg total) by mouth daily. 90 tablet 3  . Lancets (ONETOUCH ULTRASOFT) lancets Use to test blood sugar 5 times a week E11.65 100 each 4  . latanoprost (XALATAN) 0.005 % ophthalmic  solution Place 1 drop into both eyes at bedtime.     [name] losartan (COZAAR) 25 MG tablet TAKE 1 TABLET BY MOUTH ONCE DAILY 90 tablet 1  . LUTEIN PO Take 1 tablet by mouth daily.    . meclizine (ANTIVERT) 12.5 MG tablet Take 1 tablet (12.5 mg total) by mouth 3 (three) times daily as needed for dizziness. 30 tablet 0  . metFORMIN (GLUCOPHAGE) 500 MG tablet Take 1 tablet (500 mg total) by mouth 2 (two) times daily with a meal. 180 tablet 3  . metoprolol succinate (TOPROL-XL) 50 MG 24 hr tablet Take 1 tablet (50 mg total) by mouth every evening. 90 tablet 2  . Multiple Vitamin (MULTIVITAMIN) tablet Take 1 tablet by mouth daily.    . nitroGLYCERIN (NITROSTAT) 0.4 MG SL tablet Place 1 tablet (0.4 mg total) under the tongue every 5 (five) minutes as needed for chest pain (x 3 doses max). 25 tablet 3  . omeprazole (PRILOSEC) 20 MG capsule TAKE 1 CAPSULE BY MOUTH IN THE EVENING 90 capsule 0  . ondansetron (ZOFRAN-ODT) 4 MG disintegrating tablet Take 4-8 mg by mouth every 8 (eight) hours as needed for nausea or vomiting.     . simvastatin (ZOCOR) 20 MG tablet TAKE ONE TABLET BY MOUTH IN THE EVENING 90 tablet 3  . vemurafenib (ZELBORAF) 240 MG tablet Take 4 tablets by mouth 2 (two) times daily.  No current facility-administered medications for this visit.     Objective: BP 126/66 (BP Location: Left Arm, Patient Position: Sitting, Cuff Size: Large)   Pulse 81   Temp 98.5 F (36.9 C) (Oral)   Ht 5\' 11"  (1.803 m)   Wt 181 lb 3.2 oz (82.2 kg)   SpO2 96%   BMI 25.27 kg/m  Gen: NAD, resting comfortably, has lost considerable weight compared to last visit CV: RRR stable murmur noted Lungs: CTAB no crackles, wheeze, rhonchi Abdomen: soft/nontender/nondistended/normal bowel sounds. Ext: no edema Skin: warm, dry, erythematous on top of scalp and just above right forehead- some scale in several areas (possible AKs) Neuro: normal gait  Assessment/Plan:  Hairy cell leukemia (HCC) RASH S: hospitalized  for 3 weeks and lost over 30 lbs- states mostly muscle mass. Working on his building his stamina back up. Worsening of hairy cell leukemia- 3rd recurrence of this- managed by Dr. Linus Orn at [facility]  3 blood infections per him- starting with nosebleeds. So many injections in right arm that arm became infected. Had picc and used bulb for antibiotics.   He is on chemotherapy and was told to stay out of sun- left moonroof uncovered and has had some scalp itching/irritation. Not worsening. No treatments tried. Did try to get into dermatology but would take months.  A/P: for hairy cell-I am having trouble accessing care everywhere but per patient he has improved and remains on oral chemotherapy for continued treatment. Seems rash on scalp coul be from sun exposure while on is Zelboraf- we will trial triamcinolone to see if that helps with itch at least. Return precautions discussed   Uncontrolled type 2 diabetes mellitus with hyperglycemia, without long-term current use of insulin (HCC) S: drastic weight loss but has regained 10 lbs. He is on metformin 500mg  BID.Sugars at home ranging from 97-180 and honestly probably 150 or less on average spot checks.  A/P: Likely target a1c 8 or less- dont want to increase metformin as can affect appetite. Today, came back at 5.6- potentially affected by chemotherapy and hairy cell- we agreed to reduce metformin to 500mg  daily and follow up in 14 weeks. May spike back up but will readjust as needed.    Future Appointments Date Time Provider [place]  12/11/2016 8:30 AM Hayden Pedro, MD [facility] None  02/20/2017 11:00 AM Marin Olp, MD [facility] None  09/04/2017 3:00 PM Williemae Area, RN [facility] None   Return in about 14 weeks (around 02/20/2017).  Orders Placed This Encounter  Procedures  . POCT glycosylated hemoglobin (Hb A1C)   Meds ordered this encounter  Medications  . vemurafenib (ZELBORAF) 240 MG tablet    Sig: Take 4 tablets by  mouth 2 (two) times daily.  [name] triamcinolone cream (KENALOG) 0.1 %    Sig: Apply 1 application topically 2 (two) times daily. For 7-10 days maximum    Dispense:  80 g    Refill:  0    Return precautions advised.  Garret Reddish, MD

## 2016-11-15 NOTE — Assessment & Plan Note (Signed)
S: hospitalized for 3 weeks and lost over 30 lbs- states mostly muscle mass. Working on his building his stamina back up. Worsening of hairy cell leukemia- 3rd recurrence of this- managed by Dr. Linus Orn at [facility]  3 blood infections per him- starting with nosebleeds. So many injections in right arm that arm became infected. Had picc and used bulb for antibiotics. He is on chemotherapy and was told to stay out of sun- left moonroof uncovered and has had some scalp itching/irritation A/P: I am having trouble accessing care everywhere but per patient he has improved and remains on oral chemotherapy for continued treatment. Seems rash on scalp coul be from sun exposure while on is Zelboraf- we will trial triamcinolone to see if that helps with itch at least

## 2016-11-15 NOTE — Assessment & Plan Note (Signed)
S: drastic weight loss but has regained 10 lbs. He is on metformin 500mg  BID.Sugars at home ranging from 97-180 and honestly probably 150 or less on average spot checks.  A/P: Likely target a1c 8 or less- dont want to increase metformin as can affect appetite. Today, came back at 5.6- potentially affected by chemotherapy and hairy cell- we agreed to reduce metformin to 500mg  daily and follow up in 14 weeks. May spike back up but will readjust as needed.

## 2016-11-20 DIAGNOSIS — C9142 Hairy cell leukemia, in relapse: Secondary | ICD-10-CM | POA: Diagnosis not present

## 2016-11-20 DIAGNOSIS — D61818 Other pancytopenia: Secondary | ICD-10-CM | POA: Diagnosis not present

## 2016-11-20 DIAGNOSIS — Z951 Presence of aortocoronary bypass graft: Secondary | ICD-10-CM | POA: Diagnosis not present

## 2016-11-20 DIAGNOSIS — H3530 Unspecified macular degeneration: Secondary | ICD-10-CM | POA: Diagnosis not present

## 2016-11-20 DIAGNOSIS — Z5111 Encounter for antineoplastic chemotherapy: Secondary | ICD-10-CM | POA: Diagnosis not present

## 2016-12-11 ENCOUNTER — Encounter (INDEPENDENT_AMBULATORY_CARE_PROVIDER_SITE_OTHER): Payer: Medicare Other | Admitting: Ophthalmology

## 2016-12-11 DIAGNOSIS — I10 Essential (primary) hypertension: Secondary | ICD-10-CM | POA: Diagnosis not present

## 2016-12-11 DIAGNOSIS — H43813 Vitreous degeneration, bilateral: Secondary | ICD-10-CM

## 2016-12-11 DIAGNOSIS — H353122 Nonexudative age-related macular degeneration, left eye, intermediate dry stage: Secondary | ICD-10-CM | POA: Diagnosis not present

## 2016-12-11 DIAGNOSIS — H353211 Exudative age-related macular degeneration, right eye, with active choroidal neovascularization: Secondary | ICD-10-CM | POA: Diagnosis not present

## 2016-12-11 DIAGNOSIS — H35033 Hypertensive retinopathy, bilateral: Secondary | ICD-10-CM

## 2016-12-13 ENCOUNTER — Other Ambulatory Visit: Payer: Self-pay | Admitting: Cardiology

## 2016-12-13 DIAGNOSIS — I10 Essential (primary) hypertension: Secondary | ICD-10-CM

## 2016-12-18 DIAGNOSIS — C9142 Hairy cell leukemia, in relapse: Secondary | ICD-10-CM | POA: Diagnosis not present

## 2016-12-18 DIAGNOSIS — L570 Actinic keratosis: Secondary | ICD-10-CM | POA: Diagnosis not present

## 2016-12-18 DIAGNOSIS — L989 Disorder of the skin and subcutaneous tissue, unspecified: Secondary | ICD-10-CM | POA: Diagnosis not present

## 2016-12-18 DIAGNOSIS — I2510 Atherosclerotic heart disease of native coronary artery without angina pectoris: Secondary | ICD-10-CM | POA: Diagnosis not present

## 2016-12-19 DIAGNOSIS — E1165 Type 2 diabetes mellitus with hyperglycemia: Secondary | ICD-10-CM | POA: Diagnosis not present

## 2017-01-01 DIAGNOSIS — C9142 Hairy cell leukemia, in relapse: Secondary | ICD-10-CM | POA: Diagnosis not present

## 2017-01-15 ENCOUNTER — Encounter (INDEPENDENT_AMBULATORY_CARE_PROVIDER_SITE_OTHER): Payer: Medicare Other | Admitting: Ophthalmology

## 2017-01-15 DIAGNOSIS — H35033 Hypertensive retinopathy, bilateral: Secondary | ICD-10-CM | POA: Diagnosis not present

## 2017-01-15 DIAGNOSIS — I10 Essential (primary) hypertension: Secondary | ICD-10-CM

## 2017-01-15 DIAGNOSIS — H353211 Exudative age-related macular degeneration, right eye, with active choroidal neovascularization: Secondary | ICD-10-CM | POA: Diagnosis not present

## 2017-01-15 DIAGNOSIS — H43813 Vitreous degeneration, bilateral: Secondary | ICD-10-CM

## 2017-01-15 DIAGNOSIS — H353122 Nonexudative age-related macular degeneration, left eye, intermediate dry stage: Secondary | ICD-10-CM | POA: Diagnosis not present

## 2017-01-21 DIAGNOSIS — H401111 Primary open-angle glaucoma, right eye, mild stage: Secondary | ICD-10-CM | POA: Diagnosis not present

## 2017-01-21 DIAGNOSIS — E119 Type 2 diabetes mellitus without complications: Secondary | ICD-10-CM | POA: Diagnosis not present

## 2017-01-21 DIAGNOSIS — H353212 Exudative age-related macular degeneration, right eye, with inactive choroidal neovascularization: Secondary | ICD-10-CM | POA: Diagnosis not present

## 2017-01-21 DIAGNOSIS — L723 Sebaceous cyst: Secondary | ICD-10-CM | POA: Diagnosis not present

## 2017-01-26 ENCOUNTER — Other Ambulatory Visit: Payer: Self-pay | Admitting: Cardiology

## 2017-02-11 ENCOUNTER — Encounter (INDEPENDENT_AMBULATORY_CARE_PROVIDER_SITE_OTHER): Payer: Medicare Other | Admitting: Ophthalmology

## 2017-02-11 DIAGNOSIS — H353211 Exudative age-related macular degeneration, right eye, with active choroidal neovascularization: Secondary | ICD-10-CM | POA: Diagnosis not present

## 2017-02-11 DIAGNOSIS — H35033 Hypertensive retinopathy, bilateral: Secondary | ICD-10-CM | POA: Diagnosis not present

## 2017-02-11 DIAGNOSIS — I10 Essential (primary) hypertension: Secondary | ICD-10-CM | POA: Diagnosis not present

## 2017-02-11 DIAGNOSIS — H43813 Vitreous degeneration, bilateral: Secondary | ICD-10-CM

## 2017-02-11 DIAGNOSIS — H353122 Nonexudative age-related macular degeneration, left eye, intermediate dry stage: Secondary | ICD-10-CM

## 2017-02-11 LAB — HM DIABETES EYE EXAM

## 2017-02-12 DIAGNOSIS — C9141 Hairy cell leukemia, in remission: Secondary | ICD-10-CM | POA: Diagnosis not present

## 2017-02-20 ENCOUNTER — Ambulatory Visit: Payer: Medicare Other | Admitting: Family Medicine

## 2017-02-20 ENCOUNTER — Encounter: Payer: Self-pay | Admitting: Family Medicine

## 2017-02-20 VITALS — BP 120/68 | HR 58 | Temp 97.6°F | Ht 71.0 in | Wt 195.8 lb

## 2017-02-20 DIAGNOSIS — E7800 Pure hypercholesterolemia, unspecified: Secondary | ICD-10-CM | POA: Diagnosis not present

## 2017-02-20 DIAGNOSIS — K219 Gastro-esophageal reflux disease without esophagitis: Secondary | ICD-10-CM

## 2017-02-20 DIAGNOSIS — I10 Essential (primary) hypertension: Secondary | ICD-10-CM

## 2017-02-20 DIAGNOSIS — E1165 Type 2 diabetes mellitus with hyperglycemia: Secondary | ICD-10-CM

## 2017-02-20 DIAGNOSIS — C9141 Hairy cell leukemia, in remission: Secondary | ICD-10-CM | POA: Diagnosis not present

## 2017-02-20 DIAGNOSIS — L570 Actinic keratosis: Secondary | ICD-10-CM

## 2017-02-20 LAB — POCT GLYCOSYLATED HEMOGLOBIN (HGB A1C): Hemoglobin A1C: 6.2

## 2017-02-20 MED ORDER — OMEPRAZOLE 40 MG PO CPDR: DELAYED_RELEASE_CAPSULE | ORAL | 3 refills | Status: DC

## 2017-02-20 NOTE — Progress Notes (Signed)
Subjective:  [name] is a 83 y.o. year old very pleasant male patient who presents for/with See problem oriented charting ROS- No exertional chest pain or shortness of breath. No headache or blurry vision.  Does have scaly areas on scalp  Past Medical History-  Patient Active Problem List   Diagnosis Date Noted  . Uncontrolled type 2 diabetes mellitus with hyperglycemia, without long-term current use of insulin ([name]) 07/31/2016    Priority: High  . Hairy cell leukemia ([name]) 04/01/2012    Priority: High  . Pancytopenia ([name]) 03/25/2012    Priority: High  . Coronary artery disease     Priority: High  . History of prostate cancer 05/15/2015    Priority: Medium  . Hyperglycemia 05/15/2015    Priority: Medium  . GERD (gastroesophageal reflux disease) 04/25/2015    Priority: Medium  . IBS (irritable bowel syndrome) 07/21/2012    Priority: Medium  . Hypertension     Priority: Medium  . Hypercholesterolemia 04/20/2006    Priority: Medium  . Muscle cramping 01/23/2015    Priority: Low  . Raynaud's phenomenon 02/10/2012    Priority: Low  . Essential tremor 12/20/2010    Priority: Low  . Personal history of colonic polyps 12/15/2007    Priority: Low  . Actinic keratosis 02/21/2017  . 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 glucose blood test strip Use to test blood sugar 5 times a week E11.65 100 each 12  . isosorbide mononitrate (IMDUR) 60 MG 24 hr tablet Take 1 tablet (60 mg total) by mouth daily. 90 tablet 3  . Lancets (ONETOUCH ULTRASOFT) lancets Use to test blood sugar 5 times a week E11.65 100 each 4  . latanoprost (XALATAN) 0.005 % ophthalmic solution Place 1 drop into both eyes at bedtime.     [name] losartan (COZAAR) 25 MG tablet TAKE 1 TABLET BY MOUTH ONCE DAILY 90 tablet 1  . LUTEIN PO Take 1 tablet by mouth daily.    . metFORMIN (GLUCOPHAGE) 500 MG tablet Take 1  tablet (500 mg total) by mouth 2 (two) times daily with a meal. 180 tablet 3  . metoprolol succinate (TOPROL-XL) 50 MG 24 hr tablet TAKE 1 TABLET BY MOUTH ONCE DAILY IN THE EVENING. PLEASE MAKE APPOINTMENT FOR REFILLS. 90 tablet 1  . Multiple Vitamin (MULTIVITAMIN) tablet Take 1 tablet by mouth daily.    . nitroGLYCERIN (NITROSTAT) 0.4 MG SL tablet Place 1 tablet (0.4 mg total) under the tongue every 5 (five) minutes as needed for chest pain (x 3 doses max). 25 tablet 3  . omeprazole (PRILOSEC) 40 MG capsule TAKE 1 CAPSULE BY MOUTH ONCE DAILY IN THE EVENING 30 capsule 3  . simvastatin (ZOCOR) 20 MG tablet TAKE ONE TABLET BY MOUTH IN THE EVENING 90 tablet 3   No current facility-administered medications for this visit.     Objective: BP 120/68 (BP Location: Left Arm, Patient Position: Sitting, Cuff Size: Large)   Pulse (!) 58   Temp 97.6 F (36.4 C) (Oral)   Ht 5\' 11"  (1.803 m)   Wt 195 lb 12.8 oz (88.8 kg)   SpO2 94%   BMI 27.31 kg/m  Gen: NAD, resting comfortably CV: RRR, posisbly very faint murmur aortic area- he will ask cardiologist next visit. No rubs or gallops Lungs: CTAB no crackles, wheeze, rhonchi Abdomen: soft/nontender/nondistended/normal bowel sounds. overweight Ext: no edema Skin: warm, dry, 5 scaly lesions with  erythematous base on posterior scalp  Assessment/Plan:  Uncontrolled type 2 diabetes mellitus with hyperglycemia, without long-term current use of insulin ([name]) S: hopefully controlled. On metformin 500mg  BID. targetting a1c 8 or less. Holding off on going up on metformin to avoid decreasing his appetite. We actually went down to 500mg  and advised 14 week follow up which is today (wonder if hair cell leukemia and chemo affecting his labs) CBGs- 100- 180 fasting blood sugars with some #s outside of this though rate. Probably averaging around 160.  Lab Results  Component Value Date   HGBA1C 5.6 11/14/2016   HGBA1C 8.8 (H) 07/17/2016   HGBA1C 5.9 (H) 05/26/2012    A/P: update a1c today. May go back up to BID metformin  Hypertension S: controlled on imdur 60mg , losartan 25mg , metoprolol 50mg  XR BP Readings from Last 3 Encounters:  02/20/17 120/68  11/14/16 126/66  10/08/16 122/74  A/P: We discussed blood pressure goal of <140/90. Continue current meds  Hypercholesterolemia S: reasonably controlled on simvastatin 20mg . No myalgias.  Lab Results  Component Value Date   CHOL 128 07/17/2016   HDL 33 (L) 07/17/2016   LDLCALC 35 07/17/2016   TRIG 299 (H) 07/17/2016   CHOLHDL 3.9 07/17/2016   A/P: at goal with LDL <70- continue meds  Hairy cell leukemia (HCC) S:  on oral chemotherhapy for hairy cell leukemia relapse- was on zelboraf but he is now off. With Dr. Linus Orn at [facility]. Is losing some hair- potentially side effect of medicine/prior treatment  Rash on scalp while on zelboraf- we gave triamcinolone. Did not clear- underlying this looks like actinic keratosis (see that section)  A/P: Patient will continue follow up with [name]- most recent notes suggestive of remission again which Is reassuring  Actinic keratosis S: patient with 5 scaly lesions on back of scalp. Did not improve with triamcinolone- I had mentioned to him more likely AKs than rash related to zelboraf at last visit but he wanted to try cream- did not help.  A/P: aftercare handout given. Cryotherapy x5 completed today.   GERD (gastroesophageal reflux disease) S: intermittent pain on right side of chest- wore with coffee, chocolate. Actually sometimes better with meals.  A/P: no exertional symptoms to suggest cardiac. we opted to try omeprazole 40mg  over 20mg  for next 1-2 months- if doesn't improve should let us know   Future Appointments  Date Time Provider [place]  03/11/2017  8:30 AM Hayden Pedro, MD [facility] None  07/20/2017 10:00 AM Marin Olp, MD [facility]  09/04/2017  3:00 PM Williemae Area, RN [facility]   Lab/Order  associations: Uncontrolled type 2 diabetes mellitus with hyperglycemia, without long-term current use of insulin ([name]) - Plan: POCT glycosylated hemoglobin (Hb A1C)  Meds ordered this encounter  Medications  . omeprazole (PRILOSEC) 40 MG capsule    Sig: TAKE 1 CAPSULE BY MOUTH ONCE DAILY IN THE EVENING    Dispense:  30 capsule    Refill:  3    Return precautions advised.  Garret Reddish, MD

## 2017-02-20 NOTE — Assessment & Plan Note (Signed)
S: controlled on imdur 60mg , losartan 25mg , metoprolol 50mg  XR BP Readings from Last 3 Encounters:  02/20/17 120/68  11/14/16 126/66  10/08/16 122/74  A/P: We discussed blood pressure goal of <140/90. Continue current meds

## 2017-02-20 NOTE — Patient Instructions (Addendum)
we opted to try omeprazole 40mg  over 20mg  for next 1-2 months- if doesn't improve should let us know. If symptoms worsen please see [place] sooner  a1c looks great at 6.2. Continue current metformin  Cryotherapy on scalp on 5 separate areas

## 2017-02-20 NOTE — Assessment & Plan Note (Signed)
S: hopefully controlled. On metformin 500mg  BID. targetting a1c 8 or less. Holding off on going up on metformin to avoid decreasing his appetite. We actually went down to 500mg  and advised 14 week follow up which is today (wonder if hair cell leukemia and chemo affecting his labs) CBGs- 100- 180 fasting blood sugars with some #s outside of this though rate. Probably averaging around 160.  Lab Results  Component Value Date   HGBA1C 5.6 11/14/2016   HGBA1C 8.8 (H) 07/17/2016   HGBA1C 5.9 (H) 05/26/2012   A/P: update a1c today. May go back up to BID metformin

## 2017-02-20 NOTE — Assessment & Plan Note (Signed)
S: reasonably controlled on simvastatin 20mg . No myalgias.  Lab Results  Component Value Date   CHOL 128 07/17/2016   HDL 33 (L) 07/17/2016   LDLCALC 35 07/17/2016   TRIG 299 (H) 07/17/2016   CHOLHDL 3.9 07/17/2016   A/P: at goal with LDL <70- continue meds

## 2017-02-21 DIAGNOSIS — L570 Actinic keratosis: Secondary | ICD-10-CM | POA: Insufficient documentation

## 2017-02-21 NOTE — Assessment & Plan Note (Signed)
S:  on oral chemotherhapy for hairy cell leukemia relapse- was on zelboraf but he is now off. With Dr. Linus Orn at [facility]. Is losing some hair- potentially side effect of medicine/prior treatment  Rash on scalp while on zelboraf- we gave triamcinolone. Did not clear- underlying this looks like actinic keratosis (see that section)  A/P: Patient will continue follow up with [facility]- most recent notes suggestive of remission again which Is reassuring

## 2017-02-21 NOTE — Assessment & Plan Note (Signed)
S: patient with 5 scaly lesions on back of scalp. Did not improve with triamcinolone- I had mentioned to him more likely AKs than rash related to zelboraf at last visit but he wanted to try cream- did not help.  A/P: aftercare handout given. Cryotherapy x5 completed today.

## 2017-02-21 NOTE — Assessment & Plan Note (Addendum)
S: intermittent pain on right side of chest- wore with coffee, chocolate. Actually sometimes better with meals.  A/P: no exertional symptoms to suggest cardiac. we opted to try omeprazole 40mg  over 20mg  for next 1-2 months- if doesn't improve should let us know

## 2017-03-11 ENCOUNTER — Encounter (INDEPENDENT_AMBULATORY_CARE_PROVIDER_SITE_OTHER): Payer: Medicare Other | Admitting: Ophthalmology

## 2017-03-11 DIAGNOSIS — H353122 Nonexudative age-related macular degeneration, left eye, intermediate dry stage: Secondary | ICD-10-CM

## 2017-03-11 DIAGNOSIS — H35033 Hypertensive retinopathy, bilateral: Secondary | ICD-10-CM

## 2017-03-11 DIAGNOSIS — H353211 Exudative age-related macular degeneration, right eye, with active choroidal neovascularization: Secondary | ICD-10-CM

## 2017-03-11 DIAGNOSIS — I10 Essential (primary) hypertension: Secondary | ICD-10-CM | POA: Diagnosis not present

## 2017-03-11 DIAGNOSIS — H43813 Vitreous degeneration, bilateral: Secondary | ICD-10-CM | POA: Diagnosis not present

## 2017-04-08 ENCOUNTER — Encounter (INDEPENDENT_AMBULATORY_CARE_PROVIDER_SITE_OTHER): Payer: Medicare Other | Admitting: Ophthalmology

## 2017-04-08 DIAGNOSIS — H35033 Hypertensive retinopathy, bilateral: Secondary | ICD-10-CM | POA: Diagnosis not present

## 2017-04-08 DIAGNOSIS — H353211 Exudative age-related macular degeneration, right eye, with active choroidal neovascularization: Secondary | ICD-10-CM | POA: Diagnosis not present

## 2017-04-08 DIAGNOSIS — I10 Essential (primary) hypertension: Secondary | ICD-10-CM

## 2017-04-08 DIAGNOSIS — H353122 Nonexudative age-related macular degeneration, left eye, intermediate dry stage: Secondary | ICD-10-CM | POA: Diagnosis not present

## 2017-04-08 DIAGNOSIS — H43813 Vitreous degeneration, bilateral: Secondary | ICD-10-CM | POA: Diagnosis not present

## 2017-04-16 ENCOUNTER — Ambulatory Visit: Payer: Medicare Other | Admitting: Family Medicine

## 2017-04-24 DIAGNOSIS — C61 Malignant neoplasm of prostate: Secondary | ICD-10-CM | POA: Diagnosis not present

## 2017-04-24 LAB — PSA: PSA: <0.015

## 2017-04-30 DIAGNOSIS — N393 Stress incontinence (female) (male): Secondary | ICD-10-CM | POA: Diagnosis not present

## 2017-04-30 DIAGNOSIS — R350 Frequency of micturition: Secondary | ICD-10-CM | POA: Diagnosis not present

## 2017-04-30 DIAGNOSIS — Z8546 Personal history of malignant neoplasm of prostate: Secondary | ICD-10-CM | POA: Diagnosis not present

## 2017-05-05 ENCOUNTER — Encounter: Payer: Self-pay | Admitting: Family Medicine

## 2017-05-13 ENCOUNTER — Encounter (INDEPENDENT_AMBULATORY_CARE_PROVIDER_SITE_OTHER): Payer: Medicare Other | Admitting: Ophthalmology

## 2017-05-13 DIAGNOSIS — H43813 Vitreous degeneration, bilateral: Secondary | ICD-10-CM

## 2017-05-13 DIAGNOSIS — I10 Essential (primary) hypertension: Secondary | ICD-10-CM

## 2017-05-13 DIAGNOSIS — H353211 Exudative age-related macular degeneration, right eye, with active choroidal neovascularization: Secondary | ICD-10-CM

## 2017-05-13 DIAGNOSIS — H35033 Hypertensive retinopathy, bilateral: Secondary | ICD-10-CM

## 2017-05-13 DIAGNOSIS — H353122 Nonexudative age-related macular degeneration, left eye, intermediate dry stage: Secondary | ICD-10-CM

## 2017-05-14 DIAGNOSIS — C9141 Hairy cell leukemia, in remission: Secondary | ICD-10-CM | POA: Diagnosis not present

## 2017-05-14 LAB — HEMOGLOBIN A1C: Hemoglobin A1C: 6.8

## 2017-05-16 DIAGNOSIS — E1165 Type 2 diabetes mellitus with hyperglycemia: Secondary | ICD-10-CM | POA: Diagnosis not present

## 2017-06-11 ENCOUNTER — Encounter: Payer: Self-pay | Admitting: Family Medicine

## 2017-06-11 ENCOUNTER — Ambulatory Visit: Payer: Medicare Other | Admitting: Family Medicine

## 2017-06-11 VITALS — BP 132/84 | HR 70 | Temp 98.1°F | Ht 71.0 in | Wt 203.6 lb

## 2017-06-11 DIAGNOSIS — D61818 Other pancytopenia: Secondary | ICD-10-CM

## 2017-06-11 DIAGNOSIS — E1165 Type 2 diabetes mellitus with hyperglycemia: Secondary | ICD-10-CM | POA: Diagnosis not present

## 2017-06-11 DIAGNOSIS — J069 Acute upper respiratory infection, unspecified: Secondary | ICD-10-CM

## 2017-06-11 DIAGNOSIS — C9141 Hairy cell leukemia, in remission: Secondary | ICD-10-CM

## 2017-06-11 MED ORDER — GUAIFENESIN-CODEINE 100-10 MG/5ML PO SOLN: 5.0000 mL | Freq: Four times a day (QID) | ORAL | 0 refills | Status: DC | PRN

## 2017-06-11 MED ORDER — METFORMIN HCL 500 MG PO TABS
500.0000 mg | ORAL_TABLET | Freq: Two times a day (BID) | ORAL | 3 refills | Status: DC
Start: 2017-06-11 — End: 2018-07-22

## 2017-06-11 MED ORDER — METFORMIN HCL 500 MG PO TABS: 500.0000 mg | ORAL_TABLET | Freq: Every day | ORAL | 3 refills | Status: DC

## 2017-06-11 MED ORDER — BLOOD GLUCOSE METER KIT: PACK | 0 refills | Status: AC

## 2017-06-11 MED ORDER — PREDNISONE 20 MG PO TABS: ORAL_TABLET | ORAL | 0 refills | Status: DC

## 2017-06-11 NOTE — Patient Instructions (Addendum)
Health Maintenance Due  Topic Date Due  . FOOT EXAM - next visit 04/05/1944   Schedule a visit so we can evaluate the spot in the beard as well as the ear. Try to trim beard down a bit so we can get better view.   Lets try cough syrup and prednisone like we did last year which seemed to clear things up.   If you are not starting to improve by next week or symptoms worsen or dont resolve within another 2 weeks- I will call you in an antibiotic like azithromycin. If you were to have worsening shortness of breath- would want to see you back immediatley

## 2017-06-11 NOTE — Assessment & Plan Note (Signed)
S: reasonably controlled on metformin 500mg  daily. A1c slipping up though Lab Results  Component Value Date   HGBA1C 6.8 05/14/2017   HGBA1C 6.2 02/20/2017   HGBA1C 5.6 11/14/2016   A/P: will increase metformin back to BID- prefer to keep him under 6.5 if possible. He admits walking is down due to orthopedic issues- if that improves may drop down on metformin again in future

## 2017-06-11 NOTE — Progress Notes (Signed)
PCP: Marin Olp, MD  Subjective:  [name] is a 83 y.o. year old very pleasant male patient who presents with Upper Respiratory infection symptoms including nasal congestion, cough for 10 days though 2-3 days ago worsened.   He reports Coughing fits with clear phlegm. Most commonly early in Am but can get these fits throughout the day. He has some body aches, chest congestion. No sinus pain. Does have some sneezing.  No fever. Some chills and sweating. Mildest of  SOB, wheeze at time. Worse with in and out of [facility] -previous treatments: Zyrtec, nose spray -Hx of: allergies  ROS-denies fever,  NVD, tooth pain  Pertinent Past Medical History-  Patient Active Problem List   Diagnosis Date Noted  . Uncontrolled type 2 diabetes mellitus with hyperglycemia, without long-term current use of insulin ([place]) 07/31/2016    Priority: High  . Hairy cell leukemia ([place]) 04/01/2012    Priority: High  . Pancytopenia ([place]) 03/25/2012    Priority: High  . Coronary artery disease     Priority: High  . History of prostate cancer 05/15/2015    Priority: Medium  . Hyperglycemia 05/15/2015    Priority: Medium  . GERD (gastroesophageal reflux disease) 04/25/2015    Priority: Medium  . IBS (irritable bowel syndrome) 07/21/2012    Priority: Medium  . Hypertension     Priority: Medium  . Hypercholesterolemia 04/20/2006    Priority: Medium  . Muscle cramping 01/23/2015    Priority: Low  . Raynaud's phenomenon 02/10/2012    Priority: Low  . Essential tremor 12/20/2010    Priority: Low  . Personal history of colonic polyps 12/15/2007    Priority: Low  . Actinic keratosis 02/21/2017  . Hollenhorst plaque, right eye 08/14/2015   Medications- reviewed  Current Outpatient Medications  Medication Sig Dispense Refill  . blood glucose meter kit and supplies Use to check blood sugar 5 times a week. Dx Code E11.65. 1 each 0  . glucose blood test strip Use to test blood sugar 5 times a week  E11.65 100 each 12  . guaiFENesin-codeine 100-10 MG/5ML syrup Take 5 mLs by mouth every 6 (six) hours as needed for cough (do not drive for 8 hours after taking). 120 mL 0  . isosorbide mononitrate (IMDUR) 60 MG 24 hr tablet Take 1 tablet (60 mg total) by mouth daily. 90 tablet 3  . Lancets (ONETOUCH ULTRASOFT) lancets Use to test blood sugar 5 times a week E11.65 100 each 4  . latanoprost (XALATAN) 0.005 % ophthalmic solution Place 1 drop into both eyes at bedtime.     [name] losartan (COZAAR) 25 MG tablet TAKE 1 TABLET BY MOUTH ONCE DAILY 90 tablet 1  . LUTEIN PO Take 1 tablet by mouth daily.    . metFORMIN (GLUCOPHAGE) 500 MG tablet Take 1 tablet (500 mg total) by mouth 2 (two) times daily with a meal. 180 tablet 3  . metoprolol succinate (TOPROL-XL) 50 MG 24 hr tablet TAKE 1 TABLET BY MOUTH ONCE DAILY IN THE EVENING. PLEASE MAKE APPOINTMENT FOR REFILLS. 90 tablet 1  . Multiple Vitamin (MULTIVITAMIN) tablet Take 1 tablet by mouth daily.    . nitroGLYCERIN (NITROSTAT) 0.4 MG SL tablet Place 1 tablet (0.4 mg total) under the tongue every 5 (five) minutes as needed for chest pain (x 3 doses max). 25 tablet 3  . omeprazole (PRILOSEC) 40 MG capsule TAKE 1 CAPSULE BY MOUTH ONCE DAILY IN THE EVENING 30 capsule 3  . predniSONE (DELTASONE)  20 MG tablet Take 1 tablet by mouth daily for 5 days, then 1/2 tablet daily for 2 days 6 tablet 0  . simvastatin (ZOCOR) 20 MG tablet TAKE ONE TABLET BY MOUTH IN THE EVENING 90 tablet 3   No current facility-administered medications for this visit.     Objective: BP 132/84 (BP Location: Left Arm, Patient Position: Sitting, Cuff Size: Normal)   Pulse 70   Temp 98.1 F (36.7 C) (Oral)   Ht _0  (1.803 m)   Wt 203 lb 9.6 oz (92.4 kg)   SpO2 96%   BMI 28.40 kg/m  Gen: NAD, resting comfortably HEENT: Turbinates erythematous with clear drainage, TM normal, pharynx mildly erythematous with no tonsilar exudate or edema, no sinus tenderness CV: RRR stable murmur-no  rubs or gallops Lungs: CTAB no crackles, wheeze, rhonchi.  Ext: no edema Skin: warm, dry, no rash Neuro: grossly normal, moves all extremities  Assessment/Plan:  Upper Respiratory infection History and exam today are suggestive of viral infection most likely due to upper respiratory infection. Symptomatic treatment with: Prednisone and codeine cough syrup.  There may be an allergic element.  Patient had very similar symptoms last year in April and responded well to this regimen.  Due to the fact that he is at day 10 and his worsened in the last 2 or 3 days I did discuss with him trialing azithromycin if he is not improving by next week.  He is high risk for bacterial infection and consequences of this due to hairy cell leukemia, diabetes, age.  Finally, we reviewed reasons to return to care including if symptoms worsen or persist or new concerns arise- once again particularly shortness of breath or fever.  Meds ordered this encounter  Medications  . DISCONTD: metFORMIN (GLUCOPHAGE) 500 MG tablet    Sig: Take 1 tablet (500 mg total) by mouth daily.    Dispense:  90 tablet    Refill:  3  . metFORMIN (GLUCOPHAGE) 500 MG tablet    Sig: Take 1 tablet (500 mg total) by mouth 2 (two) times daily with a meal.    Dispense:  180 tablet    Refill:  3  . guaiFENesin-codeine 100-10 MG/5ML syrup    Sig: Take 5 mLs by mouth every 6 (six) hours as needed for cough (do not drive for 8 hours after taking).    Dispense:  120 mL    Refill:  0  . predniSONE (DELTASONE) 20 MG tablet    Sig: Take 1 tablet by mouth daily for 5 days, then 1/2 tablet daily for 2 days    Dispense:  6 tablet    Refill:  0  . blood glucose meter kit and supplies    Sig: Use to check blood sugar 5 times a week. Dx Code E11.65.    Dispense:  1 each    Refill:  0    One Touch Verio Flex    Order Specific Question:   Number of strips    Answer:   100    Order Specific Question:   Number of lancets    Answer:   100   Garret Reddish, MD

## 2017-06-17 ENCOUNTER — Encounter (INDEPENDENT_AMBULATORY_CARE_PROVIDER_SITE_OTHER): Payer: Medicare Other | Admitting: Ophthalmology

## 2017-06-17 DIAGNOSIS — I10 Essential (primary) hypertension: Secondary | ICD-10-CM

## 2017-06-17 DIAGNOSIS — H353211 Exudative age-related macular degeneration, right eye, with active choroidal neovascularization: Secondary | ICD-10-CM

## 2017-06-17 DIAGNOSIS — H353122 Nonexudative age-related macular degeneration, left eye, intermediate dry stage: Secondary | ICD-10-CM | POA: Diagnosis not present

## 2017-06-17 DIAGNOSIS — H35033 Hypertensive retinopathy, bilateral: Secondary | ICD-10-CM | POA: Diagnosis not present

## 2017-06-17 DIAGNOSIS — E1165 Type 2 diabetes mellitus with hyperglycemia: Secondary | ICD-10-CM | POA: Diagnosis not present

## 2017-06-17 DIAGNOSIS — H43813 Vitreous degeneration, bilateral: Secondary | ICD-10-CM

## 2017-06-23 ENCOUNTER — Other Ambulatory Visit: Payer: Self-pay | Admitting: Cardiology

## 2017-07-07 ENCOUNTER — Other Ambulatory Visit (HOSPITAL_COMMUNITY): Payer: Self-pay | Admitting: Physician Assistant

## 2017-07-07 ENCOUNTER — Ambulatory Visit (HOSPITAL_COMMUNITY)
Admission: RE | Admit: 2017-07-07 | Discharge: 2017-07-07 | Disposition: A | Discharge status: Home / Self Care | Payer: Medicare Other | Source: Ambulatory Visit | Attending: Cardiovascular Disease | Admitting: Cardiovascular Disease

## 2017-07-07 DIAGNOSIS — M79605 Pain in left leg: Secondary | ICD-10-CM | POA: Diagnosis not present

## 2017-07-07 DIAGNOSIS — M1711 Unilateral primary osteoarthritis, right knee: Secondary | ICD-10-CM | POA: Insufficient documentation

## 2017-07-07 DIAGNOSIS — M79604 Pain in right leg: Secondary | ICD-10-CM | POA: Diagnosis not present

## 2017-07-07 DIAGNOSIS — R52 Pain, unspecified: Secondary | ICD-10-CM

## 2017-07-07 DIAGNOSIS — M79661 Pain in right lower leg: Secondary | ICD-10-CM | POA: Diagnosis not present

## 2017-07-13 ENCOUNTER — Other Ambulatory Visit: Payer: Self-pay | Admitting: Cardiology

## 2017-07-13 DIAGNOSIS — I10 Essential (primary) hypertension: Secondary | ICD-10-CM

## 2017-07-13 NOTE — Telephone Encounter (Signed)
Rx(s) sent to pharmacy electronically.  

## 2017-07-15 ENCOUNTER — Encounter (INDEPENDENT_AMBULATORY_CARE_PROVIDER_SITE_OTHER): Payer: Medicare Other | Admitting: Ophthalmology

## 2017-07-15 DIAGNOSIS — H43813 Vitreous degeneration, bilateral: Secondary | ICD-10-CM | POA: Diagnosis not present

## 2017-07-15 DIAGNOSIS — H353211 Exudative age-related macular degeneration, right eye, with active choroidal neovascularization: Secondary | ICD-10-CM | POA: Diagnosis not present

## 2017-07-15 DIAGNOSIS — H35033 Hypertensive retinopathy, bilateral: Secondary | ICD-10-CM

## 2017-07-15 DIAGNOSIS — I10 Essential (primary) hypertension: Secondary | ICD-10-CM

## 2017-07-15 DIAGNOSIS — H353122 Nonexudative age-related macular degeneration, left eye, intermediate dry stage: Secondary | ICD-10-CM

## 2017-07-20 ENCOUNTER — Ambulatory Visit: Payer: Medicare Other | Admitting: Family Medicine

## 2017-07-20 ENCOUNTER — Encounter: Payer: Self-pay | Admitting: Family Medicine

## 2017-07-20 VITALS — BP 120/64 | HR 91 | Temp 98.3°F | Ht 71.0 in | Wt 199.6 lb

## 2017-07-20 DIAGNOSIS — J069 Acute upper respiratory infection, unspecified: Secondary | ICD-10-CM | POA: Diagnosis not present

## 2017-07-20 DIAGNOSIS — E119 Type 2 diabetes mellitus without complications: Secondary | ICD-10-CM

## 2017-07-20 DIAGNOSIS — I10 Essential (primary) hypertension: Secondary | ICD-10-CM | POA: Diagnosis not present

## 2017-07-20 DIAGNOSIS — E7800 Pure hypercholesterolemia, unspecified: Secondary | ICD-10-CM

## 2017-07-20 DIAGNOSIS — L570 Actinic keratosis: Secondary | ICD-10-CM

## 2017-07-20 MED ORDER — IPRATROPIUM BROMIDE 0.06 % NA SOLN: 2.0000 | Freq: Four times a day (QID) | NASAL | 0 refills | Status: DC

## 2017-07-20 MED ORDER — BENZONATATE 100 MG PO CAPS: 100.0000 mg | ORAL_CAPSULE | Freq: Three times a day (TID) | ORAL | 0 refills | Status: AC | PRN

## 2017-07-20 NOTE — Assessment & Plan Note (Signed)
S: well controlled on metformin 500mg  BID with goal a1c at least 8 or less. 30 day average of 132 in AM Lab Results  Component Value Date   HGBA1C 6.8 05/14/2017   HGBA1C 6.2 02/20/2017   HGBA1C 5.6 11/14/2016    A/P: had a1c early in may so too early to repeat today. Will update foot exam though. Likely update a1c next visit. Continue current dose of metformin at 500mg  BID

## 2017-07-20 NOTE — Assessment & Plan Note (Signed)
S: last visit in February we treated 5 AKs. Has a slightly raised lesion on left ear with erythematous base and scale A/P: we did a 7 second freeze thaw cycle today on left ear. I asked Mr. Luty to let me know if this doesn't go away within a month- if it doesn't I want to refer him to dermatology

## 2017-07-20 NOTE — Progress Notes (Signed)
Subjective:  [name] is a 83 y.o. year old very pleasant male patient who presents for/with See problem oriented charting ROS- no fever or chills. Has cough and congestion. No shortness of breath. No hypoglycemia.    Past Medical History-  Patient Active Problem List   Diagnosis Date Noted  . Uncontrolled type 2 diabetes mellitus with hyperglycemia, without long-term current use of insulin ([place]) 07/31/2016    Priority: High  . Hairy cell leukemia ([place]) 04/01/2012    Priority: High  . Pancytopenia ([place]) 03/25/2012    Priority: High  . Coronary artery disease     Priority: High  . History of prostate cancer 05/15/2015    Priority: Medium  . Hyperglycemia 05/15/2015    Priority: Medium  . GERD (gastroesophageal reflux disease) 04/25/2015    Priority: Medium  . IBS (irritable bowel syndrome) 07/21/2012    Priority: Medium  . Hypertension     Priority: Medium  . Hypercholesterolemia 04/20/2006    Priority: Medium  . Muscle cramping 01/23/2015    Priority: Low  . Raynaud's phenomenon 02/10/2012    Priority: Low  . Essential tremor 12/20/2010    Priority: Low  . Personal history of colonic polyps 12/15/2007    Priority: Low  . Actinic keratosis 02/21/2017  . 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 blood glucose meter kit and supplies Use to check blood sugar 5 times a week. Dx Code E11.65. 1 each 0  . glucose blood test strip Use to test blood sugar 5 times a week E11.65 100 each 12  . guaiFENesin-codeine 100-10 MG/5ML syrup Take 5 mLs by mouth every 6 (six) hours as needed for cough (do not drive for 8 hours after taking). 120 mL 0  . isosorbide mononitrate (IMDUR) 60 MG 24 hr tablet Take 1 tablet (60 mg total) by mouth daily. 90 tablet 3  . Lancets (ONETOUCH ULTRASOFT) lancets Use to test blood sugar 5 times a week E11.65 100 each 4  . latanoprost (XALATAN) 0.005  % ophthalmic solution Place 1 drop into both eyes at bedtime.     [name] losartan (COZAAR) 25 MG tablet TAKE 1 TABLET BY MOUTH ONCE DAILY 90 tablet 1  . LUTEIN PO Take 1 tablet by mouth daily.    . metFORMIN (GLUCOPHAGE) 500 MG tablet Take 1 tablet (500 mg total) by mouth 2 (two) times daily with a meal. 180 tablet 3  . metoprolol succinate (TOPROL-XL) 50 MG 24 hr tablet Take 1 tablet (50 mg total) by mouth daily. NEEDS APPOINTMENT FOR FUTURE REFILLS 30 tablet 0  . Multiple Vitamin (MULTIVITAMIN) tablet Take 1 tablet by mouth daily.    . nitroGLYCERIN (NITROSTAT) 0.4 MG SL tablet Place 1 tablet (0.4 mg total) under the tongue every 5 (five) minutes as needed for chest pain (x 3 doses max). 25 tablet 3  . omeprazole (PRILOSEC) 40 MG capsule TAKE 1 CAPSULE BY MOUTH ONCE DAILY IN THE EVENING 30 capsule 3  . predniSONE (DELTASONE) 20 MG tablet Take 1 tablet by mouth daily for 5 days, then 1/2 tablet daily for 2 days 6 tablet 0  . simvastatin (ZOCOR) 20 MG tablet TAKE ONE TABLET BY MOUTH IN THE EVENING 90 tablet 3   No current facility-administered medications for this visit.     Objective: BP 120/64 (BP Location: Left Arm, Patient Position: Sitting, Cuff Size: Normal)   Pulse 91   Temp 98.3  F (36.8 C) (Oral)   Ht 5' 11" (1.803 m)   Wt 199 lb 9.6 oz (90.5 kg)   SpO2 96%   BMI 27.84 kg/m  Gen: NAD, resting comfortably Mild erythema in pharynx. Rhinorrhea noted past erythematous nares. Normal TM CV: RRR systolic ejection murmur 3/6. no rubs or gallops Lungs: CTAB no crackles, wheeze, rhonchi Abdomen: soft/nontender/nondistended/normal bowel sounds.  Ext: no edema Skin: warm, dry, on left ear there is a slightly raised erythematous lesion with scaling top about 5 x 3 mm.   Diabetic Foot Exam - Simple   Simple Foot Form Diabetic Foot exam was performed with the following findings:  Yes 07/20/2017 10:55 AM  Visual Inspection No deformities, no ulcerations, no other skin breakdown bilaterally:   Yes See comments:  Yes Sensation Testing Intact to touch and monofilament testing bilaterally:  Yes Pulse Check Posterior Tibialis and Dorsalis pulse intact bilaterally:  Yes Comments Onychomycosis noted    Procedure note: Benefits and risks verbally discussed with patient 7 second freeze thaw cycle of cryotherapy performed  with liquid nitrogen to left ear actinic keratosis No complications.  Patient tolerated the procedure well other than mild pain. Gave handout on this from [facility] and we reviewed this content thoroughly [email or web address]  Assessment/Plan: Other notes: 1.small skin tag on right race- treated with 7 second cycle of cryotherapy as well  2. Slight murmur today- dont see any mention with cardiology Dr. Martinique- will ask patient to ask about this specifically at next visit in October.   S: Patient with cough and congestion that Worsened on Saturday. A lot of post nasal drip, mucus in throat- gets up at night to clear this out. Feels run down. Has had some body aches with it. Feels like flu without fever. No tick bites that he is aware of. Has some sore throat. Doesn't spend a lot of time outside but does have a garden he works in some.  A/P:Steroid shot one week ago- hold off on prednisone plus has glaucoma. From avs "For your cold/upper respiratory infection. Try tessalon perles for cough. May also use ipratropium nasal spray for runny nose if ok with your eye doctor (please callt hem to double check- I know you have glaucoma but it has been well controlled so may be ok with them). Update me on Thursday/Friday. With your hairy cell leukemia history I would be more likely to send something in for you at about a week of symptoms. " would probably use azithromycin   Hypertension S: controlled on imdur 23m, losartan 233m metoprolol 5039mR A/P: We discussed blood pressure goal of <140/90. Continue current  meds  Hypercholesterolemia S: reasonably controlled on simvastatin 81m22mth LDL 35 below goal of 70 though triglycerides were nearly 300 A/P: update lipid panel at next visit   Uncontrolled type 2 diabetes mellitus with hyperglycemia, without long-term current use of insulin (HCC) S: well controlled on metformin 500mg72m with goal a1c at least 8 or less. 30 day average of 132 in AM Lab Results  Component Value Date   HGBA1C 6.8 05/14/2017   HGBA1C 6.2 02/20/2017   HGBA1C 5.6 11/14/2016    A/P: had a1c early in may so too early to repeat today. Will update foot exam though. Likely update a1c next visit. Continue current dose of metformin at 500mg 64m Actinic keratosis S: last visit in February we treated 5 AKs. Has a slightly raised lesion on left ear with erythematous base and scale A/P: we did a  7 second freeze thaw cycle today on left ear. I asked Mr. Radloff to let me know if this doesn't go away within a month- if it doesn't I want to refer him to dermatology   Future Appointments  Date Time Provider [place]  08/18/2017  7:45 AM Hayden Pedro, MD [facility] None  09/04/2017  3:00 PM [facility]  10/14/2017  9:40 AM Martinique, Peter M, MD [facility]   No follow-ups on file.  Lab/Order associations: Controlled type 2 diabetes mellitus without complication, without long-term current use of insulin (HCC)  Hypercholesterolemia  Essential hypertension  Actinic keratosis  Acute URI  Meds ordered this encounter  Medications  . benzonatate (TESSALON) 100 MG capsule    Sig: Take 1 capsule (100 mg total) by mouth 3 (three) times daily as needed for up to 20 days for cough.    Dispense:  30 capsule    Refill:  0  . ipratropium (ATROVENT) 0.06 % nasal spray    Sig: Place 2 sprays into both nostrils 4 (four) times daily.    Dispense:  15 mL    Refill:  0   Return precautions advised.  Garret Reddish, MD

## 2017-07-20 NOTE — Patient Instructions (Addendum)
Health Maintenance Due  Topic Date Due  . FOOT EXAM - Today at Office Visit 04/05/1944   Please check with your pharmacy to see if they have the shingrix vaccine. If they do- please get this immunization and update [place] by phone call or mychart with dates you receive the vaccine  we did a 7 second freeze thaw cycle today on left ear. I asked [name] to let me know if this doesn't go away within a month- if it doesn't I want to refer him to dermatology  For your cold/upper respiratory infection. Try tessalon perles for cough. May also use ipratropium nasal spray for runny nose if ok with your eye doctor (please callt hem to double check- I know you have glaucoma but it has been well controlled so may be ok with them). Update me on Thursday/Friday. With your hairy cell leukemia history I would be more likely to send something in for you at about a week of symptoms.   Slight murmur today- dont see any mention with cardiology Dr. Martinique- will ask patient to ask about this specifically at next visit in October.

## 2017-07-20 NOTE — Assessment & Plan Note (Signed)
S: reasonably controlled on simvastatin 20mg  with LDL 35 below goal of 70 though triglycerides were nearly 300 A/P: update lipid panel at next visit

## 2017-07-23 ENCOUNTER — Encounter: Payer: Self-pay | Admitting: Family Medicine

## 2017-07-23 ENCOUNTER — Telehealth: Payer: Self-pay | Admitting: Family Medicine

## 2017-07-23 MED ORDER — AZITHROMYCIN 250 MG PO TABS: ORAL_TABLET | ORAL | 0 refills | Status: DC

## 2017-07-23 NOTE — Telephone Encounter (Signed)
Copied from [place] [id number]. Topic: Quick Communication - See Telephone Encounter >> Jul 23, 2017 11:00 AM Rutherford Nail, NT wrote: CRM for notification. See Telephone encounter for: 07/23/17. Patient calling and states that Dr Yong Channel wanted him to call back if he was not feeling any better. Patient states that he feels he is getting worse. Per patient the medication that Dr Yong Channel gave him doesn't seen like it's doing the job. CB#: [place], [place]

## 2017-07-23 NOTE — Telephone Encounter (Signed)
Thanks for the update.  I sent in azithromycin for him.  Please have him update [place] if not improving by next week.  See [place] if symptoms are worsening or seek care over the weekend-we do have some Saturday clinic hours

## 2017-07-23 NOTE — Telephone Encounter (Signed)
Dr Yong Channel, please advise.  Per office visit note on 07/20/17: S: Patient with cough and congestion that Worsened on Saturday. A lot of post nasal drip, mucus in throat- gets up at night to clear this out. Feels run down. Has had some body aches with it. Feels like flu without fever. No tick bites that he is aware of. Has some sore throat. Doesn't spend a lot of time outside but does have a garden he works in some.  A/P:Steroid shot one week ago- hold off on prednisone plus has glaucoma. From avs "For your cold/upper respiratory infection. Try tessalon perles for cough. May also use ipratropium nasal spray for runny nose if ok with your eye doctor (please callt hem to double check- I know you have glaucoma but it has been well controlled so may be ok with them). Update me on Thursday/Friday. With your hairy cell leukemia history I would be more likely to send something in for you at about a week of symptoms. " would probably use azithromycin

## 2017-07-23 NOTE — Telephone Encounter (Signed)
See note

## 2017-07-23 NOTE — Telephone Encounter (Signed)
Please advise 

## 2017-07-24 MED ORDER — GUAIFENESIN-CODEINE 100-10 MG/5ML PO SOLN: 5.0000 mL | Freq: Four times a day (QID) | ORAL | 0 refills | Status: DC | PRN

## 2017-07-24 NOTE — Telephone Encounter (Signed)
Sent in

## 2017-07-24 NOTE — Telephone Encounter (Signed)
Please see message and send in cough syrup.

## 2017-07-24 NOTE — Telephone Encounter (Signed)
Spoke to pt told him [name] Channel will send in cough syrup will be at the end of the day. Told pt if not better by Monday please call for an appt. Pt verbalized understanding.

## 2017-07-24 NOTE — Telephone Encounter (Signed)
Spoke to pt told him [name] Channel sent Rx to pharmacy for Azithromycin and update [place] if not improving by next week. If symptoms worsen see [place] or see care over the weekend Saturday clinic. Pt verbalized understanding and said he did pick up antibiotic and still feeling about the same except cough is worse. Pt said using Tessalon capsules but not helping much and voice is hoarse. Told pt he can try OTC Delsym for cough see if that helps or I can ask [name] Channel to send something in for you. Pt said he would like something sent in. Told pt I will discuss with [name] Channel and get back to him. Pt verbalized understanding.

## 2017-07-24 NOTE — Addendum Note (Signed)
Addended by: Marin Olp on: 07/24/2017 06:35 PM   Modules accepted: Orders

## 2017-07-31 DIAGNOSIS — H401111 Primary open-angle glaucoma, right eye, mild stage: Secondary | ICD-10-CM | POA: Diagnosis not present

## 2017-07-31 DIAGNOSIS — H353212 Exudative age-related macular degeneration, right eye, with inactive choroidal neovascularization: Secondary | ICD-10-CM | POA: Diagnosis not present

## 2017-08-13 ENCOUNTER — Other Ambulatory Visit: Payer: Self-pay | Admitting: Cardiology

## 2017-08-13 DIAGNOSIS — C9140 Hairy cell leukemia not having achieved remission: Secondary | ICD-10-CM | POA: Diagnosis not present

## 2017-08-13 DIAGNOSIS — I10 Essential (primary) hypertension: Secondary | ICD-10-CM

## 2017-08-18 ENCOUNTER — Encounter (INDEPENDENT_AMBULATORY_CARE_PROVIDER_SITE_OTHER): Payer: Medicare Other | Admitting: Ophthalmology

## 2017-08-18 DIAGNOSIS — H353122 Nonexudative age-related macular degeneration, left eye, intermediate dry stage: Secondary | ICD-10-CM | POA: Diagnosis not present

## 2017-08-18 DIAGNOSIS — H35033 Hypertensive retinopathy, bilateral: Secondary | ICD-10-CM

## 2017-08-18 DIAGNOSIS — H43813 Vitreous degeneration, bilateral: Secondary | ICD-10-CM

## 2017-08-18 DIAGNOSIS — H353211 Exudative age-related macular degeneration, right eye, with active choroidal neovascularization: Secondary | ICD-10-CM

## 2017-08-18 DIAGNOSIS — I10 Essential (primary) hypertension: Secondary | ICD-10-CM

## 2017-08-19 ENCOUNTER — Other Ambulatory Visit: Payer: Self-pay | Admitting: Family Medicine

## 2017-08-24 ENCOUNTER — Other Ambulatory Visit: Payer: Self-pay | Admitting: Cardiology

## 2017-09-04 ENCOUNTER — Ambulatory Visit: Payer: Medicare Other

## 2017-09-16 NOTE — Progress Notes (Signed)
Subjective:   [name] is a 83 y.o. male who presents for Medicare Annual/Subsequent preventive examination.  Reports health as fair  Not in the hospital   Diet Chol/hdl 3.9 A1c 6.8  Exercise Will increase exercise   ETOH 14 per week Agrees he gets up to go to the bathroom often during the night  Quit smoking in [place] Maintenance Due  Topic Date Due  . INFLUENZA VACCINE  08/13/2017   PSA 04/2017 Diabetic eye exam 01/2017       Objective:    Vitals: BP 110/60   Pulse 74   Ht '5\' 11"'  (1.803 m)   Wt 203 lb (92.1 kg)   SpO2 95%   BMI 28.31 kg/m   Body mass index is 28.31 kg/m.  Advanced Directives 09/17/2017 09/17/2017 09/03/2016 09/02/2016 03/25/2012 04/28/2011  Does Patient Have a Medical Advance Directive? No Yes No No Patient does not have advance directive;Patient would not like information Patient would like information  Would patient like information on creating a medical advance directive? - - - No - Patient declined - -   Agrees to teak  Tobacco Social History   Tobacco Use  Smoking Status Former Smoker  . Years: 3.00  . Types: Cigarettes, Cigars  Smokeless Tobacco Never Used  Tobacco Comment   QUIT SMOKING CIGARETTES 1964 AND QUIT OCCASIONAL CIGAR  2007     Counseling given: Yes Comment: [place] AND QUIT OCCASIONAL CIGAR  2007   Clinical Intake:     Past Medical History:  Diagnosis Date  . Arthritis   . Bladder neck contracture   . Cataract immature BILATERAL  . Coronary artery disease CARDIOLOGIST- DR Martinique-  LAST VISIT NOTE 12-20-2010 IN EPIC AND W/ CHART   subendocranial MI; left inter. mamm to LAD, saph vein to diag; saph vein to distal circ; saph vein to 1st obtuse  . Diabetes mellitus without complication ([place])   . DIVERTICULOSIS, COLON 09/05/2003   Qualifier: Diagnosis of  By: Nolon Rod CMA (AAMA), Robin    . EUSTACHIAN TUBE DYSFUNCTION, BILATERAL 06/22/2008   Qualifier: Diagnosis of  By: Linna Darner MD,  William    . EXTERNAL HEMORRHOIDS 12/14/2007   Qualifier: Diagnosis of  By: Nolon Rod CMA (AAMA), Robin    . Frequency of urination   . GERD (gastroesophageal reflux disease)   . Glaucoma BILATERAL  . H/O hiatal hernia   . Hairy cell leukemia ([place])   . History of atherosclerotic cardiovascular disease   . History of kidney stones   . History of myocardial infarction 2006   S/P CABG  . History of prostate cancer S/P PROSTATECTOMY-  NO RECURRENCE   FOLLOWED BY DR Risa Grill  . Hyperlipidemia   . Hypertension   . Hypertriglyceridemia   . Insomnia   . Macular degeneration   . Nocturia   . Raynaud's phenomenon   . Right arm cellulitis 04/02/12   post cat bite  . S/P CABG x 4 2006  . Stable angina (HCC)   . Urgency of urination    Past Surgical History:  Procedure Laterality Date  . BALLOON DILATION  04/30/2011   Procedure: BALLOON DILATION;  Surgeon: Bernestine Amass, MD;  Location: [facility];  Service: Urology;  Laterality: N/A;  . CARDIAC CATHETERIZATION  07-30-2006  DR Martinique   POST CABG / OCCLUDED DIAGONAL &  FOURTH OBTUSE MARGINAL GRAFTS/ NORMAL LVF/ PATENT [place] TO LAD & SEPHENOUS VEIN GRAFT TO 1ST OBTUSE MARGINAL   .  CARDIAC CATHETERIZATION  04-23-2004   CRITICAL THREE-VESSEL CAD/ PRESERVED LVF  . CORONARY ARTERY BYPASS GRAFT  04-25-2004  X4 VESSELS   left inter. mamm to LAD, saph vein to diag; saph vein to distal circ; saph vein to 1st obtuse  . CYSTOSCOPY  04/30/2011   Procedure: CYSTOSCOPY;  Surgeon: Bernestine Amass, MD;  Location: [facility];  Service: Urology;  Laterality: N/A;  30 MINUTES   . DEBRIDEMENT TENNIS ELBOW  2002   RIGHT; [facility]  . LAPAROSCOPIC CHOLECYSTECTOMY  02-05-2005  . ROBOT ASSISTED LAPAROSCOPIC RADICAL PROSTATECTOMY  05-11-2006   Family History  Problem Relation Age of Onset  . Heart disease Sister        CABGX 1 sister and stents X1 sister  . Cancer Sister   . Parkinsonism Father   . Diabetes Neg Hx   . Stroke Neg  Hx    Social History   Socioeconomic History  . Marital status: Married    Spouse name: Not on file  . Number of children: 2  . Years of education: Not on file  . Highest education level: Not on file  Occupational History  . Occupation: MANUFACTURER'S REP    Employer: [employer]  . Financial resource strain: Not on file  . Food insecurity:    Worry: Not on file    Inability: Not on file  . Transportation needs:    Medical: Not on file    Non-medical: Not on file  Tobacco Use  . Smoking status: Former Smoker    Years: 3.00    Types: Cigarettes, Cigars  . Smokeless tobacco: Never Used  . Tobacco comment: [place] AND QUIT OCCASIONAL CIGAR  2007  Substance and Sexual Activity  . Alcohol use: Yes    Alcohol/week: 14.0 standard drinks    Types: 7 Glasses of wine, 7 Shots of liquor per week    Comment: has dinner drink shot and 2 glasses of wine   . Drug use: No  . Sexual activity: Not on file  Lifestyle  . Physical activity:    Days per week: Not on file    Minutes per session: Not on file  . Stress: Not on file  Relationships  . Social connections:    Talks on phone: Not on file    Gets together: Not on file    Attends religious service: Not on file    Active member of club or organization: Not on file    Attends meetings of clubs or organizations: Not on file    Relationship status: Not on file  Other Topics Concern  . Not on file  Social History Narrative  . Not on file    Outpatient Encounter Medications as of 09/17/2017  Medication Sig  . blood glucose meter kit and supplies Use to check blood sugar 5 times a week. Dx Code E11.65.  [name] glucose blood test strip Use to test blood sugar 5 times a week E11.65  . isosorbide mononitrate (IMDUR) 60 MG 24 hr tablet TAKE 1 TABLET BY MOUTH ONCE DAILY  . Lancets (ONETOUCH ULTRASOFT) lancets Use to test blood sugar 5 times a week E11.65  . latanoprost (XALATAN) 0.005 %  ophthalmic solution Place 1 drop into both eyes at bedtime.   [name] losartan (COZAAR) 25 MG tablet TAKE 1 TABLET BY MOUTH ONCE DAILY  . LUTEIN PO Take 1 tablet by mouth daily.  . metFORMIN (GLUCOPHAGE) 500 MG tablet Take 1  tablet (500 mg total) by mouth 2 (two) times daily with a meal.  . metoprolol succinate (TOPROL-XL) 50 MG 24 hr tablet TAKE 1 TABLET BY MOUTH ONCE DAILY NEEDS APPOINTMENT FOR FUTURE REFILLS  . Multiple Vitamin (MULTIVITAMIN) tablet Take 1 tablet by mouth daily.  . nitroGLYCERIN (NITROSTAT) 0.4 MG SL tablet Place 1 tablet (0.4 mg total) under the tongue every 5 (five) minutes as needed for chest pain (x 3 doses max).  [name] omeprazole (PRILOSEC) 40 MG capsule TAKE 1 CAPSULE BY MOUTH ONCE DAILY IN THE EVENING  . simvastatin (ZOCOR) 20 MG tablet TAKE ONE TABLET BY MOUTH IN THE EVENING  . guaiFENesin-codeine 100-10 MG/5ML syrup Take 5 mLs by mouth every 6 (six) hours as needed for cough (do not drive for 8 hours after taking). (Patient not taking: Reported on 09/17/2017)  . ipratropium (ATROVENT) 0.06 % nasal spray Place 2 sprays into both nostrils 4 (four) times daily. (Patient not taking: Reported on 09/17/2017)  . predniSONE (DELTASONE) 20 MG tablet Take 1 tablet by mouth daily for 5 days, then 1/2 tablet daily for 2 days (Patient not taking: Reported on 09/17/2017)  . [DISCONTINUED] azithromycin (ZITHROMAX) 250 MG tablet Take 2 tabs on day 1, then 1 tab daily until finished (Patient not taking: Reported on 09/17/2017)   No facility-administered encounter medications on file as of 09/17/2017.     Activities of Daily Living In your present state of health, do you have any difficulty performing the following activities: 09/17/2017  Hearing? N  Vision? N  Difficulty concentrating or making decisions? N  Walking or climbing stairs? N  Dressing or bathing? N  Doing errands, shopping? N  Preparing Food and eating ? N  Using the Toilet? N  In the past six months, have you accidently leaked urine? N   Do you have problems with loss of bowel control? Y  Comment discussed diarrhea  Managing your Medications? N  Managing your Finances? N  Housekeeping or managing your Housekeeping? N  Some recent data might be hidden    Patient Care Team: Marin Olp, MD as PCP - General (Family Medicine)   Assessment:   This is a routine wellness examination for Kiran.  Exercise Activities and Dietary recommendations Current Exercise Habits: Home exercise routine, Intensity: Mild  Goals    . patient     Good luck looking for new passions [name] watching or other Try to stay engage -      . patient     Will feel empowered and take more control my your health!    . Patient Stated     Improve my  activity  Has a bike at home; can start using a little at a time  You value vitality  May explore fit bit or apple watch         Fall Risk Fall Risk  09/17/2017 09/16/2016 09/09/2016 09/02/2016 04/14/2016  Falls in the past year? Yes No No No No  Comment had an issue with vertigo and is not resolrved - - - -  Number falls in past yr: 2 or more - - - -  Injury with Fall? No - - - -     Depression Screen PHQ 2/9 Scores 09/17/2017 09/16/2016 09/09/2016 09/02/2016  PHQ - 2 Score 0 0 0 0    Cognitive Function MMSE - Mini Mental State Exam 09/17/2017 09/17/2017  Not completed: (No Data) (No Data)     Ad8 score reviewed for issues:  Issues making decisions:  Less interest in hobbies / activities:  Repeats questions, stories (family complaining):  Trouble using ordinary gadgets (microwave, computer, phone):  Forgets the month or year:   Mismanaging finances:   Remembering appts:  Daily problems with thinking and/or memory: Ad8 score is=        Immunization History  Administered Date(s) Administered  . Influenza Whole 10/23/2008, 10/12/2009  . Influenza, High Dose Seasonal PF 11/11/2013, 11/01/2015, 11/03/2016  . Influenza-Unspecified 12/02/2012  . Pneumococcal Conjugate-13  11/01/2015  . Pneumococcal Polysaccharide-23 10/18/2007  . Td 03/25/2012     Screening Tests Health Maintenance  Topic Date Due  . INFLUENZA VACCINE  08/13/2017  . HEMOGLOBIN A1C  11/14/2017  . OPHTHALMOLOGY EXAM  02/11/2018  . FOOT EXAM  07/21/2018  . TETANUS/TDAP  03/26/2022  . PNA vac Low Risk Adult  Completed         Plan:      PCP Notes  Health Maintenance PSA 04/2017 Diabetic eye exam 01/2017    Given flu vaccine today   Abnormal Screens  none  Referrals  none  Patient concerns; Diarrhea; in the am stools are solid After 6 h, he has diarrhea; can be 2 times and up to 5  May want to make an apt to discuss alternatives    Can't sleep through the night  Recommended he consider slowing down on ETOH Either one shot or one glass of wine that may be interfering with sleep   Nurse Concerns; As noted   Next PCP apt 12/24/2017       I have personally reviewed and noted the following in the patient's chart:   . Medical and social history . Use of alcohol, tobacco or illicit drugs  . Current medications and supplements . Functional ability and status . Nutritional status . Physical activity . Advanced directives . List of other physicians . Hospitalizations, surgeries, and ER visits in previous 12 months . Vitals . Screenings to include cognitive, depression, and falls . Referrals and appointments  In addition, I have reviewed and discussed with patient certain preventive protocols, quality metrics, and best practice recommendations. A written personalized care plan for preventive services as well as general preventive health recommendations were provided to patient.     Wynetta Fines, RN  09/17/2017

## 2017-09-17 ENCOUNTER — Encounter: Payer: Self-pay | Admitting: Family Medicine

## 2017-09-17 ENCOUNTER — Ambulatory Visit (INDEPENDENT_AMBULATORY_CARE_PROVIDER_SITE_OTHER): Payer: Medicare Other

## 2017-09-17 DIAGNOSIS — Z23 Encounter for immunization: Secondary | ICD-10-CM

## 2017-09-17 DIAGNOSIS — Z0000 Encounter for general adult medical examination without abnormal findings: Secondary | ICD-10-CM | POA: Diagnosis not present

## 2017-09-17 NOTE — Progress Notes (Signed)
I have reviewed and agree with note, evaluation, plan.   [name]- thanks for info given on alcohol and potential sleep effects. I know patient has visit in December but please make sure he knows I am willing to see him sooner for stool concerns if needed.   Garret Reddish, MD

## 2017-09-17 NOTE — Patient Instructions (Addendum)
[name] , Thank you for taking time to come for your Medicare Wellness Visit. I appreciate your ongoing commitment to your health goals. Please review the following plan we discussed and let me know if I can assist you in the future.   For purchase of hearing aids http://[email or web address].acquia-[email or web address]/sites/default/files/hearingpedia/Guide_How_to_Buy_Hearing_Aids.pdf  May need a refill on NTG   May want to make an apt to discuss alternatives if the Metformin is causing diarrhea  Also can't sleep; may try stopping one drink at hs, either shot or 1 glass of wine    These are the goals we discussed: Goals    . patient     Good luck looking for new passions [name] watching or other Try to stay engage -      . patient     Will feel empowered and take more control my your health!    . Patient Stated     Improve my  activity  Has a bike at home; can start using a little at a time  You value vitality  May explore fit bit or apple watch         This is a list of the screening recommended for you and due dates:  Health Maintenance  Topic Date Due  . Flu Shot  08/13/2017  . Hemoglobin A1C  11/14/2017  . Eye exam for diabetics  02/11/2018  . Complete foot exam   07/21/2018  . Tetanus Vaccine  03/26/2022  . Pneumonia vaccines  Completed   Fall Prevention in the Home Falls can cause injuries and can affect people from all age groups. There are many simple things that you can do to make your home safe and to help prevent falls. What can I do on the outside of my home?  Regularly repair the edges of walkways and driveways and fix any cracks.  Remove high doorway thresholds.  Trim any shrubbery on the main path into your home.  Use bright outdoor lighting.  Clear walkways of debris and clutter, including tools and rocks.  Regularly check that handrails are securely fastened and in good repair. Both sides of any steps should have handrails.  Install guardrails  along the edges of any raised decks or porches.  Have leaves, snow, and ice cleared regularly.  Use sand or salt on walkways during winter months.  In the garage, clean up any spills right away, including grease or oil spills. What can I do in the bathroom?  Use night lights.  Install grab bars by the toilet and in the tub and shower. Do not use towel bars as grab bars.  Use non-skid mats or decals on the floor of the tub or shower.  If you need to sit down while you are in the shower, use a plastic, non-slip stool.  Keep the floor dry. Immediately clean up any water that spills on the floor.  Remove soap buildup in the tub or shower on a regular basis.  Attach bath mats securely with double-sided non-slip rug tape.  Remove throw rugs and other tripping hazards from the floor. What can I do in the bedroom?  Use night lights.  Make sure that a bedside light is easy to reach.  Do not use oversized bedding that drapes onto the floor.  Have a firm chair that has side arms to use for getting dressed.  Remove throw rugs and other tripping hazards from the floor. What can I do in the kitchen?  Clean up any spills right away.  Avoid walking on wet floors.  Place frequently used items in easy-to-reach places.  If you need to reach for something above you, use a sturdy step stool that has a grab bar.  Keep electrical cables out of the way.  Do not use floor polish or wax that makes floors slippery. If you have to use wax, make sure that it is non-skid floor wax.  Remove throw rugs and other tripping hazards from the floor. What can I do in the stairways?  Do not leave any items on the stairs.  Make sure that there are handrails on both sides of the stairs. Fix handrails that are broken or loose. Make sure that handrails are as long as the stairways.  Check any carpeting to make sure that it is firmly attached to the stairs. Fix any carpet that is loose or worn.  Avoid  having throw rugs at the top or bottom of stairways, or secure the rugs with carpet tape to prevent them from moving.  Make sure that you have a light switch at the top of the stairs and the bottom of the stairs. If you do not have them, have them installed. What are some other fall prevention tips?  Wear closed-toe shoes that fit well and support your feet. Wear shoes that have rubber soles or low heels.  When you use a stepladder, make sure that it is completely opened and that the sides are firmly locked. Have someone hold the ladder while you are using it. Do not climb a closed stepladder.  Add color or contrast paint or tape to grab bars and handrails in your home. Place contrasting color strips on the first and last steps.  Use mobility aids as needed, such as canes, walkers, scooters, and crutches.  Turn on lights if it is dark. Replace any light bulbs that burn out.  Set up furniture so that there are clear paths. Keep the furniture in the same spot.  Fix any uneven floor surfaces.  Choose a carpet design that does not hide the edge of steps of a stairway.  Be aware of any and all pets.  Review your medicines with your healthcare provider. Some medicines can cause dizziness or changes in blood pressure, which increase your risk of falling. Talk with your health care provider about other ways that you can decrease your risk of falls. This may include working with a physical therapist or trainer to improve your strength, balance, and endurance. This information is not intended to replace advice given to you by your health care provider. Make sure you discuss any questions you have with your health care provider. Document Released: 12/20/2001 Document Revised: 05/29/2015 Document Reviewed: 02/03/2014 Elsevier Interactive Patient Education  2018 [employer] Maintenance, Male A healthy lifestyle and preventive care is important for your health and wellness. Ask your  health care provider about what schedule of regular examinations is right for you. What should I know about weight and diet? Eat a Healthy Diet  Eat plenty of vegetables, fruits, whole grains, low-fat dairy products, and lean protein.  Do not eat a lot of foods high in solid fats, added sugars, or salt.  Maintain a Healthy Weight Regular exercise can help you achieve or maintain a healthy weight. You should:  Do at least 150 minutes of exercise each week. The exercise should increase your heart rate and make you sweat (moderate-intensity exercise).  Do strength-training exercises at least twice a week.  Watch Your Levels  of Cholesterol and Blood Lipids  Have your blood tested for lipids and cholesterol every 5 years starting at 81 years of age. If you are at high risk for heart disease, you should start having your blood tested when you are 81 years old. You may need to have your cholesterol levels checked more often if: ? Your lipid or cholesterol levels are high. ? You are older than 81 years of age. ? You are at high risk for heart disease.  What should I know about cancer screening? Many types of cancers can be detected early and may often be prevented. Lung Cancer  You should be screened every year for lung cancer if: ? You are a current smoker who has smoked for at least 30 years. ? You are a former smoker who has quit within the past 15 years.  Talk to your health care provider about your screening options, when you should start screening, and how often you should be screened.  Colorectal Cancer  Routine colorectal cancer screening usually begins at 82 years of age and should be repeated every 5-10 years until you are 81 years old. You may need to be screened more often if early forms of precancerous polyps or small growths are found. Your health care provider may recommend screening at an earlier age if you have risk factors for colon cancer.  Your health care provider may  recommend using home test kits to check for hidden blood in the stool.  A small camera at the end of a tube can be used to examine your colon (sigmoidoscopy or colonoscopy). This checks for the earliest forms of colorectal cancer.  Prostate and Testicular Cancer  Depending on your age and overall health, your health care provider may do certain tests to screen for prostate and testicular cancer.  Talk to your health care provider about any symptoms or concerns you have about testicular or prostate cancer.  Skin Cancer  Check your skin from head to toe regularly.  Tell your health care provider about any new moles or changes in moles, especially if: ? There is a change in a mole's size, shape, or color. ? You have a mole that is larger than a pencil eraser.  Always use sunscreen. Apply sunscreen liberally and repeat throughout the day.  Protect yourself by wearing long sleeves, pants, a wide-brimmed hat, and sunglasses when outside.  What should I know about heart disease, diabetes, and high blood pressure?  If you are 66-24 years of age, have your blood pressure checked every 3-5 years. If you are 62 years of age or older, have your blood pressure checked every year. You should have your blood pressure measured twice-once when you are at a hospital or clinic, and once when you are not at a hospital or clinic. Record the average of the two measurements. To check your blood pressure when you are not at a hospital or clinic, you can use: ? An automated blood pressure machine at a pharmacy. ? A home blood pressure monitor.  Talk to your health care provider about your target blood pressure.  If you are between 24-72 years old, ask your health care provider if you should take aspirin to prevent heart disease.  Have regular diabetes screenings by checking your fasting blood sugar level. ? If you are at a normal weight and have a low risk for diabetes, have this test once every three years  after the age of 57. ? If you are overweight and  have a high risk for diabetes, consider being tested at a younger age or more often.  A one-time screening for abdominal aortic aneurysm (AAA) by ultrasound is recommended for men aged 32-75 years who are current or former smokers. What should I know about preventing infection? Hepatitis B If you have a higher risk for hepatitis B, you should be screened for this virus. Talk with your health care provider to find out if you are at risk for hepatitis B infection. Hepatitis C Blood testing is recommended for:  Everyone born from [birth year] through 1963.  Anyone with known risk factors for hepatitis C.  Sexually Transmitted Diseases (STDs)  You should be screened each year for STDs including gonorrhea and chlamydia if: ? You are sexually active and are younger than 81 years of age. ? You are older than 81 years of age and your health care provider tells you that you are at risk for this type of infection. ? Your sexual activity has changed since you were last screened and you are at an increased risk for chlamydia or gonorrhea. Ask your health care provider if you are at risk.  Talk with your health care provider about whether you are at high risk of being infected with HIV. Your health care provider may recommend a prescription medicine to help prevent HIV infection.  What else can I do?  Schedule regular health, dental, and eye exams.  Stay current with your vaccines (immunizations).  Do not use any tobacco products, such as cigarettes, chewing tobacco, and e-cigarettes. If you need help quitting, ask your health care provider.  Limit alcohol intake to no more than 2 drinks per day. One drink equals 12 ounces of beer, 5 ounces of wine, or 1 ounces of hard liquor.  Do not use street drugs.  Do not share needles.  Ask your health care provider for help if you need support or information about quitting drugs.  Tell your health care  provider if you often feel depressed.  Tell your health care provider if you have ever been abused or do not feel safe at home. This information is not intended to replace advice given to you by your health care provider. Make sure you discuss any questions you have with your health care provider. Document Released: 06/28/2007 Document Revised: 08/29/2015 Document Reviewed: 10/03/2014 Elsevier Interactive Patient Education  [employer].

## 2017-09-21 ENCOUNTER — Other Ambulatory Visit: Payer: Self-pay | Admitting: Family Medicine

## 2017-09-21 ENCOUNTER — Telehealth: Payer: Self-pay

## 2017-09-21 NOTE — Telephone Encounter (Signed)
-----   Message from Marin Olp, MD sent at 09/17/2017  7:58 PM EDT -----   ----- Message ----- From: Wynetta Fines, RN Sent: 09/17/2017   5:35 PM EDT To: Marin Olp, MD

## 2017-09-21 NOTE — Telephone Encounter (Signed)
Call to [name] to let him know [name] Channel would be happy to see him regarding further concerns with diarrhea and medication.  Was driving and said he would do that if needed.  Wynetta Fines RN

## 2017-09-22 ENCOUNTER — Encounter (INDEPENDENT_AMBULATORY_CARE_PROVIDER_SITE_OTHER): Payer: Medicare Other | Admitting: Ophthalmology

## 2017-09-22 ENCOUNTER — Other Ambulatory Visit: Payer: Self-pay

## 2017-09-22 DIAGNOSIS — I10 Essential (primary) hypertension: Secondary | ICD-10-CM | POA: Diagnosis not present

## 2017-09-22 DIAGNOSIS — H35033 Hypertensive retinopathy, bilateral: Secondary | ICD-10-CM

## 2017-09-22 DIAGNOSIS — H353211 Exudative age-related macular degeneration, right eye, with active choroidal neovascularization: Secondary | ICD-10-CM | POA: Diagnosis not present

## 2017-09-22 DIAGNOSIS — H353122 Nonexudative age-related macular degeneration, left eye, intermediate dry stage: Secondary | ICD-10-CM

## 2017-09-22 DIAGNOSIS — H43813 Vitreous degeneration, bilateral: Secondary | ICD-10-CM

## 2017-09-22 MED ORDER — GLUCOSE BLOOD VI STRP: ORAL_STRIP | 1 refills | Status: DC

## 2017-10-01 ENCOUNTER — Encounter: Payer: Self-pay | Admitting: Cardiology

## 2017-10-08 ENCOUNTER — Encounter (INDEPENDENT_AMBULATORY_CARE_PROVIDER_SITE_OTHER): Payer: Medicare Other | Admitting: Ophthalmology

## 2017-10-08 DIAGNOSIS — E119 Type 2 diabetes mellitus without complications: Secondary | ICD-10-CM | POA: Diagnosis not present

## 2017-10-08 DIAGNOSIS — H26491 Other secondary cataract, right eye: Secondary | ICD-10-CM

## 2017-10-12 NOTE — Progress Notes (Signed)
Derek [name] Date of Birth: 07/21/1934   History of Present Illness: [name] is seen today for followup CAD. He has a history of coronary disease and is status post CABG in 2006.  He had a normal stress Myoview study in August 2017. He has a history of  hairy cell leukemia. He was treated with Cladribine at [name] completed in 2014. This year he had recurrence and was treated with chemo therapy with Pentostatin and Rituxan. BM biopsy in June 2017 was normal but on follow up in August 2018 he had recurrence. Treated with Rituxan and vemurafenib. Completed treatment in November 2019.   On follow up today he is doing well from a cardiac standpoint.   He denies any symptoms of chest pain or SOB except when he has some indigestion. Has not had to use sl Ntg.   He does note more problems with osteoarthritis in his knees. This limits his activity. He also notes some recurrent bouts of diarrhea since he has been on metformin. This is followed by [name].   Current Outpatient Medications on File Prior to Visit  Medication Sig Dispense Refill  . blood glucose meter kit and supplies Use to check blood sugar 5 times a week. Dx Code E11.65. 1 each 0  . glucose blood (ONETOUCH VERIO) test strip USE TO CHECK BLOOD GLUCOSE 5 TIMES PER WEEK E11.9 100 each 1  . Lancets (ONETOUCH ULTRASOFT) lancets Use to test blood sugar 5 times a week E11.65 100 each 4  . latanoprost (XALATAN) 0.005 % ophthalmic solution Place 1 drop into both eyes at bedtime.     . LUTEIN PO Take 1 tablet by mouth daily.    . metFORMIN (GLUCOPHAGE) 500 MG tablet Take 1 tablet (500 mg total) by mouth 2 (two) times daily with a meal. 180 tablet 3  . Multiple Vitamin (MULTIVITAMIN) tablet Take 1 tablet by mouth daily.    [name] omeprazole (PRILOSEC) 40 MG capsule TAKE 1 CAPSULE BY MOUTH ONCE DAILY IN THE EVENING 30 capsule 3   No current facility-administered medications on file prior to visit.     Allergies   Allergen Reactions  . Clindamycin Other (See Comments)    Unknown reaction from childhood Other reaction(s): Other (See Comments) Unknown reaction from childhood     Past Medical History:  Diagnosis Date  . Arthritis   . Bladder neck contracture   . Cataract immature BILATERAL  . Coronary artery disease CARDIOLOGIST- [name]-  LAST VISIT NOTE 12-20-2010 IN EPIC AND W/ CHART   subendocranial MI; left inter. mamm to LAD, saph vein to diag; saph vein to distal circ; saph vein to 1st obtuse  . Diabetes mellitus without complication ([place])   . DIVERTICULOSIS, COLON 09/05/2003   Qualifier: Diagnosis of  By: [name] (AAMA), [name]    . EUSTACHIAN TUBE DYSFUNCTION, BILATERAL 06/22/2008   Qualifier: Diagnosis of  By: [name], [name]    . EXTERNAL HEMORRHOIDS 12/14/2007   Qualifier: Diagnosis of  By: Derek [name] (AAMA), [name]    . Frequency of urination   . GERD (gastroesophageal reflux disease)   . Glaucoma BILATERAL  . H/O hiatal hernia   . Hairy cell leukemia ([place])   . History of atherosclerotic cardiovascular disease   . History of kidney stones   . History of myocardial infarction 2006   S/P CABG  . History of prostate cancer S/P PROSTATECTOMY-  NO RECURRENCE   FOLLOWED BY Derek [name]  . Hyperlipidemia   .  Hypertension   . Hypertriglyceridemia   . Insomnia   . Macular degeneration   . Nocturia   . Raynaud's phenomenon   . Right arm cellulitis 04/02/12   post cat bite  . S/P CABG x 4 2006  . Stable angina ([facility])   . Urgency of urination     Past Surgical History:  Procedure Laterality Date  . BALLOON DILATION  04/30/2011   Procedure: BALLOON DILATION;  Surgeon: Derek Amass, Nash;  Location: [facility];  Service: Urology;  Laterality: N/A;  . CARDIAC CATHETERIZATION  07-30-2006  [facility] [name]   POST CABG / OCCLUDED DIAGONAL &  FOURTH OBTUSE MARGINAL GRAFTS/ NORMAL LVF/ PATENT LIMA TO LAD & SEPHENOUS VEIN GRAFT TO 1ST OBTUSE MARGINAL   . CARDIAC  CATHETERIZATION  04-23-2004   CRITICAL THREE-VESSEL CAD/ PRESERVED LVF  . CORONARY ARTERY BYPASS GRAFT  04-25-2004  X4 VESSELS   left inter. mamm to LAD, saph vein to diag; saph vein to distal circ; saph vein to 1st obtuse  . CYSTOSCOPY  04/30/2011   Procedure: CYSTOSCOPY;  Surgeon: Derek Amass, Nash;  Location: [name];  Service: Urology;  Laterality: N/A;  30 MINUTES   . DEBRIDEMENT TENNIS ELBOW  2002   RIGHT; [facility]  . LAPAROSCOPIC CHOLECYSTECTOMY  02-05-2005  . ROBOT ASSISTED LAPAROSCOPIC RADICAL PROSTATECTOMY  05-11-2006    Social History   Tobacco Use  Smoking Status Former Smoker  . Years: 3.00  . Types: Cigarettes, Cigars  Smokeless Tobacco Never Used  Tobacco Comment   QUIT SMOKING CIGARETTES 1964 AND QUIT OCCASIONAL CIGAR  2007    Social History   Substance and Sexual Activity  Alcohol Use Yes  . Alcohol/week: 14.0 standard drinks  . Types: 7 Glasses of wine, 7 Shots of liquor per week   Comment: has dinner drink shot and 2 glasses of wine     Family History  Problem Relation Age of Onset  . Heart disease Sister        CABGX 1 sister and stents X1 sister  . Cancer Sister   . Parkinsonism Father   . Diabetes Neg Hx   . Stroke Neg Hx     Review of Systems: As noted in history of present illness. All other systems were reviewed and are negative.  Physical Exam: BP 140/78   Pulse 63   Ht '5\' 11"'  (1.803 m)   Wt 203 lb 3.2 oz (92.2 kg)   BMI 28.34 kg/m  GENERAL:  Well appearing WM in NAD HEENT:  PERRL, EOMI, sclera are clear. Oropharynx is clear. NECK:  No jugular venous distention, carotid upstroke brisk and symmetric, no bruits, no thyromegaly or adenopathy LUNGS:  Clear to auscultation bilaterally CHEST:  Unremarkable HEART:  RRR,  PMI not displaced or sustained,S1 and S2 within normal limits, no S3, no S4: no clicks, no rubs, gr 2/6 SEM ABD:  Soft, nontender. BS +, no masses or bruits. No hepatomegaly, no splenomegaly EXT:  2 +  pulses throughout, no edema, no cyanosis no clubbing SKIN:  Warm and dry.  No rashes NEURO:  Alert and oriented x 3. Cranial nerves II through XII intact. PSYCH:  Cognitively intact    LABORATORY DATA: Lab Results  Component Value Date   WBC 0.2 (LL) 10/08/2016   HGB 7.1 (L) 10/08/2016   HCT 19.2 (L) 10/08/2016   PLT 54 (L) 10/08/2016   GLUCOSE 163 (H) 10/08/2016   CHOL 128 07/17/2016   TRIG 299 (H) 07/17/2016  HDL 33 (L) 07/17/2016   LDLCALC 35 07/17/2016   ALT 52 (H) 07/17/2016   AST 37 07/17/2016   NA 132 (L) 10/08/2016   K 3.9 10/08/2016   CL 105 10/08/2016   CREATININE 1.30 (H) 10/08/2016   BUN 23 (H) 10/08/2016   CO2 14 (L) 10/08/2016   TSH 0.559 03/26/2012   PSA <0.015 04/24/2017   INR 1.19 03/29/2012   HGBA1C 6.8 05/14/2017   Labs from [name] reviewed from July 11, 2015. WBC 3.5, plts 140K. Hgb 12.5. Chemistry panel is normal.  Bone marrow biopsy on 06/28/15 showed normal marrow without evidence of leukemia.  Labs dated 05/08/16: glucose 247, creatinine 1.09, AST 41, AST 52. Other chemistries normal. Hgb normal. Dated 08/13/17: A1c 6.6%. Glucose 153, otherwise CMET normal. plts 111K, Hgb normal.   Ecg today shows NSR with LAFB. LVH by voltage. No acute change. I have personally reviewed and interpreted this study.   Myoview 08/28/15: Study Highlights     Nuclear stress EF: 63%. Post CABG septal wall hypokinesis  There was no ST segment deviation noted during stress.  Defect 1: There is a small defect of mild severity present in the apex location. No ischemia.  This is a low risk study.     Assessment / Plan: 1. Coronary disease status post CABG. No significant anginal symptoms. Myoview study in August 2017 was normal. Continue aspirin, isosorbide, and metoprolol.  2. Hypertension. Blood pressure is well controlled.   3. Hyperlipidemia.  Continue current Zocor dose. Higher doses in the past lead to myalgias. He is due for complete  physical with Derek [name] in December and will have lipids checked at that time.   4. Hairy cell leukemia. Recurrent. S/p repeat chemotherapy. Followed by Derek. Linus Orn at [facility].   5. DM on metformin. Having some GI side effects with diarrhea. Recommend addressing this with [name].   6. Macular degeneration.  I will follow up in one year.

## 2017-10-14 ENCOUNTER — Ambulatory Visit: Payer: Medicare Other | Admitting: Cardiology

## 2017-10-14 ENCOUNTER — Encounter: Payer: Self-pay | Admitting: Cardiology

## 2017-10-14 ENCOUNTER — Encounter

## 2017-10-14 VITALS — BP 140/78 | HR 63 | Ht 71.0 in | Wt 203.2 lb

## 2017-10-14 DIAGNOSIS — E7800 Pure hypercholesterolemia, unspecified: Secondary | ICD-10-CM | POA: Diagnosis not present

## 2017-10-14 DIAGNOSIS — I25810 Atherosclerosis of coronary artery bypass graft(s) without angina pectoris: Secondary | ICD-10-CM

## 2017-10-14 DIAGNOSIS — I10 Essential (primary) hypertension: Secondary | ICD-10-CM

## 2017-10-14 MED ORDER — NITROGLYCERIN 0.4 MG SL SUBL: 0.4000 mg | SUBLINGUAL_TABLET | SUBLINGUAL | 3 refills | Status: AC | PRN

## 2017-10-14 MED ORDER — METOPROLOL SUCCINATE ER 50 MG PO TB24: 50.0000 mg | ORAL_TABLET | Freq: Every day | ORAL | 3 refills | Status: DC

## 2017-10-14 MED ORDER — ISOSORBIDE MONONITRATE ER 60 MG PO TB24: 60.0000 mg | ORAL_TABLET | Freq: Every day | ORAL | 3 refills | Status: DC

## 2017-10-14 MED ORDER — LOSARTAN POTASSIUM 25 MG PO TABS: 25.0000 mg | ORAL_TABLET | Freq: Every day | ORAL | 3 refills | Status: DC

## 2017-10-14 MED ORDER — SIMVASTATIN 20 MG PO TABS: 20.0000 mg | ORAL_TABLET | Freq: Every evening | ORAL | 3 refills | Status: DC

## 2017-10-27 ENCOUNTER — Encounter (INDEPENDENT_AMBULATORY_CARE_PROVIDER_SITE_OTHER): Payer: Medicare Other | Admitting: Ophthalmology

## 2017-10-27 DIAGNOSIS — H353122 Nonexudative age-related macular degeneration, left eye, intermediate dry stage: Secondary | ICD-10-CM

## 2017-10-27 DIAGNOSIS — H35033 Hypertensive retinopathy, bilateral: Secondary | ICD-10-CM

## 2017-10-27 DIAGNOSIS — I10 Essential (primary) hypertension: Secondary | ICD-10-CM

## 2017-10-27 DIAGNOSIS — H353211 Exudative age-related macular degeneration, right eye, with active choroidal neovascularization: Secondary | ICD-10-CM

## 2017-10-27 DIAGNOSIS — H43813 Vitreous degeneration, bilateral: Secondary | ICD-10-CM

## 2017-10-30 DIAGNOSIS — E119 Type 2 diabetes mellitus without complications: Secondary | ICD-10-CM | POA: Diagnosis not present

## 2017-10-30 DIAGNOSIS — H401111 Primary open-angle glaucoma, right eye, mild stage: Secondary | ICD-10-CM | POA: Diagnosis not present

## 2017-10-30 DIAGNOSIS — H353212 Exudative age-related macular degeneration, right eye, with inactive choroidal neovascularization: Secondary | ICD-10-CM | POA: Diagnosis not present

## 2017-12-01 ENCOUNTER — Encounter (INDEPENDENT_AMBULATORY_CARE_PROVIDER_SITE_OTHER): Payer: Medicare Other | Admitting: Ophthalmology

## 2017-12-01 DIAGNOSIS — I10 Essential (primary) hypertension: Secondary | ICD-10-CM | POA: Diagnosis not present

## 2017-12-01 DIAGNOSIS — H35033 Hypertensive retinopathy, bilateral: Secondary | ICD-10-CM | POA: Diagnosis not present

## 2017-12-01 DIAGNOSIS — H43813 Vitreous degeneration, bilateral: Secondary | ICD-10-CM

## 2017-12-01 DIAGNOSIS — H353211 Exudative age-related macular degeneration, right eye, with active choroidal neovascularization: Secondary | ICD-10-CM | POA: Diagnosis not present

## 2017-12-01 DIAGNOSIS — H353122 Nonexudative age-related macular degeneration, left eye, intermediate dry stage: Secondary | ICD-10-CM | POA: Diagnosis not present

## 2017-12-04 DIAGNOSIS — H401111 Primary open-angle glaucoma, right eye, mild stage: Secondary | ICD-10-CM | POA: Diagnosis not present

## 2017-12-17 DIAGNOSIS — I2510 Atherosclerotic heart disease of native coronary artery without angina pectoris: Secondary | ICD-10-CM | POA: Diagnosis not present

## 2017-12-17 DIAGNOSIS — G4700 Insomnia, unspecified: Secondary | ICD-10-CM | POA: Diagnosis not present

## 2017-12-17 DIAGNOSIS — C9142 Hairy cell leukemia, in relapse: Secondary | ICD-10-CM | POA: Diagnosis not present

## 2017-12-17 LAB — HEMOGLOBIN A1C: HEMOGLOBIN A1C: 6.4

## 2017-12-24 ENCOUNTER — Encounter: Payer: Self-pay | Admitting: Family Medicine

## 2017-12-24 ENCOUNTER — Ambulatory Visit (INDEPENDENT_AMBULATORY_CARE_PROVIDER_SITE_OTHER): Payer: Medicare Other | Admitting: Family Medicine

## 2017-12-24 VITALS — BP 136/78 | HR 49 | Temp 98.1°F | Ht 71.0 in | Wt 205.6 lb

## 2017-12-24 DIAGNOSIS — E1165 Type 2 diabetes mellitus with hyperglycemia: Secondary | ICD-10-CM

## 2017-12-24 DIAGNOSIS — Z6828 Body mass index (BMI) 28.0-28.9, adult: Secondary | ICD-10-CM

## 2017-12-24 DIAGNOSIS — Z86010 Personal history of colonic polyps: Secondary | ICD-10-CM

## 2017-12-24 DIAGNOSIS — E7800 Pure hypercholesterolemia, unspecified: Secondary | ICD-10-CM

## 2017-12-24 DIAGNOSIS — I25810 Atherosclerosis of coronary artery bypass graft(s) without angina pectoris: Secondary | ICD-10-CM

## 2017-12-24 DIAGNOSIS — C9141 Hairy cell leukemia, in remission: Secondary | ICD-10-CM

## 2017-12-24 DIAGNOSIS — I10 Essential (primary) hypertension: Secondary | ICD-10-CM

## 2017-12-24 DIAGNOSIS — Z0000 Encounter for general adult medical examination without abnormal findings: Secondary | ICD-10-CM | POA: Diagnosis not present

## 2017-12-24 DIAGNOSIS — D61818 Other pancytopenia: Secondary | ICD-10-CM

## 2017-12-24 LAB — LIPID PANEL
CHOL/HDL RATIO: 3
Cholesterol: 154 mg/dL (ref 0–200)
HDL: 50.3 mg/dL (ref >39.00)
NonHDL: 103.66
Triglycerides: 328 mg/dL — ABNORMAL HIGH (ref 0.0–149.0)
VLDL: 65.6 mg/dL — ABNORMAL HIGH (ref 0.0–40.0)

## 2017-12-24 LAB — LDL CHOLESTEROL, DIRECT: Direct LDL: 78 mg/dL

## 2017-12-24 MED ORDER — OMEPRAZOLE 40 MG PO CPDR: 40.0000 mg | DELAYED_RELEASE_CAPSULE | Freq: Every day | ORAL | 3 refills | Status: DC

## 2017-12-24 NOTE — Addendum Note (Signed)
Addended by: Lucianne Lei M on: 12/24/2017 11:53 AM   Modules accepted: Orders

## 2017-12-24 NOTE — Assessment & Plan Note (Signed)
S:  controlled on metformin 500 mg twice daily-A1c goal 8 or less CBGs- 103-170 2 hour post breakfast Lab Results  Component Value Date   HGBA1C 6.4 12/17/2017   HGBA1C 6.8 05/14/2017   HGBA1C 6.2 02/20/2017   A/P: Stable-continue current medications

## 2017-12-24 NOTE — Progress Notes (Signed)
Phone: [phone]  Subjective:  Patient presents today for their annual physical. Chief complaint-noted.   See problem oriented charting- ROS- full  review of systems was completed and negative except for: hearing loss, sneezing, eye issues- vision, light sensitivity, discharge, itching- after injection in eye, mild cough, diarrhea, nausea, cold intolerance, urinary incontinence, joint pain, allergies, dzzy at times, tremor, insomnia.   The following were reviewed and entered/updated in epic: Past Medical History:  Diagnosis Date  . Arthritis   . Bladder neck contracture   . Cataract immature BILATERAL  . Coronary artery disease CARDIOLOGIST- DR Martinique-  LAST VISIT NOTE 12-20-2010 IN EPIC AND W/ CHART   subendocranial MI; left inter. mamm to LAD, saph vein to diag; saph vein to distal circ; saph vein to 1st obtuse  . Diabetes mellitus without complication ([place])   . DIVERTICULOSIS, COLON 09/05/2003   Qualifier: Diagnosis of  By: Nolon Rod CMA (AAMA), Robin    . EUSTACHIAN TUBE DYSFUNCTION, BILATERAL 06/22/2008   Qualifier: Diagnosis of  By: Linna Darner MD, William    . EXTERNAL HEMORRHOIDS 12/14/2007   Qualifier: Diagnosis of  By: Nolon Rod CMA (AAMA), Robin    . Frequency of urination   . GERD (gastroesophageal reflux disease)   . Glaucoma BILATERAL  . H/O hiatal hernia   . Hairy cell leukemia ([place])   . History of atherosclerotic cardiovascular disease   . History of kidney stones   . History of myocardial infarction 2006   S/P CABG  . History of prostate cancer S/P PROSTATECTOMY-  NO RECURRENCE   FOLLOWED BY DR Risa Grill  . Hyperlipidemia   . Hypertension   . Hypertriglyceridemia   . Insomnia   . Macular degeneration   . Nocturia   . Raynaud's phenomenon   . Right arm cellulitis 04/02/12   post cat bite  . S/P CABG x 4 2006  . Stable angina ([facility])   . Urgency of urination    Patient Active Problem List   Diagnosis Date Noted  . Uncontrolled type 2 diabetes mellitus with  hyperglycemia, without long-term current use of insulin ([place]) 07/31/2016    Priority: High  . Hairy cell leukemia ([place]) 04/01/2012    Priority: High  . Pancytopenia ([place]) 03/25/2012    Priority: High  . Coronary artery disease     Priority: High  . History of prostate cancer 05/15/2015    Priority: Medium  . Hyperglycemia 05/15/2015    Priority: Medium  . GERD (gastroesophageal reflux disease) 04/25/2015    Priority: Medium  . IBS (irritable bowel syndrome) 07/21/2012    Priority: Medium  . Hypertension     Priority: Medium  . Hypercholesterolemia 04/20/2006    Priority: Medium  . Muscle cramping 01/23/2015    Priority: Low  . Raynaud's phenomenon 02/10/2012    Priority: Low  . Essential tremor 12/20/2010    Priority: Low  . Personal history of colonic polyps 12/15/2007    Priority: Low  . Actinic keratosis 02/21/2017  . Hollenhorst plaque, right eye 08/14/2015   Past Surgical History:  Procedure Laterality Date  . BALLOON DILATION  04/30/2011   Procedure: BALLOON DILATION;  Surgeon: Bernestine Amass, MD;  Location[facility];  Service: Urology;  Laterality: N/A;  . CARDIAC CATHETERIZATION  07-30-2006  DR Martinique   POST CABG / OCCLUDED DIAGONAL &  FOURTH OBTUSE MARGINAL GRAFTS/ NORMAL LVF/ PATENT [place] TO LAD & SEPHENOUS VEIN GRAFT TO 1ST OBTUSE MARGINAL   . CARDIAC CATHETERIZATION  04-23-2004   CRITICAL THREE-VESSEL  CAD/ PRESERVED LVF  . CORONARY ARTERY BYPASS GRAFT  04-25-2004  X4 VESSELS   left inter. mamm to LAD, saph vein to diag; saph vein to distal circ; saph vein to 1st obtuse  . CYSTOSCOPY  04/30/2011   Procedure: CYSTOSCOPY;  Surgeon: Bernestine Amass, MD;  Location: [facility];  Service: Urology;  Laterality: N/A;  30 MINUTES   . DEBRIDEMENT TENNIS ELBOW  2002   RIGHT; [facility]  . LAPAROSCOPIC CHOLECYSTECTOMY  02-05-2005  . ROBOT ASSISTED LAPAROSCOPIC RADICAL PROSTATECTOMY  05-11-2006    Family History  Problem Relation Age of  Onset  . Heart disease Sister        CABGX 1 sister and stents X1 sister  . Cancer Sister   . Parkinsonism Father   . Diabetes Neg Hx   . Stroke Neg Hx     Medications- reviewed and updated Current Outpatient Medications  Medication Sig Dispense Refill  . blood glucose meter kit and supplies Use to check blood sugar 5 times a week. Dx Code E11.65. 1 each 0  . glucose blood (ONETOUCH VERIO) test strip USE TO CHECK BLOOD GLUCOSE 5 TIMES PER WEEK E11.9 100 each 1  . isosorbide mononitrate (IMDUR) 60 MG 24 hr tablet Take 1 tablet (60 mg total) by mouth daily. 90 tablet 3  . Lancets (ONETOUCH ULTRASOFT) lancets Use to test blood sugar 5 times a week E11.65 100 each 4  . latanoprost (XALATAN) 0.005 % ophthalmic solution Place 1 drop into both eyes at bedtime.     [name] losartan (COZAAR) 25 MG tablet Take 1 tablet (25 mg total) by mouth daily. 90 tablet 3  . LUTEIN PO Take 1 tablet by mouth daily.    . metFORMIN (GLUCOPHAGE) 500 MG tablet Take 1 tablet (500 mg total) by mouth 2 (two) times daily with a meal. 180 tablet 3  . metoprolol succinate (TOPROL-XL) 50 MG 24 hr tablet Take 1 tablet (50 mg total) by mouth daily. Take with or immediately following a meal. 90 tablet 3  . Multiple Vitamin (MULTIVITAMIN) tablet Take 1 tablet by mouth daily.    . nitroGLYCERIN (NITROSTAT) 0.4 MG SL tablet Place 1 tablet (0.4 mg total) under the tongue every 5 (five) minutes as needed for chest pain (x 3 doses max). 25 tablet 3  . omeprazole (PRILOSEC) 40 MG capsule TAKE 1 CAPSULE BY MOUTH ONCE DAILY IN THE EVENING 30 capsule 3  . simvastatin (ZOCOR) 20 MG tablet Take 1 tablet (20 mg total) by mouth every evening. 90 tablet 3   No current facility-administered medications for this visit.     Allergies-reviewed and updated Allergies  Allergen Reactions  . Clindamycin Other (See Comments)    Unknown reaction from childhood Other reaction(s): Other (See Comments) Unknown reaction from childhood     Social  History   Social History Narrative   Married. 2 sons. 4 grandkids.       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     Objective: BP 136/78 (BP Location: Left Arm, Patient Position: Sitting, Cuff Size: Large)   Pulse (!) 49   Temp 98.1 F (36.7 C) (Oral)   Ht '5\' 11"'  (1.803 m)   Wt 205 lb 9.6 oz (93.3 kg)   SpO2 98%   BMI 28.68 kg/m  Gen: NAD, resting comfortably HEENT: Mucous membranes are moist. Oropharynx normal Neck: no thyromegaly CV: RRR no  rubs or gallops. 2/6 SEM- echos have been reassuring Lungs: CTAB no crackles, wheeze,  rhonchi Abdomen: soft/nontender/nondistended/normal bowel sounds. No rebound or guarding.  Ext: no edema Skin: warm, dry Neuro: grossly normal, moves all extremities, PERRLA  Assessment/Plan:  83 y.o. male presenting for annual physical.  Health Maintenance counseling: 1. Anticipatory guidance: Patient counseled regarding regular dental exams -q6 months but he is due, eye exams -yearly- apparently getting injections from Dr. Zigmund Daniel for macular degeneration,  avoiding smoking and second hand smoke, limiting alcohol to 2 beverages per day .   2. Risk factor reduction:  Advised patient of need for regular exercise and diet rich and fruits and vegetables to reduce risk of heart attack and stroke. Exercise- not exercising- encouraged to start. Diet-up 10 lbs- feels like he needs to move primarily- diet hasnt changed much- discussed improvements.  Wt Readings from Last 3 Encounters:  12/24/17 205 lb 9.6 oz (93.3 kg)  10/14/17 203 lb 3.2 oz (92.2 kg)  09/17/17 203 lb (92.1 kg)  3. Immunizations/screenings/ancillary studies-discussed Shingrix at pharmacy  Immunization History  Administered Date(s) Administered  . Influenza Whole 10/23/2008, 10/12/2009  . Influenza, High Dose Seasonal PF 11/11/2013, 11/01/2015, 11/03/2016, 09/17/2017  . Influenza-Unspecified 12/02/2012  . Pneumococcal Conjugate-13 11/01/2015  . Pneumococcal Polysaccharide-23  10/18/2007  . Td 03/25/2012  4. Prostate cancer history- PSA in April was very low-followed by [facility].  History of robotic assisted radical prostatectomy in 2008. He is getting psa checked through urology. Continued incontinence.  Lab Results  Component Value Date   PSA <0.015 04/24/2017   5. Colon cancer screening - 01/25/2008 with 7-year follow-up due to prior adenoma-refer to GI to discuss  6. Skin cancer screening- no dermatologist. advised regular sunscreen use. Denies worrisome, changing, or new skin lesions.  We did treat left ear actinic keratosis in July- has cleared- has just hit head- but a few areas I wonder about AKs- may need crotherapy next visit  7.  Former smoker-quit 1960s. No need for lung cancer screening.   Status of chronic or acute concerns   Hypertension-controlled on Imdur 60 mg, losartan 25 mg, Toprol 50 mg and release.  Heart rate slightly low at 49  Hyperlipidemia-has been reasonably controlled on simvastatin 20 mg.  Update lipids today  Hairy cell leukemia- patient continues to follow-up with [place]- finished chemo  End of last year  He is off chemotherapy at present.  Pancytopenia as a result of the leukemia in past-all labs normal except for low platelets on most recent check at [facility] on 12/17/2017. 3 month labs at [facility] planned  GERD- controlled on omeprazole 40 mg- rare breakthrough- particularly coffee  With caffeine- advised try decaf. Also gets some gaseous distension.- discussed could trial gas x or simethicone  CAD- just saw Dr. Martinique 2 months ago and reassuring workup.   Slight murmur detected last visit-patient follows with Dr. Daune Perch obvious cause on echo- I noted this.     No problem-specific Assessment & Plan notes found for this encounter.   Future Appointments  Date Time Provider [place]  01/01/2018  8:00 AM Hayden Pedro, MD [facility] None   No follow-ups on file.  Lab/Order associations: NOT  fasting  No diagnosis found.  No orders of the defined types were placed in this encounter.   Return precautions advised.  Garret Reddish, MD

## 2017-12-24 NOTE — Patient Instructions (Addendum)
Please check with your pharmacy to see if they have the shingrix vaccine. If they do- please get this immunization and update [place] by phone call or mychart with dates you receive the vaccine  Encourage regular exercise as long as no chest pain or shortness of breath  We will call you within two weeks about your referral to GI to discuss possible one more repeat colonoscopy. If you do not hear within 3 weeks, give [place] a call.   Please stop by lab before you go

## 2017-12-29 ENCOUNTER — Telehealth: Payer: Self-pay | Admitting: Family Medicine

## 2017-12-29 ENCOUNTER — Other Ambulatory Visit: Payer: Self-pay

## 2017-12-29 MED ORDER — ATORVASTATIN CALCIUM 20 MG PO TABS: 20.0000 mg | ORAL_TABLET | Freq: Every day | ORAL | 3 refills | Status: DC

## 2017-12-29 NOTE — Telephone Encounter (Signed)
See note  Copied from [place] [phone]. Topic: General - Other >> Dec 29, 2017 11:11 AM Oneta Rack wrote: Relation to pt: self  Call back number: [phone]   Reason for call:  Patient requesting to speak with PCP directly regarding most recent lab results. Patient viewed results thru my chart seeking further clarity, please advise

## 2017-12-30 NOTE — Telephone Encounter (Signed)
Pt requesting to speak to you. See message.

## 2017-12-30 NOTE — Telephone Encounter (Signed)
Spoke with patient and addressed concerns

## 2017-12-31 DIAGNOSIS — H401111 Primary open-angle glaucoma, right eye, mild stage: Secondary | ICD-10-CM | POA: Diagnosis not present

## 2017-12-31 DIAGNOSIS — E119 Type 2 diabetes mellitus without complications: Secondary | ICD-10-CM | POA: Diagnosis not present

## 2017-12-31 DIAGNOSIS — H353212 Exudative age-related macular degeneration, right eye, with inactive choroidal neovascularization: Secondary | ICD-10-CM | POA: Diagnosis not present

## 2018-01-01 ENCOUNTER — Encounter (INDEPENDENT_AMBULATORY_CARE_PROVIDER_SITE_OTHER): Payer: Medicare Other | Admitting: Ophthalmology

## 2018-01-01 DIAGNOSIS — H353122 Nonexudative age-related macular degeneration, left eye, intermediate dry stage: Secondary | ICD-10-CM | POA: Diagnosis not present

## 2018-01-01 DIAGNOSIS — H43813 Vitreous degeneration, bilateral: Secondary | ICD-10-CM

## 2018-01-01 DIAGNOSIS — H35033 Hypertensive retinopathy, bilateral: Secondary | ICD-10-CM | POA: Diagnosis not present

## 2018-01-01 DIAGNOSIS — I10 Essential (primary) hypertension: Secondary | ICD-10-CM

## 2018-01-01 DIAGNOSIS — H353211 Exudative age-related macular degeneration, right eye, with active choroidal neovascularization: Secondary | ICD-10-CM

## 2018-01-08 ENCOUNTER — Encounter: Payer: Self-pay | Admitting: Gastroenterology

## 2018-01-16 ENCOUNTER — Ambulatory Visit (HOSPITAL_COMMUNITY)
Admission: EM | Admit: 2018-01-16 | Discharge: 2018-01-16 | Disposition: A | Discharge status: Home / Self Care | Payer: Medicare Other | Attending: Family Medicine | Admitting: Family Medicine

## 2018-01-16 ENCOUNTER — Encounter (HOSPITAL_COMMUNITY): Payer: Self-pay

## 2018-01-16 ENCOUNTER — Other Ambulatory Visit: Payer: Self-pay

## 2018-01-16 ENCOUNTER — Ambulatory Visit (INDEPENDENT_AMBULATORY_CARE_PROVIDER_SITE_OTHER): Payer: Medicare Other

## 2018-01-16 DIAGNOSIS — Z87891 Personal history of nicotine dependence: Secondary | ICD-10-CM | POA: Insufficient documentation

## 2018-01-16 DIAGNOSIS — R5383 Other fatigue: Secondary | ICD-10-CM | POA: Diagnosis not present

## 2018-01-16 DIAGNOSIS — R69 Illness, unspecified: Secondary | ICD-10-CM | POA: Insufficient documentation

## 2018-01-16 DIAGNOSIS — R05 Cough: Secondary | ICD-10-CM

## 2018-01-16 DIAGNOSIS — R059 Cough, unspecified: Secondary | ICD-10-CM

## 2018-01-16 DIAGNOSIS — J111 Influenza due to unidentified influenza virus with other respiratory manifestations: Secondary | ICD-10-CM

## 2018-01-16 MED ORDER — KETOROLAC TROMETHAMINE 60 MG/2ML IM SOLN: 60.0000 mg | Freq: Once | INTRAMUSCULAR | Status: DC

## 2018-01-16 MED ORDER — BENZONATATE 100 MG PO CAPS: 100.0000 mg | ORAL_CAPSULE | Freq: Three times a day (TID) | ORAL | 0 refills | Status: DC

## 2018-01-16 MED ORDER — AZITHROMYCIN 250 MG PO TABS: 250.0000 mg | ORAL_TABLET | Freq: Every day | ORAL | 0 refills | Status: DC

## 2018-01-16 NOTE — ED Triage Notes (Signed)
Pt cc body aches, sore throat and deep cough x 2 weeks.

## 2018-01-16 NOTE — Discharge Instructions (Addendum)
Be aware, your cough medication may cause drowsiness. Please do not drive, operate heavy machinery or make important decisions while on this medication, it can cloud your judgement.  Follow up with your primary care doctor or here if you are not seeing improvement of your symptoms over the next several days, sooner if you feel you are worsening.  Caring for yourself: Get plenty of rest. Drink plenty of fluids, enough so that your urine is light yellow or clear like water. If you have kidney, heart, or liver disease and have to limit fluids, talk with your doctor before you increase the amount of fluids you drink. Take an over-the-counter pain medicine if needed, such as acetaminophen (Tylenol), ibuprofen (Advil, Motrin), or naproxen (Aleve), to relieve fever, headache, and muscle aches. Read and follow all instructions on the label. No one younger than 20 should take aspirin. It has been linked to Reye syndrome, a serious illness. Before you use over the counter cough and cold medicines, check the label. These medicines may not be safe for children younger than age 6 or for people with certain health problems. If the skin around your nose and lips becomes sore, put some petroleum jelly on the area.  Avoid spreading a virus: Wash your hands regularly, and keep your hands away from your face.  Stay home from school, work, and other public places until you are feeling better and your fever has been gone for at least 24 hours. The fever needs to have gone away on its own without the help of medicine.  

## 2018-01-18 NOTE — ED Provider Notes (Signed)
[place]   [id number] 01/16/18 Arrival Time: 0973  ASSESSMENT & PLAN:  1. Cough   2. Influenza-like illness   3. Former smoker    I have personally viewed the imaging studies ordered this visit. No signs of pneumonia. Discussed. See AVS for d/c instructions. Given duration of cough though, will treat as below. Discussed.  Meds ordered this encounter  Medications  . azithromycin (ZITHROMAX) 250 MG tablet    Sig: Take 1 tablet (250 mg total) by mouth daily. Take first 2 tablets together, then 1 every day until finished.    Dispense:  6 tablet    Refill:  0  . benzonatate (TESSALON) 100 MG capsule    Sig: Take 1 capsule (100 mg total) by mouth every 8 (eight) hours.    Dispense:  21 capsule    Refill:  0   OTC symptom care as needed. Ensure adequate fluid intake and rest. May f/u with PCP or here as needed.  Reviewed expectations re: course of current medical issues. Questions answered. Outlined signs and symptoms indicating need for more acute intervention. Patient verbalized understanding. After Visit Summary given.   SUBJECTIVE: History from: patient.  [name] is a 84 y.o. male who presents with complaint of nasal congestion, post-nasal drainage, and a persistent dry cough; without sore throat. Symptoms have improved except for persistent coughing. Affecting sleep. Onset abrupt, two weeks ago; "maybe three". Overall without fatigue and without body aches but these were present initially. SOB: none. Wheezing: none. Fever: yes, initially but none currently. Overall normal PO intake without n/v. Known sick contacts: no. No specific or significant aggravating or alleviating factors reported. OTC treatment: cough medications without relief.  Received flu shot this year: yes.  Social History   Tobacco Use  Smoking Status Former Smoker  . Packs/day: 0.25  . Years: 3.00  . Pack years: 0.75  . Types: Cigarettes, Cigars  . Last attempt to quit: 01/14/1963   . Years since quitting: 55.0  Smokeless Tobacco Never Used  Tobacco Comment   QUIT SMOKING CIGARETTES 1964 AND QUIT OCCASIONAL CIGAR  2007    ROS: As per HPI. All other systems negative.    OBJECTIVE:  Vitals:   01/16/18 1305 01/16/18 1308  BP: (!) 177/85   Pulse: 64   Resp: 18   Temp: 98.3 F (36.8 C)   TempSrc: Oral   SpO2: 98%   Weight:  93 kg     General appearance: alert; appears fatigued HEENT: mild nasal congestion; throat irritation secondary to post-nasal drainage Neck: supple without LAD CV: RRR Lungs: unlabored respirations, symmetrical air entry without wheezing; cough: moderate Abd: soft; non-tender Back: no tenderness over mid to upper back Ext: no LE edema Skin: warm and dry Psychological: alert and cooperative; normal mood and affect  Imaging: Dg Chest 2 View  Result Date: 01/16/2018 CLINICAL DATA:  84 y/o  M; 2 weeks of cough and fatigue. EXAM: CHEST - 2 VIEW COMPARISON:  10/08/2016 chest radiograph FINDINGS: Stable cardiomegaly given projection and technique. Post CABG with sternotomy wires in alignment. Bronchitic changes in the lung bases. No consolidation, effusion, or pneumothorax. No acute osseous abnormality is evident. IMPRESSION: Stable cardiomegaly. Bronchitic changes in the lung bases. No focal consolidation. Electronically Signed   By: Kristine Garbe M.D.   On: 01/16/2018 13:50    Allergies  Allergen Reactions  . Clindamycin Other (See Comments)    Unknown reaction from childhood Other reaction(s): Other (See Comments) Unknown reaction from childhood  Past Medical History:  Diagnosis Date  . Arthritis   . Bladder neck contracture   . Cataract immature BILATERAL  . Coronary artery disease CARDIOLOGIST- DR Martinique-  LAST VISIT NOTE 12-20-2010 IN EPIC AND W/ CHART   subendocranial MI; left inter. mamm to LAD, saph vein to diag; saph vein to distal circ; saph vein to 1st obtuse  . Diabetes mellitus without complication  ([place])   . DIVERTICULOSIS, COLON 09/05/2003   Qualifier: Diagnosis of  By: Nolon Rod CMA (AAMA), Robin    . EUSTACHIAN TUBE DYSFUNCTION, BILATERAL 06/22/2008   Qualifier: Diagnosis of  By: Linna Darner MD, William    . EXTERNAL HEMORRHOIDS 12/14/2007   Qualifier: Diagnosis of  By: Nolon Rod CMA (AAMA), Robin    . Frequency of urination   . GERD (gastroesophageal reflux disease)   . Glaucoma BILATERAL  . H/O hiatal hernia   . Hairy cell leukemia ([place])   . History of atherosclerotic cardiovascular disease   . History of kidney stones   . History of myocardial infarction 2006   S/P CABG  . History of prostate cancer S/P PROSTATECTOMY-  NO RECURRENCE   FOLLOWED BY DR Risa Grill  . Hyperlipidemia   . Hypertension   . Hypertriglyceridemia   . Insomnia   . Macular degeneration   . Nocturia   . Raynaud's phenomenon   . Right arm cellulitis 04/02/12   post cat bite  . S/P CABG x 4 2006  . Stable angina ([facility])   . Urgency of urination    Family History  Problem Relation Age of Onset  . Heart disease Sister        CABGX 1 sister and stents X1 sister  . Cancer Sister   . Parkinsonism Father   . Diabetes Neg Hx   . Stroke Neg Hx    Social History   Socioeconomic History  . Marital status: Married    Spouse name: Not on file  . Number of children: 2  . Years of education: Not on file  . Highest education level: Not on file  Occupational History  . Occupation: MANUFACTURER'S REP    Employer: [employer]  . Financial resource strain: Not on file  . Food insecurity:    Worry: Not on file    Inability: Not on file  . Transportation needs:    Medical: Not on file    Non-medical: Not on file  Tobacco Use  . Smoking status: Former Smoker    Packs/day: 0.25    Years: 3.00    Pack years: 0.75    Types: Cigarettes, Cigars    Last attempt to quit: 01/14/1963    Years since quitting: 55.0  . Smokeless tobacco: Never Used  . Tobacco comment: [place] AND QUIT OCCASIONAL CIGAR  2007  Substance and Sexual Activity  . Alcohol use: Yes    Alcohol/week: 14.0 standard drinks    Types: 7 Glasses of wine, 7 Shots of liquor per week    Comment: has dinner drink shot and 2 glasses of wine   . Drug use: No  . Sexual activity: Not on file  Lifestyle  . Physical activity:    Days per week: Not on file    Minutes per session: Not on file  . Stress: Not on file  Relationships  . Social connections:    Talks on phone: Not on file    Gets together: Not on file    Attends religious service: Not  on file    Active member of club or organization: Not on file    Attends meetings of clubs or organizations: Not on file    Relationship status: Not on file  . Intimate partner violence:    Fear of current or ex partner: Not on file    Emotionally abused: Not on file    Physically abused: Not on file    Forced sexual activity: Not on file  Other Topics Concern  . Not on file  Social History Narrative   Married. 2 sons. 4 grandkids.       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            Vanessa Kick, MD 01/18/18 [phone]

## 2018-01-22 ENCOUNTER — Encounter: Payer: Self-pay | Admitting: Physician Assistant

## 2018-01-22 ENCOUNTER — Ambulatory Visit: Payer: Self-pay | Admitting: *Deleted

## 2018-01-22 ENCOUNTER — Ambulatory Visit (INDEPENDENT_AMBULATORY_CARE_PROVIDER_SITE_OTHER): Payer: Medicare Other | Admitting: Physician Assistant

## 2018-01-22 VITALS — BP 130/86 | HR 62 | Temp 97.7°F | Ht 71.0 in | Wt 202.5 lb

## 2018-01-22 DIAGNOSIS — R05 Cough: Secondary | ICD-10-CM

## 2018-01-22 DIAGNOSIS — R059 Cough, unspecified: Secondary | ICD-10-CM

## 2018-01-22 MED ORDER — HYDROCODONE-HOMATROPINE 5-1.5 MG/5ML PO SYRP: 5.0000 mL | ORAL_SOLUTION | Freq: Three times a day (TID) | ORAL | 0 refills | Status: DC | PRN

## 2018-01-22 MED ORDER — IPRATROPIUM BROMIDE 0.03 % NA SOLN: 2.0000 | Freq: Two times a day (BID) | NASAL | 12 refills | Status: DC

## 2018-01-22 NOTE — Progress Notes (Signed)
[name] is a 84 y.o. male here for a follow up of a pre-existing problem.  I acted as a Education administrator for [employer], PA-C Anselmo Pickler, LPN  History of Present Illness:   Chief Complaint  Patient presents with  . Cough    Cough  This is a recurrent problem. Episode onset: Pt was seen at Urgent Care on 01/16/2018.  The problem has been gradually improving. The problem occurs every few hours. The cough is productive of sputum (expectorating white sputum). Associated symptoms include chills, ear pain (Left), nasal congestion and postnasal drip. Pertinent negatives include no fever, headaches, sore throat or shortness of breath. Associated symptoms comments: Coughing spells Bronchospasms.. The symptoms are aggravated by lying down. He has tried prescription cough suppressant (Finished Z-pak yesterday) for the symptoms. The treatment provided no relief. His past medical history is significant for bronchitis and environmental allergies. There is no history of asthma or pneumonia.     Past Medical History:  Diagnosis Date  . Arthritis   . Bladder neck contracture   . Cataract immature BILATERAL  . Coronary artery disease CARDIOLOGIST- DR [place]-  LAST VISIT NOTE 12-20-2010 IN EPIC AND W/ CHART   subendocranial MI; left inter. mamm to LAD, saph vein to diag; saph vein to distal circ; saph vein to 1st obtuse  . Diabetes mellitus without complication ([place])   . DIVERTICULOSIS, COLON 09/05/2003   Qualifier: Diagnosis of  By: Nolon Rod CMA (AAMA), Robin    . EUSTACHIAN TUBE DYSFUNCTION, BILATERAL 06/22/2008   Qualifier: Diagnosis of  By: Linna Darner MD, William    . EXTERNAL HEMORRHOIDS 12/14/2007   Qualifier: Diagnosis of  By: Nolon Rod CMA (AAMA), Robin    . Frequency of urination   . GERD (gastroesophageal reflux disease)   . Glaucoma BILATERAL  . H/O hiatal hernia   . Hairy cell leukemia ([place])   . History of atherosclerotic cardiovascular disease   . History of kidney stones   . History  of myocardial infarction 2006   S/P CABG  . History of prostate cancer S/P PROSTATECTOMY-  NO RECURRENCE   FOLLOWED BY DR Risa Grill  . Hyperlipidemia   . Hypertension   . Hypertriglyceridemia   . Insomnia   . Macular degeneration   . Nocturia   . Raynaud's phenomenon   . Right arm cellulitis 04/02/12   post cat bite  . S/P CABG x 4 2006  . Stable angina ([facility])   . Urgency of urination      Social History   Socioeconomic History  . Marital status: Married    Spouse name: Not on file  . Number of children: 2  . Years of education: Not on file  . Highest education level: Not on file  Occupational History  . Occupation: MANUFACTURER'S REP    Employer: [employer]  . Financial resource strain: Not on file  . Food insecurity:    Worry: Not on file    Inability: Not on file  . Transportation needs:    Medical: Not on file    Non-medical: Not on file  Tobacco Use  . Smoking status: Former Smoker    Packs/day: 0.25    Years: 3.00    Pack years: 0.75    Types: Cigarettes, Cigars    Last attempt to quit: 01/14/1963    Years since quitting: 55.0  . Smokeless tobacco: Never Used  . Tobacco comment: [place] AND QUIT OCCASIONAL CIGAR  2007  Substance  and Sexual Activity  . Alcohol use: Yes    Alcohol/week: 14.0 standard drinks    Types: 7 Glasses of wine, 7 Shots of liquor per week    Comment: has dinner drink shot and 2 glasses of wine   . Drug use: No  . Sexual activity: Not on file  Lifestyle  . Physical activity:    Days per week: Not on file    Minutes per session: Not on file  . Stress: Not on file  Relationships  . Social connections:    Talks on phone: Not on file    Gets together: Not on file    Attends religious service: Not on file    Active member of club or organization: Not on file    Attends meetings of clubs or organizations: Not on file    Relationship status: Not on file  . Intimate partner violence:    Fear  of current or ex partner: Not on file    Emotionally abused: Not on file    Physically abused: Not on file    Forced sexual activity: Not on file  Other Topics Concern  . Not on file  Social History Narrative   Married. 2 sons. 4 grandkids.       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     Past Surgical History:  Procedure Laterality Date  . BALLOON DILATION  04/30/2011   Procedure: BALLOON DILATION;  Surgeon: Bernestine Amass, MD;  Location: [facility];  Service: Urology;  Laterality: N/A;  . CARDIAC CATHETERIZATION  07-30-2006  DR [place]   POST CABG / OCCLUDED DIAGONAL &  FOURTH OBTUSE MARGINAL GRAFTS/ NORMAL LVF/ PATENT [place] TO LAD & SEPHENOUS VEIN GRAFT TO 1ST OBTUSE MARGINAL   . CARDIAC CATHETERIZATION  04-23-2004   CRITICAL THREE-VESSEL CAD/ PRESERVED LVF  . CORONARY ARTERY BYPASS GRAFT  04-25-2004  X4 VESSELS   left inter. mamm to LAD, saph vein to diag; saph vein to distal circ; saph vein to 1st obtuse  . CYSTOSCOPY  04/30/2011   Procedure: CYSTOSCOPY;  Surgeon: Bernestine Amass, MD;  Location: [facility];  Service: Urology;  Laterality: N/A;  30 MINUTES   . DEBRIDEMENT TENNIS ELBOW  2002   RIGHT; [facility]  . LAPAROSCOPIC CHOLECYSTECTOMY  02-05-2005  . ROBOT ASSISTED LAPAROSCOPIC RADICAL PROSTATECTOMY  05-11-2006    Family History  Problem Relation Age of Onset  . Heart disease Sister        CABGX 1 sister and stents X1 sister  . Cancer Sister   . Parkinsonism Father   . Diabetes Neg Hx   . Stroke Neg Hx     Allergies  Allergen Reactions  . Clindamycin Other (See Comments)    Unknown reaction from childhood Other reaction(s): Other (See Comments) Unknown reaction from childhood     Current Medications:   Current Outpatient Medications:  .  atorvastatin (LIPITOR) 20 MG tablet, Take 1 tablet (20 mg total) by mouth daily., Disp: 90 tablet, Rfl: 3 .  azithromycin (ZITHROMAX) 250 MG tablet, Take 1 tablet (250 mg total) by  mouth daily. Take first 2 tablets together, then 1 every day until finished., Disp: 6 tablet, Rfl: 0 .  benzonatate (TESSALON) 100 MG capsule, Take 1 capsule (100 mg total) by mouth every 8 (eight) hours., Disp: 21 capsule, Rfl: 0 .  blood glucose meter kit and supplies, Use to check blood sugar 5 times a week. Dx Code E11.65., Disp: 1 each,  Rfl: 0 .  glucose blood (ONETOUCH VERIO) test strip, USE TO CHECK BLOOD GLUCOSE 5 TIMES PER WEEK E11.9, Disp: 100 each, Rfl: 1 .  isosorbide mononitrate (IMDUR) 60 MG 24 hr tablet, Take 1 tablet (60 mg total) by mouth daily., Disp: 90 tablet, Rfl: 3 .  Lancets (ONETOUCH ULTRASOFT) lancets, Use to test blood sugar 5 times a week E11.65, Disp: 100 each, Rfl: 4 .  latanoprost (XALATAN) 0.005 % ophthalmic solution, Place 1 drop into both eyes at bedtime. , Disp: , Rfl:  .  losartan (COZAAR) 25 MG tablet, Take 1 tablet (25 mg total) by mouth daily., Disp: 90 tablet, Rfl: 3 .  LUTEIN PO, Take 1 tablet by mouth daily., Disp: , Rfl:  .  metFORMIN (GLUCOPHAGE) 500 MG tablet, Take 1 tablet (500 mg total) by mouth 2 (two) times daily with a meal., Disp: 180 tablet, Rfl: 3 .  metoprolol succinate (TOPROL-XL) 50 MG 24 hr tablet, Take 1 tablet (50 mg total) by mouth daily. Take with or immediately following a meal., Disp: 90 tablet, Rfl: 3 .  Multiple Vitamin (MULTIVITAMIN) tablet, Take 1 tablet by mouth daily., Disp: , Rfl:  .  nitroGLYCERIN (NITROSTAT) 0.4 MG SL tablet, Place 1 tablet (0.4 mg total) under the tongue every 5 (five) minutes as needed for chest pain (x 3 doses max)., Disp: 25 tablet, Rfl: 3 .  omeprazole (PRILOSEC) 40 MG capsule, Take 1 capsule (40 mg total) by mouth daily., Disp: 90 capsule, Rfl: 3 .  simvastatin (ZOCOR) 20 MG tablet, Take 1 tablet (20 mg total) by mouth every evening., Disp: 90 tablet, Rfl: 3 .  HYDROcodone-homatropine (HYCODAN) 5-1.5 MG/5ML syrup, Take 5 mLs by mouth every 8 (eight) hours as needed for cough., Disp: 120 mL, Rfl: 0 .   ipratropium (ATROVENT) 0.03 % nasal spray, Place 2 sprays into both nostrils every 12 (twelve) hours., Disp: 30 mL, Rfl: 12   Review of Systems:   Review of Systems  Constitutional: Positive for chills. Negative for fever.  HENT: Positive for ear pain (Left) and postnasal drip. Negative for sore throat.   Respiratory: Positive for cough. Negative for shortness of breath.   Neurological: Negative for headaches.  Endo/Heme/Allergies: Positive for environmental allergies.    Vitals:   Vitals:   01/22/18 1548  BP: 130/86  Pulse: 62  Temp: 97.7 F (36.5 C)  TempSrc: Oral  SpO2: 95%  Weight: 202 lb 8 oz (91.9 kg)  Height: '5\' 11"'  (1.803 m)     Body mass index is 28.24 kg/m.  Physical Exam:   Physical Exam Vitals signs and nursing note reviewed.  Constitutional:      General: He is not in acute distress.    Appearance: He is well-developed. He is not ill-appearing or toxic-appearing.  HENT:     Head: Normocephalic and atraumatic.     Right Ear: Tympanic membrane, ear canal and external ear normal. Tympanic membrane is not erythematous, retracted or bulging.     Left Ear: Tympanic membrane, ear canal and external ear normal. Tympanic membrane is not erythematous, retracted or bulging.     Nose: Mucosal edema, congestion and rhinorrhea present.     Right Sinus: No maxillary sinus tenderness or frontal sinus tenderness.     Left Sinus: No maxillary sinus tenderness or frontal sinus tenderness.     Mouth/Throat:     Lips: Pink.     Mouth: Mucous membranes are moist.     Pharynx: Uvula midline. Posterior oropharyngeal erythema present.  Tonsils: Swelling: 0 on the right. 0 on the left.  Eyes:     General: Lids are normal.     Conjunctiva/sclera: Conjunctivae normal.  Neck:     Trachea: Trachea normal.  Cardiovascular:     Rate and Rhythm: Normal rate and regular rhythm.     Pulses: Normal pulses.     Heart sounds: Normal heart sounds, S1 normal and S2 normal.      Comments: No LE edema Pulmonary:     Effort: Pulmonary effort is normal.     Breath sounds: Normal breath sounds. No decreased breath sounds, wheezing, rhonchi or rales.  Lymphadenopathy:     Cervical: No cervical adenopathy.  Skin:    General: Skin is warm and dry.  Neurological:     Mental Status: He is alert.     GCS: GCS eye subscore is 4. GCS verbal subscore is 5. GCS motor subscore is 6.  Psychiatric:        Speech: Speech normal.        Behavior: Behavior normal. Behavior is cooperative.        Assessment and Plan:   [name] was seen today for cough.  Diagnoses and all orders for this visit:  Cough No red flags on exam.  Will initiate hycodan cough syrup at night (drowsy precautions discussed), delsym during the day, and atrovent nasal spray per orders. Discussed taking medications as prescribed. Reviewed return precautions including worsening fever, SOB, worsening cough or other concerns. Push fluids and rest. I recommend that patient follow-up if symptoms worsen or persist despite treatment x 7-10 days, sooner if needed.  Other orders -     HYDROcodone-homatropine (HYCODAN) 5-1.5 MG/5ML syrup; Take 5 mLs by mouth every 8 (eight) hours as needed for cough. -     ipratropium (ATROVENT) 0.03 % nasal spray; Place 2 sprays into both nostrils every 12 (twelve) hours.  . Reviewed expectations re: course of current medical issues. . Discussed self-management of symptoms. . Outlined signs and symptoms indicating need for more acute intervention. . Patient verbalized understanding and all questions were answered. . See orders for this visit as documented in the electronic medical record. . Patient received an After-Visit Summary.  CMA or LPN served as scribe during this visit. History, Physical, and Plan performed by medical provider. The above documentation has been reviewed and is accurate and complete.  Inda Coke, PA-C

## 2018-01-22 NOTE — Telephone Encounter (Signed)
Patient called and says "I went to the UC on Saturday, received an antibiotic and pill for the cough, benzonate, and it has done nothing. I can handle the cough, but it goes into a spasm and I can't stop it. It happens more at night. In the past I took this cough syrup that knocked it out. I would like to get that, if I have to come in, I'll do that." I asked about the color of the sputum coming up, he says "it was green, but now it's white." I advised no opening with [name] Channel, but I could schedule today with Inda Coke, PA. He says "can't the cough medicine be called in? I [facility]." I advised that he was not seen in the office and different providers will do different treatments. He verbalized understanding and accepted the appointment today at 1540 with [facility].  Answer Assessment - Initial Assessment Questions 1. MAIN CONCERN OR SYMPTOM:  "What is your main concern right now?" "What question do you have?" "What's the main symptom you're worried about?" (e.g., breathing difficulty, cough, fever. pain)     Coughing 2. ONSET: "When did the cough start?"    N/A 3. BETTER-SAME-WORSE: "Are you getting better, staying the same, or getting worse compared to how you felt at your last visit to the doctor (most recent medical visit)"?     N/A 4. VISIT DATE: "When were you seen?" (Date)     [name] on 01/16/18 5. VISIT DOCTOR: "What is the name of the doctor taking care of you now?"     N/A 6. VISIT DIAGNOSIS:  "What was the main symptom or problem that you were seen for?" "Were you given a diagnosis?"      N/A 7. VISIT MEDICATIONS: "Did the physician order any new medicines for you to use?" If yes: "Have you filled the prescription and started taking the medicine?"      Antibiotic and cough pill, benzonate. 8. NEXT APPOINTMENT: "Have you scheduled a follow-up appointment with your doctor?"     No 9. PAIN: "Is there any pain?" If so, ask: "How bad is it?"  (Scale 1-10; or mild,  moderate, severe)     N/A 10. FEVER: "Do you have a fever?" If so, ask: "What is it, how was it measured  and when did it start?"      N/A 11. OTHER SYMPTOMS: "Do you have any other symptoms?"       N/A  Protocols used: RECENT MEDICAL VISIT FOR ILLNESS FOLLOW-UP CALL-A-AH

## 2018-01-22 NOTE — Patient Instructions (Addendum)
It was great to see you!  Use medication as prescribed: nasal spray and hycodan cough syrup  May use the below cough syrup, during the day:    Push fluids and get plenty of rest. Please return if you are not improving as expected, or if you have high fevers (>101.5) or difficulty swallowing or worsening productive cough.  Call clinic with questions.  I hope you start feeling better soon!

## 2018-01-22 NOTE — Telephone Encounter (Signed)
Summary: cough   Pt states he was seen at UC this weekend for a cough and the medicine they prescribed is not helping him and he would like to discuss with a nurse. He states that he would like a cough syrup called in if possible.

## 2018-01-29 ENCOUNTER — Encounter (INDEPENDENT_AMBULATORY_CARE_PROVIDER_SITE_OTHER): Payer: Medicare Other | Admitting: Ophthalmology

## 2018-02-01 ENCOUNTER — Encounter (INDEPENDENT_AMBULATORY_CARE_PROVIDER_SITE_OTHER): Payer: Medicare Other | Admitting: Ophthalmology

## 2018-02-01 DIAGNOSIS — I10 Essential (primary) hypertension: Secondary | ICD-10-CM

## 2018-02-01 DIAGNOSIS — H353211 Exudative age-related macular degeneration, right eye, with active choroidal neovascularization: Secondary | ICD-10-CM | POA: Diagnosis not present

## 2018-02-01 DIAGNOSIS — H43813 Vitreous degeneration, bilateral: Secondary | ICD-10-CM

## 2018-02-01 DIAGNOSIS — H35033 Hypertensive retinopathy, bilateral: Secondary | ICD-10-CM | POA: Diagnosis not present

## 2018-02-01 DIAGNOSIS — H353122 Nonexudative age-related macular degeneration, left eye, intermediate dry stage: Secondary | ICD-10-CM

## 2018-02-02 DIAGNOSIS — H401111 Primary open-angle glaucoma, right eye, mild stage: Secondary | ICD-10-CM | POA: Diagnosis not present

## 2018-02-02 DIAGNOSIS — H353212 Exudative age-related macular degeneration, right eye, with inactive choroidal neovascularization: Secondary | ICD-10-CM | POA: Diagnosis not present

## 2018-02-02 DIAGNOSIS — E119 Type 2 diabetes mellitus without complications: Secondary | ICD-10-CM | POA: Diagnosis not present

## 2018-02-03 ENCOUNTER — Ambulatory Visit: Payer: Medicare Other | Admitting: Gastroenterology

## 2018-02-03 ENCOUNTER — Encounter: Payer: Self-pay | Admitting: Gastroenterology

## 2018-02-03 VITALS — BP 134/70 | HR 64 | Ht 69.75 in | Wt 201.1 lb

## 2018-02-03 DIAGNOSIS — Z1211 Encounter for screening for malignant neoplasm of colon: Secondary | ICD-10-CM | POA: Diagnosis not present

## 2018-02-03 DIAGNOSIS — R140 Abdominal distension (gaseous): Secondary | ICD-10-CM | POA: Diagnosis not present

## 2018-02-03 DIAGNOSIS — K529 Noninfective gastroenteritis and colitis, unspecified: Secondary | ICD-10-CM | POA: Diagnosis not present

## 2018-02-03 NOTE — Progress Notes (Signed)
[name] Consult Note:  History: [name] 02/03/2018  Referring physician: Marin Olp, MD  Reason for consult/chief complaint: hx of colon polyps; Diarrhea; Gas; and dyspepsia   Subjective  HPI:  [name] was referred by primary care to discuss just his symptoms and possible colorectal cancer screening.  He had 2 subcentimeter polyps removed in 2005, and a 2010 had sigmoid diverticulosis but no polyps. He has been bothered by bloating and intermittent gas for years.  He has some intermittent diarrhea that he attributes to starting metformin.  His appetite is good and his weight stable.  He denies rectal bleeding, nausea vomiting or dysphagia.  ROS:  Review of Systems  Constitutional: Negative for appetite change and unexpected weight change.  HENT: Negative for mouth sores and voice change.   Eyes: Negative for pain and redness.  Respiratory: Negative for cough and shortness of breath.   Cardiovascular: Negative for chest pain and palpitations.  Genitourinary: Positive for enuresis. Negative for dysuria and hematuria.  Musculoskeletal: Positive for arthralgias and back pain. Negative for myalgias.  Skin: Negative for pallor and rash.  Allergic/Immunologic: Positive for environmental allergies.  Neurological: Negative for weakness and headaches.  Hematological: Negative for adenopathy.  Psychiatric/Behavioral:       Anxiety     Past Medical History: Past Medical History:  Diagnosis Date  . Adenomatous colon polyp   . Anxiety   . Arthritis   . Bladder neck contracture   . Cataract immature BILATERAL  . Coronary artery disease CARDIOLOGIST- Derek [name]-  LAST VISIT NOTE 12-20-2010 IN EPIC AND W/ CHART   subendocranial MI; left inter. mamm to LAD, saph vein to diag; saph vein to distal circ; saph vein to 1st obtuse  . Diabetes mellitus without complication ([name])   . DIVERTICULOSIS, COLON 09/05/2003   Qualifier: Diagnosis of  By:  [name] CMA (AAMA), [name]    . EUSTACHIAN TUBE DYSFUNCTION, BILATERAL 06/22/2008   Qualifier: Diagnosis of  By: Derek Darner MD, [name]    . EXTERNAL HEMORRHOIDS 12/14/2007   Qualifier: Diagnosis of  By: [name] CMA (AAMA), [name]    . Frequency of urination   . GERD (gastroesophageal reflux disease)   . Glaucoma BILATERAL  . H/O hiatal hernia   . Hairy cell leukemia ([name])   . History of atherosclerotic cardiovascular disease   . History of kidney stones   . History of myocardial infarction 2006   S/P CABG  . History of prostate cancer S/P PROSTATECTOMY-  NO RECURRENCE   FOLLOWED BY [name]  . Hyperlipidemia   . Hypertension   . Hypertriglyceridemia   . Insomnia   . Macular degeneration   . Nocturia   . Raynaud's phenomenon   . Right arm cellulitis 04/02/12   post cat bite  . S/P CABG x 4 2006  . Stable angina ([facility])   . Urgency of urination      Past Surgical History: Past Surgical History:  Procedure Laterality Date  . BALLOON DILATION  04/30/2011   Procedure: BALLOON DILATION;  Surgeon: Derek Amass, MD;  Location: [facility];  Service: Urology;  Laterality: N/A;  . CARDIAC CATHETERIZATION  07-30-2006  Derek [name]   POST CABG / OCCLUDED DIAGONAL &  FOURTH OBTUSE MARGINAL GRAFTS/ NORMAL LVF/ PATENT [place] TO LAD & SEPHENOUS VEIN GRAFT TO 1ST OBTUSE MARGINAL   . CARDIAC CATHETERIZATION  04-23-2004   CRITICAL THREE-VESSEL CAD/ PRESERVED LVF  . CORONARY ARTERY BYPASS GRAFT  04-25-2004  X4 VESSELS  left inter. mamm to LAD, saph vein to diag; saph vein to distal circ; saph vein to 1st obtuse  . CYSTOSCOPY  04/30/2011   Procedure: CYSTOSCOPY;  Surgeon: Derek Amass, MD;  Location: [facility];  Service: Urology;  Laterality: N/A;  30 MINUTES   . DEBRIDEMENT TENNIS ELBOW  2002   RIGHT; [facility]  . LAPAROSCOPIC CHOLECYSTECTOMY  02-05-2005  . ROBOT ASSISTED LAPAROSCOPIC RADICAL PROSTATECTOMY  05-11-2006     Family History: Family History    Problem Relation Age of Onset  . Heart disease Sister        CABGX 1 sister and stents X1 sister  . Cancer Sister   . Parkinsonism Father   . Diabetes Neg Hx   . Stroke Neg Hx     Social History: Social History   Socioeconomic History  . Marital status: Married    Spouse name: Not on file  . Number of children: 2  . Years of education: Not on file  . Highest education level: Not on file  Occupational History  . Occupation: MANUFACTURER'S REP    Employer: [employer]  . Financial resource strain: Not on file  . Food insecurity:    Worry: Not on file    Inability: Not on file  . Transportation needs:    Medical: Not on file    Non-medical: Not on file  Tobacco Use  . Smoking status: Former Smoker    Packs/day: 0.25    Years: 3.00    Pack years: 0.75    Types: Cigarettes, Cigars    Last attempt to quit: 01/14/1963    Years since quitting: 55.0  . Smokeless tobacco: Never Used  . Tobacco comment: [place] AND QUIT OCCASIONAL CIGAR  2007  Substance and Sexual Activity  . Alcohol use: Yes    Alcohol/week: 14.0 standard drinks    Types: 7 Glasses of wine, 7 Shots of liquor per week    Comment: has dinner drink shot and 2 glasses of wine   . Drug use: No  . Sexual activity: Not on file  Lifestyle  . Physical activity:    Days per week: Not on file    Minutes per session: Not on file  . Stress: Not on file  Relationships  . Social connections:    Talks on phone: Not on file    Gets together: Not on file    Attends religious service: Not on file    Active member of club or organization: Not on file    Attends meetings of clubs or organizations: Not on file    Relationship status: Not on file  Other Topics Concern  . Not on file  Social History Narrative   Married. 2 sons. 4 grandkids.       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     Allergies: Allergies  Allergen Reactions  . Clindamycin Other (See  Comments)    Unknown reaction from childhood Other reaction(s): Other (See Comments) Unknown reaction from childhood     Outpatient Meds: Current Outpatient Medications  Medication Sig Dispense Refill  . atorvastatin (LIPITOR) 20 MG tablet Take 1 tablet (20 mg total) by mouth daily. 90 tablet 3  . blood glucose meter kit and supplies Use to check blood sugar 5 times a week. Dx Code E11.65. 1 each 0  . glucose blood (ONETOUCH VERIO) test strip USE TO CHECK BLOOD GLUCOSE 5 TIMES PER WEEK E11.9  100 each 1  . ipratropium (ATROVENT) 0.03 % nasal spray Place 2 sprays into both nostrils every 12 (twelve) hours. 30 mL 12  . isosorbide mononitrate (IMDUR) 60 MG 24 hr tablet Take 1 tablet (60 mg total) by mouth daily. 90 tablet 3  . Lancets (ONETOUCH ULTRASOFT) lancets Use to test blood sugar 5 times a week E11.65 100 each 4  . latanoprost (XALATAN) 0.005 % ophthalmic solution Place 1 drop into both eyes at bedtime.     [name] losartan (COZAAR) 25 MG tablet Take 1 tablet (25 mg total) by mouth daily. 90 tablet 3  . LUTEIN PO Take 1 tablet by mouth daily.    . metFORMIN (GLUCOPHAGE) 500 MG tablet Take 1 tablet (500 mg total) by mouth 2 (two) times daily with a meal. 180 tablet 3  . metoprolol succinate (TOPROL-XL) 50 MG 24 hr tablet Take 1 tablet (50 mg total) by mouth daily. Take with or immediately following a meal. 90 tablet 3  . Multiple Vitamin (MULTIVITAMIN) tablet Take 1 tablet by mouth daily.    . nitroGLYCERIN (NITROSTAT) 0.4 MG SL tablet Place 1 tablet (0.4 mg total) under the tongue every 5 (five) minutes as needed for chest pain (x 3 doses max). 25 tablet 3  . omeprazole (PRILOSEC) 40 MG capsule Take 1 capsule (40 mg total) by mouth daily. 90 capsule 3  . simvastatin (ZOCOR) 20 MG tablet Take 1 tablet (20 mg total) by mouth every evening. 90 tablet 3   No current facility-administered medications for this visit.        ___________________________________________________________________ Objective   Exam:  BP 134/70 (BP Location: Left Arm, Patient Position: Sitting, Cuff Size: Normal)   Pulse 64   Ht 5' 9.75" (1.772 m) Comment: height measured without shoes  Wt 201 lb 2 oz (91.2 kg)   BMI 29.07 kg/m    General: this is a(n) well-appearing man  Eyes: sclera anicteric, no redness  ENT: oral mucosa moist without lesions, no cervical or supraclavicular lymphadenopathy  CV: RRR without murmur, S1/S2, no JVD, no peripheral edema  Resp: clear to auscultation bilaterally, normal RR and effort noted  GI: soft, no tenderness, with active bowel sounds. No guarding or palpable organomegaly noted.  Skin; warm and dry, no rash or jaundice noted  Neuro: awake, alert and oriented x 3. Normal gross motor function and fluent speech  Labs: Prior colonoscopy reports as noted above.  There is no pathology report available on the 2005 colon polyps.  Assessment: Encounter Diagnoses  Name Primary?  . Abdominal bloating Yes  . Special screening for malignant neoplasms, colon   . Chronic diarrhea    His abdominal bloating and diarrhea seem likely from metformin and possibly some dietary triggers. He is past colorectal cancer screening age.  Plan:  Some written dietary advice was given.  He does not need colon cancer screening and can see me as needed.  Thank you for the courtesy of this consult.  Please call me with any questions or concerns.  Nelida Meuse III  CC: Referring provider noted above

## 2018-02-03 NOTE — Patient Instructions (Addendum)
If you are age 82 or older, your body mass index should be between 23-30. Your Body mass index is 29.07 kg/m. If this is out of the aforementioned range listed, please consider follow up with your Primary Care Provider.  If you are age 44 or younger, your body mass index should be between 19-25. Your Body mass index is 29.07 kg/m. If this is out of the aformentioned range listed, please consider follow up with your Primary Care Provider.   It was a pleasure to see you today!  Dr. Loletha Carrow    Food Guidelines for gas and bloating  Many people have difficulty digesting certain foods, causing a variety of distressing and embarrassing symptoms such as abdominal pain, bloating and gas.  These foods may need to be avoided or consumed in small amounts.  Here are some tips that might be helpful for you.  1.   Lactose intolerance is the difficulty or complete inability to digest lactose, the natural sugar in milk and anything made from milk.  This condition is harmless, common, and can begin any time during life.  Some people can digest a modest amount of lactose while others cannot tolerate any.  Also, not all dairy products contain equal amounts of lactose.  For example, hard cheeses such as parmesan have less lactose than soft cheeses such as cheddar.  Yogurt has less lactose than milk or cheese.  Many packaged foods (even many brands of bread) have milk, so read ingredient lists carefully.  It is difficult to test for lactose intolerance, so just try avoiding lactose as much as possible for a week and see what happens with your symptoms.  If you seem to be lactose intolerant, the best plan is to avoid it (but make sure you get calcium from another source).  The next best thing is to use lactase enzyme supplements, available over the counter everywhere.  Just know that many lactose intolerant people need to take several tablets with each serving of dairy to avoid symptoms.  Lastly, a lot of restaurant food is  made with milk or butter.  Many are things you might not suspect, such as mashed potatoes, rice and pasta (cooked with butter) and "grilled" items.  If you are lactose intolerant, it never hurts to ask your server what has milk or butter.  2.   Fiber is an important part of your diet, but not all fiber is well-tolerated.  Insoluble fiber such as bran is often consumed by normal gut bacteria and converted into gas.  Soluble fiber such as oats, squash, carrots and green beans are typically tolerated better.  3.   Some types of carbohydrates can be poorly digested.  Examples include: fructose (apples, cherries, pears, raisins and other dried fruits), fructans (onions, zucchini, large amounts of wheat), sorbitol/mannitol/xylitol and sucralose/Splenda (common artificial sweeteners), and raffinose (lentils, broccoli, cabbage, asparagus, brussel sprouts, many types of beans).  Do a Development worker, community for [employer] and you will find helpful information. Beano, a dietary supplement, will often help with raffinose-containing foods.  As with lactase tablets, you may need several per serving.  4.   Whenever possible, avoid processed food&meats and chemical additives.  High fructose corn syrup, a common sweetener, may be difficult to digest.  Eggs and soy (comes from the soybean, and added to many foods now) are other common bloating/gassy foods.  - Dr. Herma [facility]d [facility]

## 2018-03-01 ENCOUNTER — Encounter (INDEPENDENT_AMBULATORY_CARE_PROVIDER_SITE_OTHER): Payer: Medicare Other | Admitting: Ophthalmology

## 2018-03-01 DIAGNOSIS — H35033 Hypertensive retinopathy, bilateral: Secondary | ICD-10-CM | POA: Diagnosis not present

## 2018-03-01 DIAGNOSIS — H353122 Nonexudative age-related macular degeneration, left eye, intermediate dry stage: Secondary | ICD-10-CM

## 2018-03-01 DIAGNOSIS — H353211 Exudative age-related macular degeneration, right eye, with active choroidal neovascularization: Secondary | ICD-10-CM

## 2018-03-01 DIAGNOSIS — I10 Essential (primary) hypertension: Secondary | ICD-10-CM

## 2018-03-01 DIAGNOSIS — H43813 Vitreous degeneration, bilateral: Secondary | ICD-10-CM

## 2018-03-09 DIAGNOSIS — Z0120 Encounter for dental examination and cleaning without abnormal findings: Secondary | ICD-10-CM | POA: Diagnosis not present

## 2018-03-13 ENCOUNTER — Other Ambulatory Visit: Payer: Self-pay | Admitting: Cardiology

## 2018-03-13 DIAGNOSIS — I10 Essential (primary) hypertension: Secondary | ICD-10-CM

## 2018-03-15 ENCOUNTER — Encounter: Payer: Self-pay | Admitting: Family Medicine

## 2018-03-15 ENCOUNTER — Ambulatory Visit (INDEPENDENT_AMBULATORY_CARE_PROVIDER_SITE_OTHER): Payer: Medicare Other | Admitting: Family Medicine

## 2018-03-15 VITALS — BP 146/80 | HR 62 | Temp 98.7°F | Ht 69.75 in | Wt 205.0 lb

## 2018-03-15 DIAGNOSIS — R6889 Other general symptoms and signs: Secondary | ICD-10-CM | POA: Diagnosis not present

## 2018-03-15 DIAGNOSIS — R05 Cough: Secondary | ICD-10-CM

## 2018-03-15 DIAGNOSIS — R059 Cough, unspecified: Secondary | ICD-10-CM

## 2018-03-15 DIAGNOSIS — J329 Chronic sinusitis, unspecified: Secondary | ICD-10-CM

## 2018-03-15 LAB — POCT INFLUENZA A/B
Influenza A, POC: NEGATIVE
Influenza B, POC: NEGATIVE

## 2018-03-15 MED ORDER — IPRATROPIUM BROMIDE 0.06 % NA SOLN: 2.0000 | Freq: Four times a day (QID) | NASAL | 0 refills | Status: DC

## 2018-03-15 MED ORDER — AZITHROMYCIN 250 MG PO TABS: ORAL_TABLET | ORAL | 0 refills | Status: DC

## 2018-03-15 MED ORDER — METHYLPREDNISOLONE ACETATE 40 MG/ML IJ SUSP
40.0000 mg | Freq: Once | INTRAMUSCULAR | Status: AC
  Administered 2018-03-15: 40 mg via INTRAMUSCULAR

## 2018-03-15 NOTE — Progress Notes (Signed)
   Chief Complaint:  [name] is a 84 y.o. male who presents for same day appointment with a chief complaint of cough.   Assessment/Plan:  Cough / Sinusitis Likely secondary to viral URI. No signs of bacterial infection. Start atrovent for rhinorrhea/sinus congestion. Will give 40mg  IM depo-medrol for sore throat.Sent in a "pocket prescription" for azithromycin with strict instruction to not start unless symptoms worsen or fail to improve within the next several days. Recommended tylenol and/or motrin as needed for low grade fever and pain. Encouraged good oral hydration. Return precautions reviewed. Follow up as needed.      Subjective:  HPI:  Cough, acute problem Symptoms started this morning. Worsening over that time.  Associated with body aches, fevers, chills, and malaise.  He has been using atrovent which helps. No known sick contacts. No other treatments tried. No other obvious alleviating or aggravating factors.   ROS: Per HPI  PMH: He reports that he quit smoking about 55 years ago. His smoking use included cigarettes and cigars. He has a 0.75 pack-year smoking history. He has never used smokeless tobacco. He reports current alcohol use of about 14.0 standard drinks of alcohol per week. He reports that he does not use drugs.      Objective:  Physical Exam: BP (!) 146/80 (BP Location: Left Arm, Patient Position: Sitting, Cuff Size: Large)   Pulse 62   Temp 98.7 F (37.1 C) (Oral)   Ht 5' 9.75" (1.772 m)   Wt 205 lb (93 kg)   SpO2 98%   BMI 29.63 kg/m   Gen: NAD, resting comfortably HEENT: TMs with clear effusion.  OP erythematous.  Nasal mucosa erythematous and boggy with thick, green discharge. CV: Regular rate and rhythm with no murmurs appreciated Pulm: Normal work of breathing, scattered wheeze and rhonchi throughout.      Algis Greenhouse. Jerline Pain, MD 03/15/2018 4:28 PM

## 2018-03-15 NOTE — Patient Instructions (Signed)
Start the atrovent and azithromycin.   Please stay well hydrated.  You can take tylenol and/or motrin as needed for low grade fever and pain.  Please let me know if your symptoms worsen or fail to improve.  Take care, Dr Jerline Pain

## 2018-03-31 ENCOUNTER — Encounter (INDEPENDENT_AMBULATORY_CARE_PROVIDER_SITE_OTHER): Payer: Medicare Other | Admitting: Ophthalmology

## 2018-04-06 ENCOUNTER — Other Ambulatory Visit: Payer: Self-pay

## 2018-04-06 ENCOUNTER — Encounter (INDEPENDENT_AMBULATORY_CARE_PROVIDER_SITE_OTHER): Payer: Medicare Other | Admitting: Ophthalmology

## 2018-04-06 DIAGNOSIS — H35033 Hypertensive retinopathy, bilateral: Secondary | ICD-10-CM | POA: Diagnosis not present

## 2018-04-06 DIAGNOSIS — H353122 Nonexudative age-related macular degeneration, left eye, intermediate dry stage: Secondary | ICD-10-CM

## 2018-04-06 DIAGNOSIS — I10 Essential (primary) hypertension: Secondary | ICD-10-CM

## 2018-04-06 DIAGNOSIS — H353211 Exudative age-related macular degeneration, right eye, with active choroidal neovascularization: Secondary | ICD-10-CM | POA: Diagnosis not present

## 2018-04-06 DIAGNOSIS — H43813 Vitreous degeneration, bilateral: Secondary | ICD-10-CM

## 2018-04-19 ENCOUNTER — Encounter: Payer: Self-pay | Admitting: Family Medicine

## 2018-04-20 ENCOUNTER — Encounter: Payer: Self-pay | Admitting: Family Medicine

## 2018-04-20 ENCOUNTER — Ambulatory Visit (INDEPENDENT_AMBULATORY_CARE_PROVIDER_SITE_OTHER): Payer: Medicare Other | Admitting: Family Medicine

## 2018-04-20 VITALS — Temp 94.2°F | Ht 69.75 in | Wt 205.0 lb

## 2018-04-20 DIAGNOSIS — E119 Type 2 diabetes mellitus without complications: Secondary | ICD-10-CM

## 2018-04-20 DIAGNOSIS — E785 Hyperlipidemia, unspecified: Secondary | ICD-10-CM

## 2018-04-20 DIAGNOSIS — E1159 Type 2 diabetes mellitus with other circulatory complications: Secondary | ICD-10-CM

## 2018-04-20 DIAGNOSIS — E1169 Type 2 diabetes mellitus with other specified complication: Secondary | ICD-10-CM

## 2018-04-20 DIAGNOSIS — I152 Hypertension secondary to endocrine disorders: Secondary | ICD-10-CM

## 2018-04-20 DIAGNOSIS — D61818 Other pancytopenia: Secondary | ICD-10-CM

## 2018-04-20 DIAGNOSIS — I10 Essential (primary) hypertension: Secondary | ICD-10-CM

## 2018-04-20 DIAGNOSIS — C9141 Hairy cell leukemia, in remission: Secondary | ICD-10-CM

## 2018-04-20 NOTE — Progress Notes (Signed)
Phone [phone]   Subjective:  Virtual visit via Video note This visit type was conducted due to national recommendations for restrictions regarding the COVID-19 Pandemic (e.g. social distancing).  This format is felt to be most appropriate for this patient at this time balancing risks to patient and risks to population by having him in for in person visit.  All issues noted in this document were discussed and addressed.  No physical exam was performed (except for noted visual exam or audio findings with Telehealth visits).  The patient has consented to conduct a Telehealth visit and understands insurance will be billed.   Our team/I connected with [name] on 04/20/18 at 11:00 AM EDT by a video enabled telemedicine application ([email or web address]) and verified that I am speaking with the correct person using two identifiers.  Location patient: [place] Location provider: [facility], office Persons participating in the virtual visit:  patient  Our team/I discussed the limitations of evaluation and management by telemedicine and the availability of in person appointments. In light of current covid-19 pandemic, patient also understands that we are trying to protect them by minimizing in office contact if at all possible.  The patient expressed consent for telemedicine visit and agreed to proceed. Patient understands insurance will be billed.   ROS- No chest pain or shortness of breath. No headache or blurry vision.  Some morning congestion- with running ac has been better- didn't do well with dry heat from heater.   Past Medical History-  Patient Active Problem List   Diagnosis Date Noted  . Diabetes mellitus type II, controlled ([place]) 07/31/2016    Priority: High  . Hairy cell leukemia ([place]) 04/01/2012    Priority: High  . Pancytopenia ([place]) 03/25/2012    Priority: High  . Coronary artery disease     Priority: High  . History of prostate cancer 05/15/2015    Priority: Medium  . GERD  (gastroesophageal reflux disease) 04/25/2015    Priority: Medium  . IBS (irritable bowel syndrome) 07/21/2012    Priority: Medium  . Hypertension associated with diabetes ([place])     Priority: Medium  . Hyperlipidemia associated with type 2 diabetes mellitus ([place]) 04/20/2006    Priority: Medium  . Muscle cramping 01/23/2015    Priority: Low  . Raynaud's phenomenon 02/10/2012    Priority: Low  . Essential tremor 12/20/2010    Priority: Low  . Personal history of colonic polyps 12/15/2007    Priority: Low  . Actinic keratosis 02/21/2017  . 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 atorvastatin (LIPITOR) 20 MG tablet Take 1 tablet (20 mg total) by mouth daily. 90 tablet 3  . blood glucose meter kit and supplies Use to check blood sugar 5 times a week. Dx Code E11.65. 1 each 0  . dorzolamide (TRUSOPT) 2 % ophthalmic solution Place 1 drop into both eyes 2 (two) times daily. From optho    . glucose blood (ONETOUCH VERIO) test strip USE TO CHECK BLOOD GLUCOSE 5 TIMES PER WEEK E11.9 100 each 1  . isosorbide mononitrate (IMDUR) 60 MG 24 hr tablet Take 1 tablet (60 mg total) by mouth daily. 90 tablet 3  . Lancets (ONETOUCH ULTRASOFT) lancets Use to test blood sugar 5 times a week E11.65 100 each 4  . latanoprost (XALATAN) 0.005 % ophthalmic solution Place 1 drop into both eyes at bedtime.     [name] losartan (COZAAR) 25 MG  tablet Take 1 tablet (25 mg total) by mouth daily. 90 tablet 3  . LUTEIN PO Take 1 tablet by mouth daily.    . metFORMIN (GLUCOPHAGE) 500 MG tablet Take 1 tablet (500 mg total) by mouth 2 (two) times daily with a meal. 180 tablet 3  . metoprolol succinate (TOPROL-XL) 50 MG 24 hr tablet TAKE 1 TABLET BY MOUTH ONCE DAILY NEEDS APPOINTMENT FOR FUTURE REFILLS 30 tablet 0  . Multiple Vitamin (MULTIVITAMIN) tablet Take 1 tablet by mouth daily.    . nitroGLYCERIN (NITROSTAT) 0.4 MG SL  tablet Place 1 tablet (0.4 mg total) under the tongue every 5 (five) minutes as needed for chest pain (x 3 doses max). 25 tablet 3  . omeprazole (PRILOSEC) 40 MG capsule Take 1 capsule (40 mg total) by mouth daily. 90 capsule 3   No current facility-administered medications for this visit.      Objective:  Temp (!) 94.2 F (34.6 C) (Oral)   Ht 5' 9.75" (1.772 m)   Wt 205 lb (93 kg)   BMI 29.63 kg/m   Home temperature likely inaccurate as patient appears perfectly healthy at time of visit  Gen: NAD, resting comfortably Lungs: nonlabored, normal respiratory rate  Skin: appears dry, no obvious rash No distress     Assessment and Plan   #hypertension S: controlled on  imdur 60 mg, losartan 25 mg, toprol 39m XR. Rechecked with eye doctor 2 weeks ago and was normal.  BP Readings from Last 3 Encounters:  03/15/18 (!) 146/80- was ill at that time  02/03/18 134/70  01/22/18 130/86  A/P: with recent control at eye doctor's and only 1 elevation in last few readings elevated while ill- I suspect well controlle- I tink its ok to push follow up in person out 3 months  # Diabetes S: has been  controlled on metformin 5080mtwice a day. A1c goal <8 given age.  CBGs- as low as 103 and as high as 158. Average around 130s. Lab Results  Component Value Date   HGBA1C 6.4 12/17/2017   HGBA1C 6.8 05/14/2017   HGBA1C 6.2 02/20/2017   A/P: likely stable- planning to get a1c with [facility] to cut down # of visits to offices and has to get blood with them for hairy cell leukemia   #hyperlipidemia S: poorly controlled on last check- we started atorvastatin 2026maily (and stopped simvastaitn 34m12m looked back on my last note and we did not clearly instruct patient to stop simvastatin though and unfortunately he has been taking both- we corrected this today)  Lab Results  Component Value Date   CHOL 154 12/24/2017   HDL 50.30 12/24/2017   LDLCALC 35 07/17/2016   LDLDIRECT 78.0 12/24/2017    TRIG 328.0 (H) 12/24/2017   CHOLHDL 3 12/24/2017   A/P: likely improved- will try to get lipids at time of [facility] labs.   Other notes: 1.going to contact [facility] Dr. TimoZenda Alpers get lipid panel and a1c. He is doing at testing site and [place] at [phone] per patient  2. Hairy cell leukemia with pancytopenia related to this- has upcoming labs at [facility]- has been stable. Patient knows importance of social distancing in light of his prior conditions.   4 months in person if able from covid 19   Future Appointments  Date Time Provider [place]/2020  7:45 AM MattHayden Pedro TRE-TRE None   Lab/Order associations: Controlled type 2 diabetes mellitus  without complication, without long-term current use of insulin ([place])  Hypertension associated with diabetes ([place])  Hyperlipidemia associated with type 2 diabetes mellitus ([place])  Hairy cell leukemia, in remission ([place]), Chronic  Pancytopenia ([place]), Chronic  Return precautions advised.  Garret Reddish, MD

## 2018-04-20 NOTE — Assessment & Plan Note (Signed)
S: poorly controlled on last check- we started atorvastatin 20mg  daily (and stopped simvastaitn 20mg - I looked back on my last note and we did not clearly instruct patient to stop simvastatin though and unfortunately he has been taking both- we corrected this today)  Lab Results  Component Value Date   CHOL 154 12/24/2017   HDL 50.30 12/24/2017   LDLCALC 35 07/17/2016   LDLDIRECT 78.0 12/24/2017   TRIG 328.0 (H) 12/24/2017   CHOLHDL 3 12/24/2017   A/P: likely improved- will try to get lipids at time of [facility] labs.

## 2018-04-20 NOTE — Patient Instructions (Addendum)
Health Maintenance Due  Topic Date Due  . OPHTHALMOLOGY EXAM Please sign a ROI for your eye doctor to send records to our office 02/11/2018

## 2018-04-21 NOTE — Progress Notes (Signed)
Spoke to [name] and she stated she will give the message to Dr.Pardee's nurse to place orders for lipid and A1c. She also stated tht if this cannot be done they will call our office. Pt next appt. Is in June and they will try to draw it then.

## 2018-04-22 DIAGNOSIS — E119 Type 2 diabetes mellitus without complications: Secondary | ICD-10-CM | POA: Diagnosis not present

## 2018-04-22 DIAGNOSIS — C9141 Hairy cell leukemia, in remission: Secondary | ICD-10-CM | POA: Diagnosis not present

## 2018-04-22 DIAGNOSIS — E785 Hyperlipidemia, unspecified: Secondary | ICD-10-CM | POA: Diagnosis not present

## 2018-04-22 LAB — HEPATIC FUNCTION PANEL
ALT: 78 — AB (ref 10–40)
AST: 41 — AB (ref 14–40)
Alkaline Phosphatase: 52 (ref 25–125)
Bilirubin, Total: 0.5

## 2018-04-22 LAB — BASIC METABOLIC PANEL
BUN: 18 (ref 4–21)
Creatinine: 1.1 (ref 0.6–1.3)
Glucose: 130
Potassium: 4.1 (ref 3.4–5.3)
Sodium: 142 (ref 137–147)

## 2018-04-22 LAB — LIPID PANEL
Cholesterol: 145 (ref 0–200)
HDL: 52 (ref 35–70)
LDL Cholesterol: 65
Triglycerides: 300 — AB (ref 40–160)

## 2018-04-22 LAB — CBC AND DIFFERENTIAL
HCT: 46 (ref 41–53)
Hemoglobin: 15.2 (ref 13.5–17.5)
Neutrophils Absolute: 4
Platelets: 141 — AB (ref 150–399)
WBC: 6.2

## 2018-04-22 LAB — HEMOGLOBIN A1C: Hemoglobin A1C: 6.6

## 2018-05-03 ENCOUNTER — Encounter: Payer: Self-pay | Admitting: Family Medicine

## 2018-05-04 ENCOUNTER — Other Ambulatory Visit: Payer: Self-pay | Admitting: Family Medicine

## 2018-05-04 DIAGNOSIS — E119 Type 2 diabetes mellitus without complications: Secondary | ICD-10-CM | POA: Diagnosis not present

## 2018-05-04 DIAGNOSIS — H401111 Primary open-angle glaucoma, right eye, mild stage: Secondary | ICD-10-CM | POA: Diagnosis not present

## 2018-05-04 DIAGNOSIS — H353212 Exudative age-related macular degeneration, right eye, with inactive choroidal neovascularization: Secondary | ICD-10-CM | POA: Diagnosis not present

## 2018-05-05 DIAGNOSIS — R102 Pelvic and perineal pain: Secondary | ICD-10-CM | POA: Diagnosis not present

## 2018-05-05 DIAGNOSIS — Z8546 Personal history of malignant neoplasm of prostate: Secondary | ICD-10-CM | POA: Diagnosis not present

## 2018-05-05 DIAGNOSIS — N3946 Mixed incontinence: Secondary | ICD-10-CM | POA: Diagnosis not present

## 2018-05-06 ENCOUNTER — Other Ambulatory Visit: Payer: Self-pay

## 2018-05-06 ENCOUNTER — Encounter (INDEPENDENT_AMBULATORY_CARE_PROVIDER_SITE_OTHER): Payer: Medicare Other | Admitting: Ophthalmology

## 2018-05-06 DIAGNOSIS — H353211 Exudative age-related macular degeneration, right eye, with active choroidal neovascularization: Secondary | ICD-10-CM

## 2018-05-06 DIAGNOSIS — H353122 Nonexudative age-related macular degeneration, left eye, intermediate dry stage: Secondary | ICD-10-CM | POA: Diagnosis not present

## 2018-05-06 DIAGNOSIS — H43813 Vitreous degeneration, bilateral: Secondary | ICD-10-CM

## 2018-05-06 DIAGNOSIS — I10 Essential (primary) hypertension: Secondary | ICD-10-CM | POA: Diagnosis not present

## 2018-05-06 DIAGNOSIS — H35033 Hypertensive retinopathy, bilateral: Secondary | ICD-10-CM

## 2018-06-03 ENCOUNTER — Encounter (INDEPENDENT_AMBULATORY_CARE_PROVIDER_SITE_OTHER): Payer: Medicare Other | Admitting: Ophthalmology

## 2018-06-03 ENCOUNTER — Other Ambulatory Visit: Payer: Self-pay

## 2018-06-03 DIAGNOSIS — H353211 Exudative age-related macular degeneration, right eye, with active choroidal neovascularization: Secondary | ICD-10-CM | POA: Diagnosis not present

## 2018-06-03 DIAGNOSIS — H43813 Vitreous degeneration, bilateral: Secondary | ICD-10-CM

## 2018-06-03 DIAGNOSIS — I10 Essential (primary) hypertension: Secondary | ICD-10-CM | POA: Diagnosis not present

## 2018-06-03 DIAGNOSIS — H353122 Nonexudative age-related macular degeneration, left eye, intermediate dry stage: Secondary | ICD-10-CM

## 2018-06-03 DIAGNOSIS — H35033 Hypertensive retinopathy, bilateral: Secondary | ICD-10-CM | POA: Diagnosis not present

## 2018-07-01 ENCOUNTER — Telehealth: Payer: Self-pay | Admitting: Physical Therapy

## 2018-07-01 ENCOUNTER — Telehealth: Payer: Self-pay

## 2018-07-01 ENCOUNTER — Telehealth: Payer: Self-pay | Admitting: *Deleted

## 2018-07-01 ENCOUNTER — Encounter (INDEPENDENT_AMBULATORY_CARE_PROVIDER_SITE_OTHER): Payer: Medicare Other | Admitting: Ophthalmology

## 2018-07-01 ENCOUNTER — Telehealth: Payer: Medicare Other | Admitting: Physician Assistant

## 2018-07-01 DIAGNOSIS — Z20822 Contact with and (suspected) exposure to covid-19: Secondary | ICD-10-CM

## 2018-07-01 NOTE — Progress Notes (Unsigned)
I have spent 5 minutes in review of e-visit questionnaire, review and updating patient chart, medical decision making and response to patient.   Yoana Staib Cody Kortnee Bas, PA-C    

## 2018-07-01 NOTE — Telephone Encounter (Signed)
Testing recommended via e-visit with Elyn Aquas, PA due to symptoms.Appointment scheduled for 07/02/18 for 8:45am at the [facility] site. Informed to wear a mask and stay in vehicle. Patient reports having symptoms of body aches, feeling run down and diarrhea over the last 24 hours. Reviewed care advice including sips of liquids every hour/rest/antidiarrheal medication OTC. Discussed symptoms to call in with including SOB/Fever/continued diarrhea/vomiting/decreased urine output/dry mouth and lips. Stated he understood and will call back for a virtual appointment if symptoms don't improve over night.

## 2018-07-01 NOTE — Telephone Encounter (Signed)
Guessing he needs to be scheduled for a virtual visit to then get orders for Covid testing??  Copied from [place] [phone]. Topic: Appointment Scheduling - Scheduling Inquiry for Clinic >> Jul 01, 2018  1:13 PM Erick Blinks wrote: Reason for CRM: Pt needs to be seen with PCP for covid 19 testing orders. Please advise

## 2018-07-01 NOTE — Telephone Encounter (Signed)
Called pt again and still unable to LM due to VM being full

## 2018-07-01 NOTE — Progress Notes (Unsigned)
E-Visit for Corona Virus Screening   Your current symptoms could be consistent with the coronavirus.  Call your health care provider (Dr. Ansel Bong office) or local health department to request and arrange formal testing. Many health care providers can now test patients at their office but not all are.  Please quarantine yourself while awaiting your test results.  [place] [phone], [place], [place] [phone] or visit [email or web address]  and You have been enrolled in [place] for COVID-19.  Daily you will receive a questionnaire within the [place] website. Our COVID-19 response team will be monitoring your responses daily.    COVID-19 is a respiratory illness with symptoms that are similar to the flu. Symptoms are typically mild to moderate, but there have been cases of severe illness and death due to the virus. The following symptoms may appear 2-14 days after exposure: . Fever . Cough . Shortness of breath or difficulty breathing . Chills . Repeated shaking with chills . Muscle pain . Headache . Sore throat . New loss of taste or smell . Fatigue . Congestion or runny nose . Nausea or vomiting . Diarrhea  It is vitally important that if you feel that you have an infection such as this virus or any other virus that you stay home and away from places where you may spread it to others.  You should self-quarantine for 14 days if you have symptoms that could potentially be coronavirus or have been in close contact a with a person diagnosed with COVID-19 within the last 2 weeks. You should avoid contact with people age 1 and older.   You should wear a mask or cloth face covering over your nose and mouth if you must be around other people or animals, including pets (even at home). Try to stay at least 6 feet away from other people.  This will protect the people around you.   You may also take acetaminophen (Tylenol) as needed for fever.   Reduce your risk of any infection by using the same precautions used for avoiding the common cold or flu:  [name] Wash your hands often with soap and warm water for at least 20 seconds.  If soap and water are not readily available, use an alcohol-based hand sanitizer with at least 60% alcohol.  . If coughing or sneezing, cover your mouth and nose by coughing or sneezing into the elbow areas of your shirt or coat, into a tissue or into your sleeve (not your hands). . Avoid shaking hands with others and consider head nods or verbal greetings only. . Avoid touching your eyes, nose, or mouth with unwashed hands.  . Avoid close contact with people who are sick. . Avoid places or events with large numbers of people in one location, like concerts or sporting events. . Carefully consider travel plans you have or are making. . If you are planning any travel outside or inside the [place], visit the CDC's Travelers' Health webpage for the latest health notices. . If you have some symptoms but not all symptoms, continue to monitor at home and seek medical attention if your symptoms worsen. . If you are having a medical emergency, call 911.  HOME CARE . Only take medications as instructed by your medical team. . Drink plenty of fluids and get plenty of rest. . A steam or ultrasonic humidifier can help if you have congestion.   GET HELP RIGHT AWAY IF YOU HAVE EMERGENCY WARNING SIGNS**  FOR COVID-19. If you or someone is showing any of these signs seek emergency medical care immediately. Call 911 or proceed to your closest emergency facility if: . You develop worsening high fever. . Trouble breathing . Bluish lips or face . Persistent pain or pressure in the chest . New confusion . Inability to wake or stay awake . You cough up blood. . Your symptoms become more severe  **This list is not all possible  symptoms. Contact your medical provider for any symptoms that are sever or concerning to you.   MAKE SURE YOU   Understand these instructions.  Will watch your condition.  Will get help right away if you are not doing well or get worse.  Your e-visit answers were reviewed by a board certified advanced clinical practitioner to complete your personal care plan.  Depending on the condition, your plan could have included both over the counter or prescription medications.  If there is a problem please reply once you have received a response from your provider.  Your safety is important to [place].  If you have drug allergies check your prescription carefully.    You can use MyChart to ask questions about today's visit, request a non-urgent call back, or ask for a work or school excuse for 24 hours related to this e-Visit. If it has been greater than 24 hours you will need to follow up with your provider, or enter a new e-Visit to address those concerns. You will get an e-mail in the next two days asking about your experience.  I hope that your e-visit has been valuable and will speed your recovery. Thank you for using e-visits.

## 2018-07-01 NOTE — Telephone Encounter (Signed)
Called pt, VM full

## 2018-07-01 NOTE — Telephone Encounter (Signed)
Derek Olp, MD  P Dione Housekeeper; Brunetta Jeans, PA-C        [place]- thanks for update   Team can we set him up for virtual visit- im ok 2 pm or 4 40.   Garret Reddish

## 2018-07-02 ENCOUNTER — Other Ambulatory Visit: Payer: Medicare Other

## 2018-07-02 DIAGNOSIS — R6889 Other general symptoms and signs: Secondary | ICD-10-CM | POA: Diagnosis not present

## 2018-07-02 DIAGNOSIS — Z20822 Contact with and (suspected) exposure to covid-19: Secondary | ICD-10-CM

## 2018-07-02 NOTE — Telephone Encounter (Signed)
Called and spoke with patient. He had test done this morning. He is starting to feel a little better. He thinks it may have just been a stomach virus.

## 2018-07-06 ENCOUNTER — Encounter (INDEPENDENT_AMBULATORY_CARE_PROVIDER_SITE_OTHER): Payer: Medicare Other | Admitting: Ophthalmology

## 2018-07-06 ENCOUNTER — Other Ambulatory Visit: Payer: Self-pay

## 2018-07-06 DIAGNOSIS — H353122 Nonexudative age-related macular degeneration, left eye, intermediate dry stage: Secondary | ICD-10-CM | POA: Diagnosis not present

## 2018-07-06 DIAGNOSIS — H353211 Exudative age-related macular degeneration, right eye, with active choroidal neovascularization: Secondary | ICD-10-CM

## 2018-07-06 DIAGNOSIS — I10 Essential (primary) hypertension: Secondary | ICD-10-CM

## 2018-07-06 DIAGNOSIS — C9141 Hairy cell leukemia, in remission: Secondary | ICD-10-CM | POA: Diagnosis not present

## 2018-07-06 DIAGNOSIS — H43813 Vitreous degeneration, bilateral: Secondary | ICD-10-CM

## 2018-07-06 DIAGNOSIS — H35033 Hypertensive retinopathy, bilateral: Secondary | ICD-10-CM | POA: Diagnosis not present

## 2018-07-06 LAB — NOVEL CORONAVIRUS, NAA: SARS-CoV-2, NAA: NOT DETECTED

## 2018-07-08 DIAGNOSIS — M1711 Unilateral primary osteoarthritis, right knee: Secondary | ICD-10-CM | POA: Diagnosis not present

## 2018-07-08 DIAGNOSIS — G4700 Insomnia, unspecified: Secondary | ICD-10-CM | POA: Diagnosis not present

## 2018-07-08 DIAGNOSIS — C9141 Hairy cell leukemia, in remission: Secondary | ICD-10-CM | POA: Diagnosis not present

## 2018-07-08 DIAGNOSIS — L989 Disorder of the skin and subcutaneous tissue, unspecified: Secondary | ICD-10-CM | POA: Diagnosis not present

## 2018-07-13 ENCOUNTER — Other Ambulatory Visit: Payer: Self-pay

## 2018-07-13 MED ORDER — LOSARTAN POTASSIUM 25 MG PO TABS: 25.0000 mg | ORAL_TABLET | Freq: Every day | ORAL | 0 refills | Status: DC

## 2018-07-22 ENCOUNTER — Telehealth: Payer: Self-pay | Admitting: Family Medicine

## 2018-07-22 ENCOUNTER — Other Ambulatory Visit: Payer: Self-pay | Admitting: Family Medicine

## 2018-07-22 DIAGNOSIS — H401111 Primary open-angle glaucoma, right eye, mild stage: Secondary | ICD-10-CM | POA: Diagnosis not present

## 2018-07-22 DIAGNOSIS — H401123 Primary open-angle glaucoma, left eye, severe stage: Secondary | ICD-10-CM | POA: Diagnosis not present

## 2018-07-22 NOTE — Telephone Encounter (Signed)
See note

## 2018-07-22 NOTE — Telephone Encounter (Signed)
metFORMIN (GLUCOPHAGE) 500 MG tablet  [place], [place] [phone] (Phone) [phone] (Fax)   Pt needs refill last appt was 04/20/18

## 2018-07-28 ENCOUNTER — Telehealth: Payer: Self-pay | Admitting: Cardiology

## 2018-07-28 NOTE — Telephone Encounter (Signed)
LVM for patient to call back and schedule 1 year followup with Dr. [place].

## 2018-08-04 ENCOUNTER — Other Ambulatory Visit: Payer: Self-pay

## 2018-08-04 ENCOUNTER — Encounter (INDEPENDENT_AMBULATORY_CARE_PROVIDER_SITE_OTHER): Payer: Medicare Other | Admitting: Ophthalmology

## 2018-08-04 DIAGNOSIS — H35033 Hypertensive retinopathy, bilateral: Secondary | ICD-10-CM

## 2018-08-04 DIAGNOSIS — H353122 Nonexudative age-related macular degeneration, left eye, intermediate dry stage: Secondary | ICD-10-CM | POA: Diagnosis not present

## 2018-08-04 DIAGNOSIS — I10 Essential (primary) hypertension: Secondary | ICD-10-CM | POA: Diagnosis not present

## 2018-08-04 DIAGNOSIS — H353211 Exudative age-related macular degeneration, right eye, with active choroidal neovascularization: Secondary | ICD-10-CM

## 2018-08-04 DIAGNOSIS — H43813 Vitreous degeneration, bilateral: Secondary | ICD-10-CM

## 2018-08-10 ENCOUNTER — Other Ambulatory Visit: Payer: Self-pay

## 2018-08-10 DIAGNOSIS — E1169 Type 2 diabetes mellitus with other specified complication: Secondary | ICD-10-CM

## 2018-08-10 DIAGNOSIS — E119 Type 2 diabetes mellitus without complications: Secondary | ICD-10-CM

## 2018-08-10 DIAGNOSIS — I152 Hypertension secondary to endocrine disorders: Secondary | ICD-10-CM

## 2018-08-10 DIAGNOSIS — E1159 Type 2 diabetes mellitus with other circulatory complications: Secondary | ICD-10-CM

## 2018-08-10 DIAGNOSIS — I25810 Atherosclerosis of coronary artery bypass graft(s) without angina pectoris: Secondary | ICD-10-CM

## 2018-08-10 DIAGNOSIS — E7800 Pure hypercholesterolemia, unspecified: Secondary | ICD-10-CM

## 2018-08-10 DIAGNOSIS — E785 Hyperlipidemia, unspecified: Secondary | ICD-10-CM

## 2018-09-02 ENCOUNTER — Encounter (INDEPENDENT_AMBULATORY_CARE_PROVIDER_SITE_OTHER): Payer: Medicare Other | Admitting: Ophthalmology

## 2018-09-02 ENCOUNTER — Other Ambulatory Visit: Payer: Self-pay

## 2018-09-02 DIAGNOSIS — H35033 Hypertensive retinopathy, bilateral: Secondary | ICD-10-CM | POA: Diagnosis not present

## 2018-09-02 DIAGNOSIS — H43813 Vitreous degeneration, bilateral: Secondary | ICD-10-CM

## 2018-09-02 DIAGNOSIS — H353122 Nonexudative age-related macular degeneration, left eye, intermediate dry stage: Secondary | ICD-10-CM

## 2018-09-02 DIAGNOSIS — I10 Essential (primary) hypertension: Secondary | ICD-10-CM

## 2018-09-02 DIAGNOSIS — H353211 Exudative age-related macular degeneration, right eye, with active choroidal neovascularization: Secondary | ICD-10-CM

## 2018-09-13 ENCOUNTER — Other Ambulatory Visit: Payer: Self-pay | Admitting: Cardiology

## 2018-09-13 DIAGNOSIS — I10 Essential (primary) hypertension: Secondary | ICD-10-CM

## 2018-09-30 ENCOUNTER — Other Ambulatory Visit: Payer: Self-pay

## 2018-09-30 ENCOUNTER — Encounter (INDEPENDENT_AMBULATORY_CARE_PROVIDER_SITE_OTHER): Payer: Medicare Other | Admitting: Ophthalmology

## 2018-09-30 DIAGNOSIS — H353211 Exudative age-related macular degeneration, right eye, with active choroidal neovascularization: Secondary | ICD-10-CM

## 2018-09-30 DIAGNOSIS — I10 Essential (primary) hypertension: Secondary | ICD-10-CM

## 2018-09-30 DIAGNOSIS — H353122 Nonexudative age-related macular degeneration, left eye, intermediate dry stage: Secondary | ICD-10-CM | POA: Diagnosis not present

## 2018-09-30 DIAGNOSIS — H43813 Vitreous degeneration, bilateral: Secondary | ICD-10-CM

## 2018-09-30 DIAGNOSIS — H35033 Hypertensive retinopathy, bilateral: Secondary | ICD-10-CM

## 2018-10-07 DIAGNOSIS — I2510 Atherosclerotic heart disease of native coronary artery without angina pectoris: Secondary | ICD-10-CM | POA: Diagnosis not present

## 2018-10-07 DIAGNOSIS — Z23 Encounter for immunization: Secondary | ICD-10-CM | POA: Diagnosis not present

## 2018-10-07 DIAGNOSIS — C9141 Hairy cell leukemia, in remission: Secondary | ICD-10-CM | POA: Diagnosis not present

## 2018-10-07 DIAGNOSIS — L989 Disorder of the skin and subcutaneous tissue, unspecified: Secondary | ICD-10-CM | POA: Diagnosis not present

## 2018-10-07 DIAGNOSIS — I25810 Atherosclerosis of coronary artery bypass graft(s) without angina pectoris: Secondary | ICD-10-CM | POA: Diagnosis not present

## 2018-10-07 DIAGNOSIS — R5383 Other fatigue: Secondary | ICD-10-CM | POA: Diagnosis not present

## 2018-10-07 DIAGNOSIS — E119 Type 2 diabetes mellitus without complications: Secondary | ICD-10-CM | POA: Diagnosis not present

## 2018-10-07 DIAGNOSIS — Z79899 Other long term (current) drug therapy: Secondary | ICD-10-CM | POA: Diagnosis not present

## 2018-10-18 ENCOUNTER — Ambulatory Visit (INDEPENDENT_AMBULATORY_CARE_PROVIDER_SITE_OTHER): Payer: Medicare Other

## 2018-10-18 ENCOUNTER — Other Ambulatory Visit: Payer: Self-pay

## 2018-10-18 VITALS — BP 114/74 | Temp 97.8°F | Ht 70.0 in | Wt 206.2 lb

## 2018-10-18 DIAGNOSIS — Z0000 Encounter for general adult medical examination without abnormal findings: Secondary | ICD-10-CM | POA: Diagnosis not present

## 2018-10-18 NOTE — Patient Instructions (Addendum)
[name] , Thank you for taking time to come for your Medicare Wellness Visit. I appreciate your ongoing commitment to your health goals. Please review the following plan we discussed and let me know if I can assist you in the future.   Screening recommendations/referrals: Colorectal Screening: up to date; last 2010  Vision and Dental Exams: Recommended annual ophthalmology exams for early detection of glaucoma and other disorders of the eye Recommended annual dental exams for proper oral hygiene  Diabetic Exams: Diabetic Eye Exam: recommended yearly  Diabetic Foot Exam: at next visit   Vaccinations: Influenza vaccine: completed 10/07/18 Pneumococcal vaccine: up to date; last 11/01/15 Tdap vaccine: up to date last 03/25/12 Shingles vaccine: Please call your insurance company to determine your out of pocket expense for the Shingrix vaccine. You may receive this vaccine at your local pharmacy.  Advanced directives: Please bring a copy of your POA (Power of Attorney) and/or Living Will to your next appointment.  Goals: Recommend to drink at least 6-8 8oz glasses of water per day.  Next appointment: Please schedule your Annual Wellness Visit with your Nurse Health Advisor in one year. Please schedule an appointment with [name] Channel for April 2020 for labs and physical.   Preventive Care 65 Years and Older, Male Preventive care refers to lifestyle choices and visits with your health care provider that can promote health and wellness. What does preventive care include?  A yearly physical exam. This is also called an annual well check.  Dental exams once or twice a year.  Routine eye exams. Ask your health care provider how often you should have your eyes checked.  Personal lifestyle choices, including:  Daily care of your teeth and gums.  Regular physical activity.  Eating a healthy diet.  Avoiding tobacco and drug use.  Limiting alcohol use.  Practicing safe sex.  Taking  low doses of aspirin every day if recommended by your health care provider..  Taking vitamin and mineral supplements as recommended by your health care provider. What happens during an annual well check? The services and screenings done by your health care provider during your annual well check will depend on your age, overall health, lifestyle risk factors, and family history of disease. Counseling  Your health care provider may ask you questions about your:  Alcohol use.  Tobacco use.  Drug use.  Emotional well-being.  Home and relationship well-being.  Sexual activity.  Eating habits.  History of falls.  Memory and ability to understand (cognition).  Work and work Statistician. Screening  You may have the following tests or measurements:  Height, weight, and BMI.  Blood pressure.  Lipid and cholesterol levels. These may be checked every 5 years, or more frequently if you are over 68 years old.  Skin check.  Lung cancer screening. You may have this screening every year starting at age 60 if you have a 30-pack-year history of smoking and currently smoke or have quit within the past 15 years.  Fecal occult blood test (FOBT) of the stool. You may have this test every year starting at age 23.  Flexible sigmoidoscopy or colonoscopy. You may have a sigmoidoscopy every 5 years or a colonoscopy every 10 years starting at age 33.  Prostate cancer screening. Recommendations will vary depending on your family history and other risks.  Hepatitis C blood test.  Hepatitis B blood test.  Sexually transmitted disease (STD) testing.  Diabetes screening. This is done by checking your blood sugar (glucose) after you have  not eaten for a while (fasting). You may have this done every 1-3 years.  Abdominal aortic aneurysm (AAA) screening. You may need this if you are a current or former smoker.  Osteoporosis. You may be screened starting at age 39 if you are at high risk. Talk  with your health care provider about your test results, treatment options, and if necessary, the need for more tests. Vaccines  Your health care provider may recommend certain vaccines, such as:  Influenza vaccine. This is recommended every year.  Tetanus, diphtheria, and acellular pertussis (Tdap, Td) vaccine. You may need a Td booster every 10 years.  Zoster vaccine. You may need this after age 53.  Pneumococcal 13-valent conjugate (PCV13) vaccine. One dose is recommended after age 22.  Pneumococcal polysaccharide (PPSV23) vaccine. One dose is recommended after age 20. Talk to your health care provider about which screenings and vaccines you need and how often you need them. This information is not intended to replace advice given to you by your health care provider. Make sure you discuss any questions you have with your health care provider. Document Released: 01/26/2015 Document Revised: 09/19/2015 Document Reviewed: 10/31/2014 Elsevier Interactive Patient Education  2017 [employer] Prevention in the Home Falls can cause injuries. They can happen to people of all ages. There are many things you can do to make your home safe and to help prevent falls. What can I do on the outside of my home?  Regularly fix the edges of walkways and driveways and fix any cracks.  Remove anything that might make you trip as you walk through a door, such as a raised step or threshold.  Trim any bushes or trees on the path to your home.  Use bright outdoor lighting.  Clear any walking paths of anything that might make someone trip, such as rocks or tools.  Regularly check to see if handrails are loose or broken. Make sure that both sides of any steps have handrails.  Any raised decks and porches should have guardrails on the edges.  Have any leaves, snow, or ice cleared regularly.  Use sand or salt on walking paths during winter.  Clean up any spills in your garage right away. This  includes oil or grease spills. What can I do in the bathroom?  Use night lights.  Install grab bars by the toilet and in the tub and shower. Do not use towel bars as grab bars.  Use non-skid mats or decals in the tub or shower.  If you need to sit down in the shower, use a plastic, non-slip stool.  Keep the floor dry. Clean up any water that spills on the floor as soon as it happens.  Remove soap buildup in the tub or shower regularly.  Attach bath mats securely with double-sided non-slip rug tape.  Do not have throw rugs and other things on the floor that can make you trip. What can I do in the bedroom?  Use night lights.  Make sure that you have a light by your bed that is easy to reach.  Do not use any sheets or blankets that are too big for your bed. They should not hang down onto the floor.  Have a firm chair that has side arms. You can use this for support while you get dressed.  Do not have throw rugs and other things on the floor that can make you trip. What can I do in the kitchen?  Clean up any  spills right away.  Avoid walking on wet floors.  Keep items that you use a lot in easy-to-reach places.  If you need to reach something above you, use a strong step stool that has a grab bar.  Keep electrical cords out of the way.  Do not use floor polish or wax that makes floors slippery. If you must use wax, use non-skid floor wax.  Do not have throw rugs and other things on the floor that can make you trip. What can I do with my stairs?  Do not leave any items on the stairs.  Make sure that there are handrails on both sides of the stairs and use them. Fix handrails that are broken or loose. Make sure that handrails are as long as the stairways.  Check any carpeting to make sure that it is firmly attached to the stairs. Fix any carpet that is loose or worn.  Avoid having throw rugs at the top or bottom of the stairs. If you do have throw rugs, attach them to the  floor with carpet tape.  Make sure that you have a light switch at the top of the stairs and the bottom of the stairs. If you do not have them, ask someone to add them for you. What else can I do to help prevent falls?  Wear shoes that:  Do not have high heels.  Have rubber bottoms.  Are comfortable and fit you well.  Are closed at the toe. Do not wear sandals.  If you use a stepladder:  Make sure that it is fully opened. Do not climb a closed stepladder.  Make sure that both sides of the stepladder are locked into place.  Ask someone to hold it for you, if possible.  Clearly mark and make sure that you can see:  Any grab bars or handrails.  First and last steps.  Where the edge of each step is.  Use tools that help you move around (mobility aids) if they are needed. These include:  Canes.  Walkers.  Scooters.  Crutches.  Turn on the lights when you go into a dark area. Replace any light bulbs as soon as they burn out.  Set up your furniture so you have a clear path. Avoid moving your furniture around.  If any of your floors are uneven, fix them.  If there are any pets around you, be aware of where they are.  Review your medicines with your doctor. Some medicines can make you feel dizzy. This can increase your chance of falling. Ask your doctor what other things that you can do to help prevent falls. This information is not intended to replace advice given to you by your health care provider. Make sure you discuss any questions you have with your health care provider. Document Released: 10/26/2008 Document Revised: 06/07/2015 Document Reviewed: 02/03/2014 Elsevier Interactive Patient Education  2017 [employer].

## 2018-10-18 NOTE — Progress Notes (Signed)
I have reviewed and agree with note, evaluation, plan.   I appreciate education on sleep hygiene-I will have him make sure he consistently has healthy habits in regards to sleep hygiene over the next 2 to 4 weeks and if not improving-can schedule follow-up with me.  He could also try a low-dose of melatonin before bed 1 to 2.5 mg if he would like-make sure to have consistent bedtime if he chooses this option  Garret Reddish, MD

## 2018-10-18 NOTE — Progress Notes (Signed)
Subjective:   [name] is a 84 y.o. male who presents for Medicare Annual/Subsequent preventive examination.  Review of Systems:   Cardiac Risk Factors include: advanced age (>30mn, >>88women);hypertension;male gender;diabetes mellitus;dyslipidemia     Objective:    Vitals: BP 114/74 (BP Location: Left Arm, Patient Position: Sitting, Cuff Size: Normal)   Temp 97.8 F (36.6 C) (Temporal)   Ht '5\' 10"'  (1.778 m)   Wt 206 lb 3.2 oz (93.5 kg)   BMI 29.59 kg/m   Body mass index is 29.59 kg/m.  Advanced Directives 10/18/2018 09/17/2017 09/17/2017 09/03/2016 09/02/2016 03/25/2012 04/28/2011  Does Patient Have a Medical Advance Directive? Yes No Yes No No Patient does not have advance directive;Patient would not like information Patient would like information  Type of Advance Directive Living will;Healthcare Power of Attorney - - - - - -  Does patient want to make changes to medical advance directive? No - Patient declined - - - - - -  Copy of [place]? No - copy requested - - - - - -  Would patient like information on creating a medical advance directive? - - - - No - Patient declined - -    Tobacco Social History   Tobacco Use  Smoking Status Former Smoker  . Packs/day: 0.25  . Years: 3.00  . Pack years: 0.75  . Types: Cigarettes, Cigars  . Quit date: 01/14/1963  . Years since quitting: 55.7  Smokeless Tobacco Never Used  Tobacco Comment   QUIT SMOKING CIGARETTES 1964 AND QUIT OCCASIONAL CIGAR  2007     Counseling given: Not Answered Comment: [place] QUIT OCCASION[place] CIGAR  2007   Clinical Intake:  Pre-visit preparation completed: Yes  Pain : No/denies pain  Diabetes: Yes CBG done?: No Did pt. bring in CBG monitor from home?: No  How often do you need to have someone help you when you read instructions, pamphlets, or other written materials from your doctor or pharmacy?: 1 - Never  Interpreter Needed?: No   Information entered by :: Derek GeorgeLPN  Past Medical History:  Diagnosis Date  . Adenomatous colon polyp   . Anxiety   . Arthritis   . Bladder neck contracture   . Cataract immature BILATERAL  . Coronary artery disease CARDIOLOGIST- DR JMartinique  LAST VISIT NOTE 12-20-2010 IN EPIC AND W/ CHART   subendocranial MI; left inter. mamm to LAD, saph vein to diag; saph vein to distal circ; saph vein to 1st obtuse  . Diabetes mellitus without complication ([place]   . DIVERTICULOSIS, COLON 09/05/2003   Qualifier: Diagnosis of  By: SNolon RodCMA (AAMA), Robin    . EUSTACHIAN TUBE DYSFUNCTION, BILATERAL 06/22/2008   Qualifier: Diagnosis of  By: HLinna DarnerMD, William    . EXTERNAL HEMORRHOIDS 12/14/2007   Qualifier: Diagnosis of  By: SNolon RodCMA (AAMA), Robin    . Frequency of urination   . GERD (gastroesophageal reflux disease)   . Glaucoma BILATERAL  . H/O hiatal hernia   . Hairy cell leukemia ([place]   . History of atherosclerotic cardiovascular disease   . History of kidney stones   . History of myocardial infarction 2006   S/P CABG  . History of prostate cancer S/P PROSTATECTOMY-  NO RECURRENCE   FOLLOWED BY DR GRisa Grill . Hyperlipidemia   . Hypertension   . Hypertriglyceridemia   . Insomnia   . Macular degeneration   . Nocturia   . Raynaud's phenomenon   .  Right arm cellulitis 04/02/12   post cat bite  . S/P CABG x 4 2006  . Stable angina (H[facility])   . Urgency of urination    Past Surgical History:  Procedure Laterality Date  . BALLOON DILATION  04/30/2011   Procedure: BALLOON DILATION;  Surgeon: Bernestine Amass, MD;  Location: [facility];  Service: Urology;  Laterality: N/A;  . CARDIAC CATHETERIZATION  07-30-2006  DR Martinique   POST CABG / OCCLUDED DIAGONAL &  FOURTH OBTUSE MARGINAL GRAFTS/ NORMAL LVF/ PATENT LIMA TO LAD & SEPHENOUS VEIN GRAFT TO 1ST OBTUSE MARGINAL   . CARDIAC CATHETERIZATION  04-23-2004   CRITICAL THREE-VESSEL CAD/ PRESERVED LVF  . CORONARY ARTERY  BYPASS GRAFT  04-25-2004  X4 VESSELS   left inter. mamm to LAD, saph vein to diag; saph vein to distal circ; saph vein to 1st obtuse  . CYSTOSCOPY  04/30/2011   Procedure: CYSTOSCOPY;  Surgeon: Bernestine Amass, MD;  Location: [facility];  Service: Urology;  Laterality: N/A;  30 MINUTES   . DEBRIDEMENT TENNIS ELBOW  2002   RIGHT; [facility]  . LAPAROSCOPIC CHOLECYSTECTOMY  02-05-2005  . ROBOT ASSISTED LAPAROSCOPIC RADICAL PROSTATECTOMY  05-11-2006   Family History  Problem Relation Age of Onset  . Heart disease Sister        CABGX 1 sister and stents X1 sister  . Cancer Sister   . Parkinsonism Father   . Diabetes Neg Hx   . Stroke Neg Hx    Social History   Socioeconomic History  . Marital status: Married    Spouse name: Not on file  . Number of children: 2  . Years of education: Not on file  . Highest education level: Not on file  Occupational History  . Occupation: MANUFACTURER'S REP    Employer: [employer]  . Financial resource strain: Not on file  . Food insecurity    Worry: Not on file    Inability: Not on file  . Transportation needs    Medical: Not on file    Non-medical: Not on file  Tobacco Use  . Smoking status: Former Smoker    Packs/day: 0.25    Years: 3.00    Pack years: 0.75    Types: Cigarettes, Cigars    Quit date: 01/14/1963    Years since quitting: 55.7  . Smokeless tobacco: Never Used  . Tobacco comment: [address] AND QUIT OCCASIONAL CIGAR  2007  Substance and Sexual Activity  . Alcohol use: Yes    Alcohol/week: 14.0 standard drinks    Types: 7 Glasses of wine, 7 Shots of liquor per week    Comment: has dinner drink shot and 2 glasses of wine   . Drug use: No  . Sexual activity: Not on file  Lifestyle  . Physical activity    Days per week: Not on file    Minutes per session: Not on file  . Stress: Not on file  Relationships  . Social Herbalist on phone: Not on file     Gets together: Not on file    Attends religious service: Not on file    Active member of club or organization: Not on file    Attends meetings of clubs or organizations: Not on file    Relationship status: Not on file  Other Topics Concern  . Not on file  Social History Narrative   Married. 2 sons. 4 grandkids.  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     Outpatient Encounter Medications as of 10/18/2018  Medication Sig  . atorvastatin (LIPITOR) 20 MG tablet Take 1 tablet by mouth once daily  . blood glucose meter kit and supplies Use to check blood sugar 5 times a week. Dx Code E11.65.  [name] dorzolamide (TRUSOPT) 2 % ophthalmic solution Place 1 drop into both eyes 2 (two) times daily. From optho  . dorzolamide-timolol (COSOPT) 22.3-6.8 MG/ML ophthalmic solution INSTILL 1 DROP INTO EACH EYE TWICE DAILY  . glucose blood (ONETOUCH VERIO) test strip USE TO CHECK BLOOD GLUCOSE 5 TIMES PER WEEK E11.9  . isosorbide mononitrate (IMDUR) 60 MG 24 hr tablet Take 1 tablet (60 mg total) by mouth daily.  . Lancets (ONETOUCH ULTRASOFT) lancets Use to test blood sugar 5 times a week E11.65  . latanoprost (XALATAN) 0.005 % ophthalmic solution Place 1 drop into both eyes at bedtime.   [name] losartan (COZAAR) 25 MG tablet Take 1 tablet (25 mg total) by mouth daily.  . LUTEIN PO Take 1 tablet by mouth daily.  . metFORMIN (GLUCOPHAGE) 500 MG tablet Take 1 tablet by mouth once daily  . metoprolol succinate (TOPROL-XL) 50 MG 24 hr tablet TAKE 1 TABLET BY MOUTH ONCE DAILY WITH OR IMMEDIATELY FOLLOWING A MEAL  . Multiple Vitamin (MULTIVITAMIN) tablet Take 1 tablet by mouth daily.  . nitroGLYCERIN (NITROSTAT) 0.4 MG SL tablet Place 1 tablet (0.4 mg total) under the tongue every 5 (five) minutes as needed for chest pain (x 3 doses max).  [name] omeprazole (PRILOSEC) 40 MG capsule TAKE 1 CAPSULE BY MOUTH IN THE EVENING   No facility-administered encounter medications on file as of 10/18/2018.     Activities of  Daily Living In your present state of health, do you have any difficulty performing the following activities: 10/18/2018  Hearing? N  Vision? Y  Comment visual impairment  Difficulty concentrating or making decisions? N  Walking or climbing stairs? N  Dressing or bathing? N  Doing errands, shopping? N  Preparing Food and eating ? N  Using the Toilet? N  In the past six months, have you accidently leaked urine? N  Do you have problems with loss of bowel control? N  Managing your Medications? N  Managing your Finances? N  Housekeeping or managing your Housekeeping? N  Some recent data might be hidden    Patient Care Team: Marin Olp, MD as PCP - General (Family Medicine) Hayden Pedro, MD as Consulting Physician (Ophthalmology) Art Buff, MD as Consulting Physician (Hematology and Oncology) Martinique, Peter M, MD as Consulting Physician (Cardiology)   Assessment:   This is a routine wellness examination for [name].  Exercise Activities and Dietary recommendations Current Exercise Habits: The patient does not participate in regular exercise at present  Goals    . patient     Good luck looking for new passions [name] watching or other Try to stay engage -      . patient     Will feel empowered and take more control my your health!    . Patient Stated     Improve my  activity  Has a bike at home; can start using a little at a time  You value vitality  May explore fit bit or apple watch         Fall Risk Fall Risk  10/18/2018 09/17/2017 09/16/2016 09/09/2016 09/02/2016  Falls in the past year? 0 Yes No No No  Comment - had an issue with vertigo and is not resolrved - - -  Number falls in past yr: - 2 or more - - -  Injury with Fall? 0 No - - -  Risk for fall due to : Impaired vision - - - -  Follow up Falls evaluation completed;Education provided;Falls prevention discussed - - - -   Is the patient's home free of loose throw rugs in walkways, pet beds,  electrical cords, etc?   yes      Grab bars in the bathroom? yes      Handrails on the stairs?   yes      Adequate lighting?   yes  Timed Get Up and Go Performed: completed and within normal timeframe; no gait abnormalities noted   Depression Screen PHQ 2/9 Scores 10/18/2018 09/17/2017 09/16/2016 09/09/2016  PHQ - 2 Score 0 0 0 0    Cognitive Function- no cognitive concerns at this time  MMSE - Mini Mental State Exam 09/17/2017 09/17/2017  Not completed: (No Data) (No Data)     6CIT Screen 10/18/2018  What Year? 0 points  What month? 0 points  What time? 0 points  Count back from 20 0 points  Months in reverse 0 points  Repeat phrase 0 points  Total Score 0    Immunization History  Administered Date(s) Administered  . Influenza Whole 10/23/2008, 10/12/2009  . Influenza, High Dose Seasonal PF 11/11/2013, 11/01/2015, 11/03/2016, 09/17/2017, 10/07/2018  . Influenza-Unspecified 12/02/2012  . Pneumococcal Conjugate-13 11/01/2015  . Pneumococcal Polysaccharide-23 10/18/2007  . Td 03/25/2012    Qualifies for Shingles Vaccine? Discussed and patient will check with pharmacy for coverage.  Patient education handout provided   Screening Tests Health Maintenance  Topic Date Due  . OPHTHALMOLOGY EXAM  02/11/2018  . FOOT EXAM  07/21/2018  . INFLUENZA VACCINE  08/14/2018  . HEMOGLOBIN A1C  10/22/2018  . TETANUS/TDAP  03/26/2022  . PNA vac Low Risk Adult  Completed   Cancer Screenings: Lung: Low Dose CT Chest recommended if Age 49-80 years, 30 pack-year currently smoking OR have quit w/in 15years. Patient does not qualify. Colorectal: colonoscopy 2010; no further indicated       Plan:  I have personally reviewed and addressed the Medicare Annual Wellness questionnaire and have noted the following in the patient's chart:  A. Medical and social history B. Use of alcohol, tobacco or illicit drugs  C. Current medications and supplements D. Functional ability and status E.  Nutritional  status F.  Physical activity G. Advance directives H. List of other physicians I.  Hospitalizations, surgeries, and ER visits in previous 12 months J.  [place] such as hearing and vision if needed, cognitive and depression L. Referrals, records requested, and appointments- will request diabetic eye exam notes   In addition, I have reviewed and discussed with patient certain preventive protocols, quality metrics, and best practice recommendations. A written personalized care plan for preventive services as well as general preventive health recommendations were provided to patient.   Signed,  Denman George, LPN  Nurse Health Advisor   Nurse Notes: Patient has concerns with increased insomnia.  States that he awakens around 3 am most nights and is unable to go back to sleep.  Does nap during the day at times.  Usually lays down for bed at 9-10pm. Is not taking any otc sleep aids.  Patient education provided on sleep hygiene.  Please advise.

## 2018-10-24 ENCOUNTER — Other Ambulatory Visit: Payer: Self-pay | Admitting: Family Medicine

## 2018-10-25 MED ORDER — METFORMIN HCL 500 MG PO TABS: 500.0000 mg | ORAL_TABLET | Freq: Every day | ORAL | 0 refills | Status: DC

## 2018-10-28 ENCOUNTER — Encounter (INDEPENDENT_AMBULATORY_CARE_PROVIDER_SITE_OTHER): Payer: Medicare Other | Admitting: Ophthalmology

## 2018-10-28 DIAGNOSIS — H35033 Hypertensive retinopathy, bilateral: Secondary | ICD-10-CM | POA: Diagnosis not present

## 2018-10-28 DIAGNOSIS — H353122 Nonexudative age-related macular degeneration, left eye, intermediate dry stage: Secondary | ICD-10-CM

## 2018-10-28 DIAGNOSIS — H353211 Exudative age-related macular degeneration, right eye, with active choroidal neovascularization: Secondary | ICD-10-CM

## 2018-10-28 DIAGNOSIS — I10 Essential (primary) hypertension: Secondary | ICD-10-CM

## 2018-10-28 DIAGNOSIS — H43813 Vitreous degeneration, bilateral: Secondary | ICD-10-CM

## 2018-10-28 NOTE — Progress Notes (Signed)
[name] Date of Birth: Aug 11, 1934   History of Present Illness: [name] is seen today for followup CAD. He has a history of coronary disease and is status post CABG in 2006.  He had a normal stress Myoview study in August 2017. He has a history of  hairy cell leukemia. He was treated with Cladribine at [facility] completed in 2014. This year he had recurrence and was treated with chemo therapy with Pentostatin and Rituxan. BM biopsy in June 2017 was normal but on follow up in August 2018 he had recurrence. Treated with Rituxan and vemurafenib. Completed treatment in November 2019.   On follow up today he is doing well from a cardiac standpoint.  He denies any symptoms of chest pain or SOB. Has not had to use sl Ntg.   He now has a stationary bike his is using.   Current Outpatient Medications on File Prior to Visit  Medication Sig Dispense Refill  . atorvastatin (LIPITOR) 20 MG tablet Take 1 tablet by mouth once daily 90 tablet 0  . blood glucose meter kit and supplies Use to check blood sugar 5 times a week. Dx Code E11.65. 1 each 0  . dorzolamide (TRUSOPT) 2 % ophthalmic solution Place 1 drop into both eyes 2 (two) times daily. From optho    . dorzolamide-timolol (COSOPT) 22.3-6.8 MG/ML ophthalmic solution INSTILL 1 DROP INTO EACH EYE TWICE DAILY    . glucose blood (ONETOUCH VERIO) test strip USE TO CHECK BLOOD GLUCOSE 5 TIMES PER WEEK E11.9 100 each 1  . isosorbide mononitrate (IMDUR) 60 MG 24 hr tablet Take 1 tablet (60 mg total) by mouth daily. 90 tablet 3  . Lancets (ONETOUCH ULTRASOFT) lancets Use to test blood sugar 5 times a week E11.65 100 each 4  . latanoprost (XALATAN) 0.005 % ophthalmic solution Place 1 drop into both eyes at bedtime.     [name] losartan (COZAAR) 25 MG tablet Take 1 tablet (25 mg total) by mouth daily. 90 tablet 0  . LUTEIN PO Take 1 tablet by mouth daily.    . metFORMIN (GLUCOPHAGE) 500 MG tablet Take 1 tablet (500 mg total) by mouth  daily. 90 tablet 0  . metFORMIN (GLUCOPHAGE) 500 MG tablet Take 1 tablet by mouth once daily 90 tablet 0  . metoprolol succinate (TOPROL-XL) 50 MG 24 hr tablet TAKE 1 TABLET BY MOUTH ONCE DAILY WITH OR IMMEDIATELY FOLLOWING A MEAL 30 tablet 1  . Multiple Vitamin (MULTIVITAMIN) tablet Take 1 tablet by mouth daily.    . nitroGLYCERIN (NITROSTAT) 0.4 MG SL tablet Place 1 tablet (0.4 mg total) under the tongue every 5 (five) minutes as needed for chest pain (x 3 doses max). 25 tablet 3   No current facility-administered medications on file prior to visit.     Allergies  Allergen Reactions  . Clindamycin Other (See Comments)    Unknown reaction from childhood Other reaction(s): Other (See Comments) Unknown reaction from childhood     Past Medical History:  Diagnosis Date  . Adenomatous colon polyp   . Anxiety   . Arthritis   . Bladder neck contracture   . Cataract immature BILATERAL  . Coronary artery disease CARDIOLOGIST- DR Martinique-  LAST VISIT NOTE 12-20-2010 IN EPIC AND W/ CHART   subendocranial MI; left inter. mamm to LAD, saph vein to diag; saph vein to distal circ; saph vein to 1st obtuse  . Diabetes mellitus without complication ([place])   . DIVERTICULOSIS, COLON 09/05/2003  Qualifier: Diagnosis of  By: Nolon Rod CMA (AAMA), Robin    . EUSTACHIAN TUBE DYSFUNCTION, BILATERAL 06/22/2008   Qualifier: Diagnosis of  By: Linna Darner MD, William    . EXTERNAL HEMORRHOIDS 12/14/2007   Qualifier: Diagnosis of  By: Nolon Rod CMA (AAMA), Robin    . Frequency of urination   . GERD (gastroesophageal reflux disease)   . Glaucoma BILATERAL  . H/O hiatal hernia   . Hairy cell leukemia ([place])   . History of atherosclerotic cardiovascular disease   . History of kidney stones   . History of myocardial infarction 2006   S/P CABG  . History of prostate cancer S/P PROSTATECTOMY-  NO RECURRENCE   FOLLOWED BY DR Risa Grill  . Hyperlipidemia   . Hypertension   . Hypertriglyceridemia   . Insomnia   .  Macular degeneration   . Nocturia   . Raynaud's phenomenon   . Right arm cellulitis 04/02/12   post cat bite  . S/P CABG x 4 2006  . Stable angina (H[facility])   . Urgency of urination     Past Surgical History:  Procedure Laterality Date  . BALLOON DILATION  04/30/2011   Procedure: BALLOON DILATION;  Surgeon: Bernestine Amass, MD;  Location: [facility];  Service: Urology;  Laterality: N/A;  . CARDIAC CATHETERIZATION  07-30-2006  DR Martinique   POST CABG / OCCLUDED DIAGONAL &  FOURTH OBTUSE MARGINAL GRAFTS/ NORMAL LVF/ PATENT [place] TO LAD & SEPHENOUS VEIN GRAFT TO 1ST OBTUSE MARGINAL   . CARDIAC CATHETERIZATION  04-23-2004   CRITICAL THREE-VESSEL CAD/ PRESERVED LVF  . CORONARY ARTERY BYPASS GRAFT  04-25-2004  X4 VESSELS   left inter. mamm to LAD, saph vein to diag; saph vein to distal circ; saph vein to 1st obtuse  . CYSTOSCOPY  04/30/2011   Procedure: CYSTOSCOPY;  Surgeon: Bernestine Amass, MD;  Location: [facility];  Service: Urology;  Laterality: N/A;  30 MINUTES   . DEBRIDEMENT TENNIS ELBOW  2002   RIGHT; [facility]  . LAPAROSCOPIC CHOLECYSTECTOMY  02-05-2005  . ROBOT ASSISTED LAPAROSCOPIC RADICAL PROSTATECTOMY  05-11-2006    Social History   Tobacco Use  Smoking Status Former Smoker  . Packs/day: 0.25  . Years: 3.00  . Pack years: 0.75  . Types: Cigarettes, Cigars  . Quit date: 01/14/1963  . Years since quitting: 55.8  Smokeless Tobacco Never Used  Tobacco Comment   QUIT SMOKING CIGARETTES 1964 AND QUIT OCCASIONAL CIGAR  2007    Social History   Substance and Sexual Activity  Alcohol Use Yes  . Alcohol/week: 14.0 standard drinks  . Types: 7 Glasses of wine, 7 Shots of liquor per week   Comment: has dinner drink shot and 2 glasses of wine     Family History  Problem Relation Age of Onset  . Heart disease Sister        CABGX 1 sister and stents X1 sister  . Cancer Sister   . Parkinsonism Father   . Diabetes Neg Hx   . Stroke Neg Hx      Review of Systems: As noted in history of present illness. All other systems were reviewed and are negative.  Physical Exam: BP 130/80 (BP Location: Left Arm, Patient Position: Sitting, Cuff Size: Normal)   Pulse (!) 56   Temp (!) 97 F (36.1 C)   Ht '5\' 10"'  (1.778 m)   Wt 208 lb (94.3 kg)   BMI 29.84 kg/m  GENERAL:  Well appearing WM in NAD  HEENT:  PERRL, EOMI, sclera are clear. Oropharynx is clear. NECK:  No jugular venous distention, carotid upstroke brisk and symmetric, no bruits, no thyromegaly or adenopathy LUNGS:  Clear to auscultation bilaterally CHEST:  Unremarkable HEART:  RRR,  PMI not displaced or sustained,S1 and S2 within normal limits, no S3, no S4: no clicks, no rubs, gr 2/6 SEM ABD:  Soft, nontender. BS +, no masses or bruits. No hepatomegaly, no splenomegaly EXT:  2 + pulses throughout, no edema, no cyanosis no clubbing SKIN:  Warm and dry.  No rashes NEURO:  Alert and oriented x 3. Cranial nerves II through XII intact. PSYCH:  Cognitively intact    LABORATORY DATA: Lab Results  Component Value Date   WBC 6.2 04/22/2018   HGB 15.2 04/22/2018   HCT 46 04/22/2018   PLT 141 (A) 04/22/2018   GLUCOSE 163 (H) 10/08/2016   CHOL 145 04/22/2018   TRIG 300 (A) 04/22/2018   HDL 52 04/22/2018   LDLDIRECT 78.0 12/24/2017   LDLCALC 65 04/22/2018   ALT 78 (A) 04/22/2018   AST 41 (A) 04/22/2018   NA 142 04/22/2018   K 4.1 04/22/2018   CL 105 10/08/2016   CREATININE 1.1 04/22/2018   BUN 18 04/22/2018   CO2 14 (L) 10/08/2016   TSH 0.559 03/26/2012   PSA <0.015 04/24/2017   INR 1.19 03/29/2012   HGBA1C 6.6 04/22/2018   Labs from [facility] reviewed from July 11, 2015. WBC 3.5, plts 140K. Hgb 12.5. Chemistry panel is normal.  Bone marrow biopsy on 06/28/15 showed normal marrow without evidence of leukemia.  Labs dated 05/08/16: glucose 247, creatinine 1.09, AST 41, AST 52. Other chemistries normal. Hgb normal. Dated 08/13/17: A1c 6.6%. Glucose  153, otherwise CMET normal. plts 111K, Hgb normal.   Ecg today shows NSR with LAFB RBBB.  No acute change. I have personally reviewed and interpreted this study.   Myoview 08/28/15: Study Highlights     Nuclear stress EF: 63%. Post CABG septal wall hypokinesis  There was no ST segment deviation noted during stress.  Defect 1: There is a small defect of mild severity present in the apex location. No ischemia.  This is a low risk study.     Assessment / Plan: 1. Coronary disease status post CABG. No significant anginal symptoms. Myoview study in August 2017 was normal. Continue aspirin, isosorbide, and metoprolol.  2. Hypertension. Blood pressure is well controlled.   3. Hyperlipidemia.  Continue current Zocor dose. Higher doses in the past lead to myalgias. LDL is at goal at 65.   4. Hairy cell leukemia. Recurrent. S/p repeat chemotherapy. Followed by Dr. Linus Orn at [facility].   5. DM on metformin.   6. Macular degeneration.  I will follow up in one year.

## 2018-11-01 ENCOUNTER — Other Ambulatory Visit: Payer: Self-pay | Admitting: Family Medicine

## 2018-11-04 ENCOUNTER — Other Ambulatory Visit: Payer: Self-pay

## 2018-11-04 ENCOUNTER — Other Ambulatory Visit: Payer: Self-pay | Admitting: Family Medicine

## 2018-11-04 ENCOUNTER — Encounter: Payer: Self-pay | Admitting: Cardiology

## 2018-11-04 ENCOUNTER — Ambulatory Visit (INDEPENDENT_AMBULATORY_CARE_PROVIDER_SITE_OTHER): Payer: Medicare Other | Admitting: Cardiology

## 2018-11-04 VITALS — BP 130/80 | HR 56 | Temp 97.0°F | Ht 70.0 in | Wt 208.0 lb

## 2018-11-04 DIAGNOSIS — I10 Essential (primary) hypertension: Secondary | ICD-10-CM

## 2018-11-04 DIAGNOSIS — E785 Hyperlipidemia, unspecified: Secondary | ICD-10-CM

## 2018-11-04 DIAGNOSIS — E1169 Type 2 diabetes mellitus with other specified complication: Secondary | ICD-10-CM | POA: Diagnosis not present

## 2018-11-04 DIAGNOSIS — E1159 Type 2 diabetes mellitus with other circulatory complications: Secondary | ICD-10-CM | POA: Diagnosis not present

## 2018-11-04 DIAGNOSIS — I25810 Atherosclerosis of coronary artery bypass graft(s) without angina pectoris: Secondary | ICD-10-CM | POA: Diagnosis not present

## 2018-11-04 DIAGNOSIS — I152 Hypertension secondary to endocrine disorders: Secondary | ICD-10-CM

## 2018-11-04 MED ORDER — ASPIRIN EC 81 MG PO TBEC: 81.0000 mg | DELAYED_RELEASE_TABLET | Freq: Every day | ORAL | 3 refills | Status: AC

## 2018-11-04 NOTE — Progress Notes (Signed)
onths

## 2018-11-06 ENCOUNTER — Other Ambulatory Visit: Payer: Self-pay | Admitting: Cardiology

## 2018-11-11 DIAGNOSIS — H401132 Primary open-angle glaucoma, bilateral, moderate stage: Secondary | ICD-10-CM | POA: Diagnosis not present

## 2018-11-11 DIAGNOSIS — E119 Type 2 diabetes mellitus without complications: Secondary | ICD-10-CM | POA: Diagnosis not present

## 2018-11-11 DIAGNOSIS — H43813 Vitreous degeneration, bilateral: Secondary | ICD-10-CM | POA: Diagnosis not present

## 2018-11-25 ENCOUNTER — Other Ambulatory Visit: Payer: Self-pay

## 2018-11-25 ENCOUNTER — Encounter (INDEPENDENT_AMBULATORY_CARE_PROVIDER_SITE_OTHER): Payer: Medicare Other | Admitting: Ophthalmology

## 2018-11-25 DIAGNOSIS — H353122 Nonexudative age-related macular degeneration, left eye, intermediate dry stage: Secondary | ICD-10-CM

## 2018-11-25 DIAGNOSIS — I10 Essential (primary) hypertension: Secondary | ICD-10-CM | POA: Diagnosis not present

## 2018-11-25 DIAGNOSIS — H35033 Hypertensive retinopathy, bilateral: Secondary | ICD-10-CM | POA: Diagnosis not present

## 2018-11-25 DIAGNOSIS — H353211 Exudative age-related macular degeneration, right eye, with active choroidal neovascularization: Secondary | ICD-10-CM | POA: Diagnosis not present

## 2018-11-25 DIAGNOSIS — H43813 Vitreous degeneration, bilateral: Secondary | ICD-10-CM

## 2018-12-05 ENCOUNTER — Other Ambulatory Visit: Payer: Self-pay | Admitting: Family Medicine

## 2018-12-22 ENCOUNTER — Other Ambulatory Visit: Payer: Self-pay | Admitting: Cardiology

## 2018-12-22 DIAGNOSIS — I10 Essential (primary) hypertension: Secondary | ICD-10-CM

## 2018-12-23 ENCOUNTER — Encounter (INDEPENDENT_AMBULATORY_CARE_PROVIDER_SITE_OTHER): Payer: Medicare Other | Admitting: Ophthalmology

## 2018-12-23 ENCOUNTER — Other Ambulatory Visit: Payer: Self-pay

## 2018-12-23 DIAGNOSIS — I10 Essential (primary) hypertension: Secondary | ICD-10-CM | POA: Diagnosis not present

## 2018-12-23 DIAGNOSIS — H353122 Nonexudative age-related macular degeneration, left eye, intermediate dry stage: Secondary | ICD-10-CM | POA: Diagnosis not present

## 2018-12-23 DIAGNOSIS — H353211 Exudative age-related macular degeneration, right eye, with active choroidal neovascularization: Secondary | ICD-10-CM | POA: Diagnosis not present

## 2018-12-23 DIAGNOSIS — H43813 Vitreous degeneration, bilateral: Secondary | ICD-10-CM

## 2018-12-23 DIAGNOSIS — H35033 Hypertensive retinopathy, bilateral: Secondary | ICD-10-CM | POA: Diagnosis not present

## 2019-01-04 ENCOUNTER — Encounter: Payer: Self-pay | Admitting: Family Medicine

## 2019-01-04 ENCOUNTER — Other Ambulatory Visit: Payer: Self-pay

## 2019-01-04 ENCOUNTER — Other Ambulatory Visit: Payer: Self-pay | Admitting: Cardiology

## 2019-01-04 MED ORDER — ISOSORBIDE MONONITRATE ER 60 MG PO TB24: ORAL_TABLET | ORAL | 2 refills | Status: AC

## 2019-01-13 ENCOUNTER — Other Ambulatory Visit: Payer: Self-pay | Admitting: Cardiology

## 2019-01-13 NOTE — Telephone Encounter (Signed)
Rx request sent to pharmacy.  

## 2019-01-18 ENCOUNTER — Other Ambulatory Visit: Payer: Self-pay | Admitting: Cardiology

## 2019-01-21 ENCOUNTER — Encounter (INDEPENDENT_AMBULATORY_CARE_PROVIDER_SITE_OTHER): Payer: Medicare Other | Admitting: Ophthalmology

## 2019-01-21 DIAGNOSIS — H353122 Nonexudative age-related macular degeneration, left eye, intermediate dry stage: Secondary | ICD-10-CM

## 2019-01-21 DIAGNOSIS — I10 Essential (primary) hypertension: Secondary | ICD-10-CM | POA: Diagnosis not present

## 2019-01-21 DIAGNOSIS — H353211 Exudative age-related macular degeneration, right eye, with active choroidal neovascularization: Secondary | ICD-10-CM | POA: Diagnosis not present

## 2019-01-21 DIAGNOSIS — H35033 Hypertensive retinopathy, bilateral: Secondary | ICD-10-CM | POA: Diagnosis not present

## 2019-01-21 DIAGNOSIS — H43813 Vitreous degeneration, bilateral: Secondary | ICD-10-CM

## 2019-01-21 LAB — HM DIABETES EYE EXAM

## 2019-01-24 ENCOUNTER — Other Ambulatory Visit: Payer: Self-pay | Admitting: Family Medicine

## 2019-01-28 ENCOUNTER — Other Ambulatory Visit: Payer: Self-pay | Admitting: Family Medicine

## 2019-01-28 NOTE — Telephone Encounter (Signed)
  LAST APPOINTMENT DATE: @@LASTENCT @  NEXT APPOINTMENT DATE:@4 /05/2019  MEDICATION: metFORMIN (GLUCOPHAGE) 500 MG tablet  Patient states he is out this and the pharmacy will not refill because there was a mix up on how often patient needs to take it.   PHARMACY: [facility] [place]  **Let patient know to contact pharmacy at the end of the day to make sure medication is ready. **  ** Please notify patient to allow 48-72 hours to process**  **Encourage patient to contact the pharmacy for refills or they can request refills through [facility]**  CLINICAL FILLS OUT ALL BELOW:   LAST REFILL:  QTY:  REFILL DATE:    OTHER COMMENTS:    Okay for refill?  Please advise

## 2019-01-28 NOTE — Telephone Encounter (Signed)
Called pt and confirmed that he is taking BID.

## 2019-02-10 DIAGNOSIS — Z951 Presence of aortocoronary bypass graft: Secondary | ICD-10-CM | POA: Diagnosis not present

## 2019-02-10 DIAGNOSIS — H3530 Unspecified macular degeneration: Secondary | ICD-10-CM | POA: Diagnosis not present

## 2019-02-10 DIAGNOSIS — C9141 Hairy cell leukemia, in remission: Secondary | ICD-10-CM | POA: Diagnosis not present

## 2019-02-10 DIAGNOSIS — L988 Other specified disorders of the skin and subcutaneous tissue: Secondary | ICD-10-CM | POA: Diagnosis not present

## 2019-02-10 DIAGNOSIS — I2510 Atherosclerotic heart disease of native coronary artery without angina pectoris: Secondary | ICD-10-CM | POA: Diagnosis not present

## 2019-02-10 DIAGNOSIS — E1165 Type 2 diabetes mellitus with hyperglycemia: Secondary | ICD-10-CM | POA: Diagnosis not present

## 2019-02-10 DIAGNOSIS — M1711 Unilateral primary osteoarthritis, right knee: Secondary | ICD-10-CM | POA: Diagnosis not present

## 2019-02-15 DIAGNOSIS — H401132 Primary open-angle glaucoma, bilateral, moderate stage: Secondary | ICD-10-CM | POA: Diagnosis not present

## 2019-02-15 DIAGNOSIS — Z961 Presence of intraocular lens: Secondary | ICD-10-CM | POA: Diagnosis not present

## 2019-02-18 ENCOUNTER — Other Ambulatory Visit: Payer: Self-pay

## 2019-02-18 ENCOUNTER — Encounter (INDEPENDENT_AMBULATORY_CARE_PROVIDER_SITE_OTHER): Payer: Medicare Other | Admitting: Ophthalmology

## 2019-02-18 DIAGNOSIS — H353122 Nonexudative age-related macular degeneration, left eye, intermediate dry stage: Secondary | ICD-10-CM

## 2019-02-18 DIAGNOSIS — H353211 Exudative age-related macular degeneration, right eye, with active choroidal neovascularization: Secondary | ICD-10-CM

## 2019-02-18 DIAGNOSIS — H43813 Vitreous degeneration, bilateral: Secondary | ICD-10-CM

## 2019-02-18 DIAGNOSIS — I10 Essential (primary) hypertension: Secondary | ICD-10-CM

## 2019-02-18 DIAGNOSIS — H35033 Hypertensive retinopathy, bilateral: Secondary | ICD-10-CM

## 2019-03-08 DIAGNOSIS — Z0120 Encounter for dental examination and cleaning without abnormal findings: Secondary | ICD-10-CM | POA: Diagnosis not present

## 2019-03-17 ENCOUNTER — Encounter: Payer: Self-pay | Admitting: Family Medicine

## 2019-03-17 ENCOUNTER — Ambulatory Visit (INDEPENDENT_AMBULATORY_CARE_PROVIDER_SITE_OTHER): Payer: Medicare Other | Admitting: Family Medicine

## 2019-03-17 ENCOUNTER — Ambulatory Visit: Payer: Medicare Other | Attending: Internal Medicine

## 2019-03-17 ENCOUNTER — Other Ambulatory Visit: Payer: Self-pay

## 2019-03-17 VITALS — Temp 96.4°F | Ht 70.0 in | Wt 208.0 lb

## 2019-03-17 DIAGNOSIS — Z20822 Contact with and (suspected) exposure to covid-19: Secondary | ICD-10-CM

## 2019-03-17 DIAGNOSIS — R109 Unspecified abdominal pain: Secondary | ICD-10-CM | POA: Diagnosis not present

## 2019-03-17 DIAGNOSIS — R197 Diarrhea, unspecified: Secondary | ICD-10-CM | POA: Diagnosis not present

## 2019-03-17 NOTE — Progress Notes (Signed)
Patient: [name] MRN: [id number] DOB: 1934/11/04 PCP: Marin Olp, MD     I connected with [place] Desanctis on 03/17/19 at 10:16am by a video enabled telemedicine application and verified that I am speaking with the correct person using two identifiers.  Location patient: Home Location provider: [facility], Office Persons participating in this virtual visit: Samaj Doehring and Dr. Rogers Blocker  I discussed the limitations of evaluation and management by telemedicine and the availability of in person appointments. The patient expressed understanding and agreed to proceed.   Subjective:  Chief Complaint  Patient presents with  . Abdominal Pain    Starting a couple of weeks ago.  . Nausea  . Diarrhea    Taking immodium AD  . Chills    HPI: The patient is a 85 y.o. male who presents today for complaints of abdominal pain x a few weeks, chills, nausea and diarrhea. He states he has had a very "sensitive stomach" that is hurting more. This started two weeks ago with associated increased gas, diarrhea, and just not feeling well. He does have IBS and gets diarrhea and states this diarrhea is not normal for him. The stomach pain went away, but then it came back. He is tired and just wants to sleep a lot. Pain is all across his mid section of his stomach. He also has tightens that is relieved by gas. Pain is 5/10 and is constant. Described as dull in nature. At times, radiates "northward". Coffee and alcohol tend to make it worse. Spicy foods don't bother it. He has not tried any over the counter medication. Cant think of anything that makes it better. He has had some nausea and dry heaves very rarely. He is on prilosec 40mg , but takes this in the evening. He does not take any over the counter NSAIDs. Does not hurt to really press on his stomach.   He has diarrhea about 2-3 times in the AM and some episodes at night. He states since he is not eating much, it has gotten better. He is trying  to drink a lot of water and is able to keep this down. No fevers/blood in stool. No black/tarry stool.  No travel overseas, city water. He has 2 cats. He has taken his first dose of imodium this AM. Has not taken over the past too weeks.   He also has a mild cough with some mild production. No problems breathing/sob/orthopnea.   Denies sick contacts and hasn't really been around anyone. He got his first covid shot last week, but had symptoms before the shot.   Review of Systems  Constitutional: Positive for chills and fatigue. Negative for diaphoresis and fever.  HENT: Positive for sneezing. Negative for congestion.   Respiratory: Positive for cough. Negative for chest tightness, shortness of breath and wheezing.   Cardiovascular: Negative for chest pain, palpitations and leg swelling.  Gastrointestinal: Positive for abdominal pain, diarrhea and nausea. Negative for blood in stool, constipation and vomiting.  Genitourinary: Negative for difficulty urinating.  Skin: Negative for rash.  Neurological: Negative for syncope and headaches.    Allergies Patient is allergic to clindamycin.  Past Medical History Patient  has a past medical history of Adenomatous colon polyp, Anxiety, Arthritis, Bladder neck contracture, Cataract immature (BILATERAL), Coronary artery disease (CARDIOLOGIST- DR Martinique-  LAST VISIT NOTE 12-20-2010 IN EPIC AND W/ CHART), Diabetes mellitus without complication ([place]), DIVERTICULOSIS, COLON (09/05/2003), EUSTACHIAN TUBE DYSFUNCTION, BILATERAL (06/22/2008), EXTERNAL HEMORRHOIDS (12/14/2007), Frequency of urination, GERD (gastroesophageal reflux  disease), Glaucoma (BILATERAL), H/O hiatal hernia, Hairy cell leukemia ([place]), History of atherosclerotic cardiovascular disease, History of kidney stones, History of myocardial infarction (2006   S/P CABG), History of prostate cancer (S/P PROSTATECTOMY-  NO RECURRENCE), Hyperlipidemia, Hypertension, Hypertriglyceridemia, Insomnia, Macular  degeneration, Nocturia, Raynaud's phenomenon, Right arm cellulitis (04/02/12), S/P CABG x 4 (2006), Stable angina ([place]), and Urgency of urination.  Surgical History Patient  has a past surgical history that includes Debridement tennis elbow (2002); Robot assisted laparoscopic radical prostatectomy (05-11-2006); Cardiac catheterization (07-30-2006  DR Martinique); Cardiac catheterization (04-23-2004); Laparoscopic cholecystectomy (02-05-2005); Coronary artery bypass graft (04-25-2004  X4 VESSELS); Cystoscopy (04/30/2011); and Balloon dilation (04/30/2011).  Family History Pateint's family history includes Cancer in his sister; Heart disease in his sister; Parkinsonism in his father.  Social History Patient  reports that he quit smoking about 56 years ago. His smoking use included cigarettes and cigars. He has a 0.75 pack-year smoking history. He has never used smokeless tobacco. He reports current alcohol use of about 14.0 standard drinks of alcohol per week. He reports that he does not use drugs.    Objective: Vitals:   03/17/19 1000  Temp: (!) 96.4 F (35.8 C)  Weight: 208 lb (94.3 kg)  Height: 5\' 10"  (1.778 m)    Body mass index is 29.84 kg/m.  Physical Exam Vitals reviewed.  Constitutional:      General: He is not in acute distress.    Appearance: He is well-developed and normal weight. He is not ill-appearing.  HENT:     Head: Normocephalic and atraumatic.  Pulmonary:     Effort: Pulmonary effort is normal.  Abdominal:     Tenderness: There is abdominal tenderness (able to push on stomach with not much pain, but tender all across midsection) in the periumbilical area. There is no guarding or rebound.  Neurological:     General: No focal deficit present.     Mental Status: He is alert and oriented to person, place, and time.  Psychiatric:        Mood and Affect: Mood normal.        Behavior: Behavior normal.        Assessment/plan: 1. Stomach pain Advised testing for covid  with GI symptoms, cough. Information and contact number given to set this up today. Continue isolation until this is back.   Want him to change his prilosec to the AM and can try tums prn. Discussed GERD/gastritis diet as well. On no NSAIDS. Stop alcohol/cut back on coffee. Also want him to look up fodmop diet for gas. Precautions given for PUD/diverticulitis, acute abdominal pain. If not better and covid test negative discussed he needs to come back in on Monday for physical exam and labs if warranted as differential is large and hard to address via computer screen. He is in agreement to this plan.     2. Diarrhea, unspecified type x2 weeks. Stool studies ordered. States out of character of his IBS, but on differential. Continue with water and discussed if covid test negative would like him to come into office on Monday or next week for physical exam and more labs.  - Stool culture; Future - Gastrointestinal Pathogen Panel PCR; Future - Ova and parasite examination; Future - Fecal occult blood, imunochemical; Future - Fecal lactoferrin, quant; Future - Clostridium difficile Toxin B, Qualitative, Real-Time PCR; Future     Return if symptoms worsen or fail to improve, for call monday for in person if not better. .  Records requested if  needed. Time spent with patient: 35 minutes, of which >50% was spent in obtaining information about his symptoms, reviweing his previous labs, evaluations, and treatments, counseling him about his conditions (please see discussed topics above), and developing a plan to further investigate it; he had a number of questions which I addressed.    Orma Flaming, MD [place]  03/17/2019

## 2019-03-17 NOTE — Patient Instructions (Addendum)
-look up FODMOP diet for gas.  -starting you on medication to help possible gastritis.  -I want you to get tested for covid.   Covid testing Text COVID to [phone] OR email [email or web address] to set up appointment. You no longer need an order and can just set up appointment to be tested. If no phone or access to internet phone number to call and set this up is: [phone]  Diarrhea -test for covid -come get stool study kit -can start imodium prn and make sure drinking water     Gastritis, Adult Gastritis is inflammation of the stomach. There are two kinds of gastritis:  Acute gastritis. This kind develops suddenly.  Chronic gastritis. This kind is much more common and lasts for a long time. Gastritis happens when the lining of the stomach becomes weak or gets damaged. Without treatment, gastritis can lead to stomach bleeding and ulcers. What are the causes? This condition may be caused by:  An infection.  Drinking too much alcohol.  Certain medicines. These include steroids, antibiotics, and some over-the-counter medicines, such as aspirin or ibuprofen.  Having too much acid in the stomach.  A disease of the intestines or stomach.  Stress.  An allergic reaction.  Crohn's disease.  Some cancer treatments (radiation). Sometimes the cause of this condition is not known. What are the signs or symptoms? Symptoms of this condition include:  Pain or a burning sensation in the upper abdomen.  Nausea.  Vomiting.  An uncomfortable feeling of fullness after eating.  Weight loss.  Bad breath.  Blood in your vomit or stools. In some cases, there are no symptoms. How is this diagnosed? This condition may be diagnosed with:  Your medical history and a description of your symptoms.  A physical exam.  Tests. These can include: ? Blood tests. ? Stool tests. ? A test in which a thin, flexible instrument with a light and a camera is passed down the esophagus and  into the stomach (upper endoscopy). ? A test in which a sample of tissue is taken for testing (biopsy). How is this treated? This condition may be treated with medicines. The medicines that are used vary depending on the cause of the gastritis:  If the condition is caused by a bacterial infection, you may be given antibiotic medicines.  If the condition is caused by too much acid in the stomach, you may be given medicines called H2 blockers, proton pump inhibitors, or antacids. Treatment may also involve stopping the use of certain medicines, such as aspirin, ibuprofen, or other NSAIDs. Follow these instructions at home: Medicines  Take over-the-counter and prescription medicines only as told by your health care provider.  If you were prescribed an antibiotic medicine, take it as told by your health care provider. Do not stop taking the antibiotic even if you start to feel better. Eating and drinking   Eat small, frequent meals instead of large meals.  Avoid foods and drinks that make your symptoms worse.  Drink enough fluid to keep your urine pale yellow. Alcohol use  Do not drink alcohol if: ? Your health care provider tells you not to drink. ? You are pregnant, may be pregnant, or are planning to become pregnant.  If you drink alcohol: ? Limit your use to:  0-1 drink a day for women.  0-2 drinks a day for men. ? Be aware of how much alcohol is in your drink. In the [place] one drink equals one 12 oz bottle of beer (355  mL), one 5 oz glass of wine (148 mL), or one 1 oz glass of hard liquor (44 mL). General instructions  Talk with your health care provider about ways to manage stress, such as getting regular exercise or practicing deep breathing, meditation, or yoga.  Do not use any products that contain nicotine or tobacco, such as cigarettes and e-cigarettes. If you need help quitting, ask your health care provider.  Keep all follow-up visits as told by your health care  provider. This is important. Contact a health care provider if:  Your symptoms get worse.  Your symptoms return after treatment. Get help right away if:  You vomit blood or material that looks like coffee grounds.  You have black or dark red stools.  You are unable to keep fluids down.  Your abdominal pain gets worse.  You have a fever.  You do not feel better after one week. Summary  Gastritis is inflammation of the lining of the stomach that can occur suddenly (acute) or develop slowly over time (chronic).  This condition is diagnosed with a medical history, a physical exam, or tests.  This condition may be treated with medicines to treat infection or medicines to reduce the amount of acid in your stomach.  Follow your health care provider's instructions about taking medicines, making changes to your diet, and knowing when to call for help. This information is not intended to replace advice given to you by your health care provider. Make sure you discuss any questions you have with your health care provider. Document Revised: 05/19/2017 Document Reviewed: 05/19/2017 Elsevier Patient Education  [place].

## 2019-03-18 ENCOUNTER — Encounter (INDEPENDENT_AMBULATORY_CARE_PROVIDER_SITE_OTHER): Payer: Medicare Other | Admitting: Ophthalmology

## 2019-03-18 LAB — NOVEL CORONAVIRUS, NAA: SARS-CoV-2, NAA: NOT DETECTED

## 2019-03-22 ENCOUNTER — Encounter (INDEPENDENT_AMBULATORY_CARE_PROVIDER_SITE_OTHER): Payer: Medicare Other | Admitting: Ophthalmology

## 2019-03-22 ENCOUNTER — Other Ambulatory Visit: Payer: Self-pay

## 2019-03-22 DIAGNOSIS — H353122 Nonexudative age-related macular degeneration, left eye, intermediate dry stage: Secondary | ICD-10-CM | POA: Diagnosis not present

## 2019-03-22 DIAGNOSIS — I10 Essential (primary) hypertension: Secondary | ICD-10-CM | POA: Diagnosis not present

## 2019-03-22 DIAGNOSIS — H35033 Hypertensive retinopathy, bilateral: Secondary | ICD-10-CM

## 2019-03-22 DIAGNOSIS — H353211 Exudative age-related macular degeneration, right eye, with active choroidal neovascularization: Secondary | ICD-10-CM

## 2019-03-22 DIAGNOSIS — H43813 Vitreous degeneration, bilateral: Secondary | ICD-10-CM

## 2019-04-08 DIAGNOSIS — C9141 Hairy cell leukemia, in remission: Secondary | ICD-10-CM | POA: Diagnosis not present

## 2019-04-14 DIAGNOSIS — Z951 Presence of aortocoronary bypass graft: Secondary | ICD-10-CM | POA: Diagnosis not present

## 2019-04-14 DIAGNOSIS — I951 Orthostatic hypotension: Secondary | ICD-10-CM | POA: Diagnosis not present

## 2019-04-14 DIAGNOSIS — C9142 Hairy cell leukemia, in relapse: Secondary | ICD-10-CM | POA: Diagnosis not present

## 2019-04-14 DIAGNOSIS — K8689 Other specified diseases of pancreas: Secondary | ICD-10-CM | POA: Diagnosis not present

## 2019-04-14 DIAGNOSIS — Z7984 Long term (current) use of oral hypoglycemic drugs: Secondary | ICD-10-CM | POA: Diagnosis not present

## 2019-04-14 DIAGNOSIS — H3560 Retinal hemorrhage, unspecified eye: Secondary | ICD-10-CM | POA: Diagnosis not present

## 2019-04-14 DIAGNOSIS — I2510 Atherosclerotic heart disease of native coronary artery without angina pectoris: Secondary | ICD-10-CM | POA: Diagnosis not present

## 2019-04-14 DIAGNOSIS — Z87891 Personal history of nicotine dependence: Secondary | ICD-10-CM | POA: Diagnosis not present

## 2019-04-14 DIAGNOSIS — C9140 Hairy cell leukemia not having achieved remission: Secondary | ICD-10-CM | POA: Diagnosis not present

## 2019-04-14 DIAGNOSIS — H3530 Unspecified macular degeneration: Secondary | ICD-10-CM | POA: Diagnosis not present

## 2019-04-14 DIAGNOSIS — M1711 Unilateral primary osteoarthritis, right knee: Secondary | ICD-10-CM | POA: Diagnosis not present

## 2019-04-14 DIAGNOSIS — D696 Thrombocytopenia, unspecified: Secondary | ICD-10-CM | POA: Diagnosis not present

## 2019-04-14 DIAGNOSIS — E119 Type 2 diabetes mellitus without complications: Secondary | ICD-10-CM | POA: Diagnosis not present

## 2019-04-18 ENCOUNTER — Encounter: Payer: Self-pay | Admitting: Family Medicine

## 2019-04-18 ENCOUNTER — Ambulatory Visit (INDEPENDENT_AMBULATORY_CARE_PROVIDER_SITE_OTHER): Payer: Medicare Other | Admitting: Family Medicine

## 2019-04-18 ENCOUNTER — Other Ambulatory Visit: Payer: Self-pay

## 2019-04-18 ENCOUNTER — Telehealth: Payer: Self-pay | Admitting: Family Medicine

## 2019-04-18 ENCOUNTER — Telehealth: Payer: Self-pay | Admitting: *Deleted

## 2019-04-18 ENCOUNTER — Ambulatory Visit (HOSPITAL_COMMUNITY)
Admission: RE | Admit: 2019-04-18 | Discharge: 2019-04-18 | Disposition: A | Discharge status: Home / Self Care | Payer: Medicare Other | Source: Ambulatory Visit | Attending: Family Medicine | Admitting: Family Medicine

## 2019-04-18 VITALS — BP 128/82 | Temp 97.6°F | Resp 73 | Ht 70.0 in | Wt 199.0 lb

## 2019-04-18 DIAGNOSIS — E119 Type 2 diabetes mellitus without complications: Secondary | ICD-10-CM | POA: Diagnosis not present

## 2019-04-18 DIAGNOSIS — C9142 Hairy cell leukemia, in relapse: Secondary | ICD-10-CM | POA: Diagnosis not present

## 2019-04-18 DIAGNOSIS — I152 Hypertension secondary to endocrine disorders: Secondary | ICD-10-CM

## 2019-04-18 DIAGNOSIS — E1169 Type 2 diabetes mellitus with other specified complication: Secondary | ICD-10-CM

## 2019-04-18 DIAGNOSIS — E1159 Type 2 diabetes mellitus with other circulatory complications: Secondary | ICD-10-CM

## 2019-04-18 DIAGNOSIS — I10 Essential (primary) hypertension: Secondary | ICD-10-CM

## 2019-04-18 DIAGNOSIS — E785 Hyperlipidemia, unspecified: Secondary | ICD-10-CM

## 2019-04-18 DIAGNOSIS — R011 Cardiac murmur, unspecified: Secondary | ICD-10-CM

## 2019-04-18 DIAGNOSIS — R109 Unspecified abdominal pain: Secondary | ICD-10-CM

## 2019-04-18 DIAGNOSIS — I25810 Atherosclerosis of coronary artery bypass graft(s) without angina pectoris: Secondary | ICD-10-CM

## 2019-04-18 MED ORDER — IOHEXOL 9 MG/ML PO SOLN
ORAL | Status: AC
  Filled 2019-04-18: qty 1000

## 2019-04-18 MED ORDER — IOHEXOL 300 MG/ML  SOLN
100.0000 mL | Freq: Once | INTRAMUSCULAR | Status: AC | PRN
  Administered 2019-04-18: 16:00:00 100 mL via INTRAVENOUS

## 2019-04-18 MED ORDER — IOHEXOL 9 MG/ML PO SOLN
500.0000 mL | ORAL | Status: AC
  Administered 2019-04-18: 13:00:00 1000 mL via ORAL

## 2019-04-18 MED ORDER — SODIUM CHLORIDE (PF) 0.9 % IJ SOLN
INTRAMUSCULAR | Status: AC
  Filled 2019-04-18: qty 50

## 2019-04-18 NOTE — Progress Notes (Signed)
Phone [phone] In person visit   Subjective:   [name] is a 85 y.o. year old very pleasant male patient who presents for/with See problem oriented charting Chief Complaint  Patient presents with  . Hyperlipidemia  . Hypertension  . Diabetes   This visit occurred during the SARS-CoV-2 public health emergency.  Safety protocols were in place, including screening questions prior to the visit, additional usage of staff PPE, and extensive cleaning of exam room while observing appropriate contact time as indicated for disinfecting solutions.   Past Medical History-  Patient Active Problem List   Diagnosis Date Noted  . Diabetes mellitus type II, controlled ([place]) 07/31/2016    Priority: High  . Hairy cell leukemia ([place]) 04/01/2012    Priority: High  . Pancytopenia ([place]) 03/25/2012    Priority: High  . Coronary artery disease     Priority: High  . History of prostate cancer 05/15/2015    Priority: Medium  . GERD (gastroesophageal reflux disease) 04/25/2015    Priority: Medium  . IBS (irritable bowel syndrome) 07/21/2012    Priority: Medium  . Hypertension associated with diabetes ([place])     Priority: Medium  . Hyperlipidemia associated with type 2 diabetes mellitus ([place]) 04/20/2006    Priority: Medium  . Muscle cramping 01/23/2015    Priority: Low  . Raynaud's phenomenon 02/10/2012    Priority: Low  . Essential tremor 12/20/2010    Priority: Low  . Personal history of colonic polyps 12/15/2007    Priority: Low  . Actinic keratosis 02/21/2017  . 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 aspirin EC 81 MG tablet Take 1 tablet (81 mg total) by mouth daily. 90 tablet 3  . atorvastatin (LIPITOR) 20 MG tablet Take 1 tablet by mouth once daily 90 tablet 0  . blood glucose meter kit and supplies Use to check blood sugar 5 times a week. Dx Code E11.65. 1 each 0  .  dorzolamide (TRUSOPT) 2 % ophthalmic solution Place 1 drop into both eyes 2 (two) times daily. From optho    . dorzolamide-timolol (COSOPT) 22.3-6.8 MG/ML ophthalmic solution INSTILL 1 DROP INTO EACH EYE TWICE DAILY    . isosorbide mononitrate (IMDUR) 60 MG 24 hr tablet TAKE 1 TABLET BY MOUTH ONCE DAILY 90 tablet 2  . Lancets (ONETOUCH ULTRASOFT) lancets Use to test blood sugar 5 times a week E11.65 100 each 4  . latanoprost (XALATAN) 0.005 % ophthalmic solution Place 1 drop into both eyes at bedtime.     [name] losartan (COZAAR) 25 MG tablet Take 1 tablet by mouth once daily 90 tablet 0  . LUTEIN PO Take 1 tablet by mouth daily.    . metFORMIN (GLUCOPHAGE) 500 MG tablet Take 1 tablet by mouth twice daily 180 tablet 0  . metoprolol succinate (TOPROL-XL) 50 MG 24 hr tablet TAKE 1 TABLET BY MOUTH ONCE DAILY APPOINTMENT REQUIRED FOR FUTURE REFILLS 30 tablet 10  . Multiple Vitamin (MULTIVITAMIN) tablet Take 1 tablet by mouth daily.    . nitroGLYCERIN (NITROSTAT) 0.4 MG SL tablet Place 1 tablet (0.4 mg total) under the tongue every 5 (five) minutes as needed for chest pain (x 3 doses max). 25 tablet 3  . omeprazole (PRILOSEC) 40 MG capsule TAKE 1 CAPSULE BY MOUTH IN THE EVENING 90 capsule 0  . ONETOUCH VERIO test strip USE TO CHECK BLOOD GLUCOSE 5 TIMES PER WEEK 100 each 0  No current facility-administered medications for this visit.     Objective:  BP 128/82   Temp 97.6 F (36.4 C) (Temporal)   Resp (!) 73   Ht _0  (1.778 m)   Wt 199 lb (90.3 kg)   SpO2 98%   BMI 28.55 kg/m  Gen: NAD, resting comfortably CV: RRR no murmurs rubs or gallops Lungs: CTAB no crackles, wheeze, rhonchi Abdomen: soft/nontender other than pain with palpation in RUQ as well as on lateral portion of right abdomen spanning RUQ and RLQ/nondistended/normal bowel sounds. No rebound or guarding.  Ext: no edema Skin: warm, dry     Assessment and Plan  # Right side abdominal pain  S:off and on for over a year (states  a couple years comes and goes and worse with alcohol). Has had increased issues in the last week. Right now pain is 2/10 but can get to 10/10- can be a sharp pain. Increased with change in position. Has history of urinary issues. Followed by urology. Denies any change in bowel movement. Has had decreased appetite.   2 weeks ago got out of bed and felt like someone stabbed him with bayonette- got up and walked around and seemed to improve slightly. Since that time as low as 3/10 but can easily get to 5-6/10 and occasionally 10/10. History of gallbladder removed.   On 04/08/19 at [facility] largely normal including all LFTs.   Notes has always had a lot of gaseous distension for years - has been worse.   Still having diarrhea now for 6 weeks at least about 2-3 a day (usually would be 2 a day years ago) . Some dizziness A/P: 85 year old male with nearly 6 weeks of loose stools and 2 years of right sided abdominal pain (now worsening). Has had cholecystectomy. With duration of symptoms and worsening- I believe CT scan of abdomen pelvis is prudent.  Just had creatinine done on 04/08/2019 and I will have the team abstract this.  With diarrhea-instructed patient to complete stool testing as previously recommended by Dr. Hermenia Bers does have some solid stool followed by fair amount of loose stool and he is to collect the loose portion only.  Patient also noted to have a new murmur-could be from mild dehydration with ongoing loose stools but we will get echocardiogram  I am being more aggressive today with both echocardiogram and CT scan that I may normally be-that is with understanding that patient may be starting treatment soon for hairy cell leukemia as below  # Hypertension  S:controlled on  imdur 60 mg, losartan 25 mg, toprol 63m XR.  A/P:  Stable. Continue current medications.    # Diabetes  S: has been  controlled on metformin 508mtwice a day. A1c goal <8 given age   Home #s 102-low  200s- 90 day average right around 150 Lab Results  Component Value Date   HGBA1C 6.6 04/22/2018   HGBA1C 6.4 12/17/2017   HGBA1C 6.8 05/14/2017  A/P: a1c of 6.7 in January- sugars above seem slightly higher but should still be below 8- we will plan on 3 month follow up for repeat exam and a1c  # Hyperlipidemia/CAD S: Lipids on last check reasonably controlled on atorvastatin 2020maily.  Denies chest pain or shortness of breath-compliant with aspirin as well Lab Results  Component Value Date   CHOL 145 04/22/2018   HDL 52 04/22/2018   LDLCALC 65 04/22/2018   LDLDIRECT 78.0 12/24/2017   TRIG 300 (A) 04/22/2018  CHOLHDL 3 12/24/2017   A/P: Hopefully controlled today in regards to lipids - we opted to wait until next visit to draw blood as he will have a lot of bloodwork drawn with [facility]   CAD is asymptomatic as long as on Imdur (therefore angina controlled) -continue current medications. Last visit with Dr. 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  Follows with Dr. Linus Orn.  Bone marrow biopsy was attempted and he was told had recurred.  A/P: Patient has close follow up with [facility] later this week for treatment plan- apparently there is an oral option potentially but very expensive. Offered emotional support and glad he is in good hands with [facility].   Recommended follow up: Return in about 3 months (around 07/18/2019) for follow up- or sooner if needed. Future Appointments  Date Time Provider [place]  04/18/2019  2:30 PM [facility] 2 [facility] [place]  04/22/2019  8:15 AM Hayden Pedro, MD [facility] None  07/21/2019 11:20 AM Yong Channel Brayton Mars, MD [id number]   Lab/Order associations:   ICD-10-CM   1. Right sided abdominal pain  R10.9 CT Abdomen Pelvis W Contrast    CANCELED: CT Abdomen Pelvis W Contrast  2. Newly recognized heart murmur  R01.1 ECHOCARDIOGRAM COMPLETE    CANCELED: ECHOCARDIOGRAM COMPLETE   3. Hairy cell leukemia, in relapse ([place])  C91.42   4. Controlled type 2 diabetes mellitus without complication, without long-term current use of insulin ([facility])  E11.9   5. Coronary artery disease involving autologous vein coronary bypass graft without angina pectoris  I25.810   6. Hypertension associated with diabetes ([place])  E11.59    I10   7. Hyperlipidemia associated with type 2 diabetes mellitus ([place])  E11.69    E78.5    Return precautions advised.  Garret Reddish, MD

## 2019-04-18 NOTE — Patient Instructions (Addendum)
We will call you within two weeks about your referral for echocardiogram of the heart for new murmur though it may simply be from dehydration from loose stools. If you do not hear within 3 weeks, give [place] a call.   Also pick up stool kit from the lab for ongoing loose stools- only collect liquid/loose stools- do not submit any solid stools  We will call you within two days about your referral for CT scan. If you do not hear within 3 days, give [place] a call. If you have new or worsening symptoms please let us know or seek care.   Sorry to hear about the hairy cell situation- we are certainly rooting for you!   Recommended follow up: Return in about 3 months (around 07/18/2019) for follow up- or sooner if needed.

## 2019-04-18 NOTE — Telephone Encounter (Signed)
CT called requesting iStat Creatinine lab for pt. Please advise.

## 2019-04-18 NOTE — Telephone Encounter (Signed)
[name] Channel please see report of CT Abdomen and Pelvis from today. Radiology called regarding impression.

## 2019-04-18 NOTE — Telephone Encounter (Signed)
Echo - [id number] - no auth required.   Order Summary  Health Plan: Solution Name: [facility] Scheduled Date of Service: 04/18/2019 Member Information [name], [name] Member #: [id number] [address] [place], [place], [id number] Date Of Birth: [birth year] Phone: [phone]    Please note that as of January 14, 2019, the health plan no longer requires a pre-authorization for Advanced Diagnostic Imaging and Cardiac services for Medicare members for services scheduled on or after January 14, 2019. If you have questions about eligibility, benefits or claims, please refer to the customer service number on the member's identification card.

## 2019-04-18 NOTE — Telephone Encounter (Signed)
Received call from [facility] in Radiology. He wanted to make sure [name] Channel saw pt's report regarding the impression comments. Told Carloyn Manner thank you I will make sure he sees it.

## 2019-04-18 NOTE — Telephone Encounter (Signed)
Called and gave lab and sent via fax copy. To lisa at CT

## 2019-04-18 NOTE — Telephone Encounter (Signed)
I am reviewing this tonight- I am going to try to call patient in AM as it was too late when I had a chance to review full report (clinic ran pretty late today)

## 2019-04-19 NOTE — Telephone Encounter (Signed)
See result note- talked with him today

## 2019-04-19 NOTE — Addendum Note (Signed)
Addended by: Christiana Fuchs on: 04/19/2019 10:16 AM   Modules accepted: Orders

## 2019-04-20 ENCOUNTER — Telehealth: Payer: Self-pay | Admitting: Family Medicine

## 2019-04-20 DIAGNOSIS — C9140 Hairy cell leukemia not having achieved remission: Secondary | ICD-10-CM | POA: Diagnosis not present

## 2019-04-20 NOTE — Telephone Encounter (Signed)
I called pal line at [facility]- was told would receive return call approximately an hour ago- waiting for return call- hoping they received fax about patient's  metastatic disease potential

## 2019-04-21 DIAGNOSIS — R112 Nausea with vomiting, unspecified: Secondary | ICD-10-CM | POA: Diagnosis not present

## 2019-04-21 DIAGNOSIS — K7689 Other specified diseases of liver: Secondary | ICD-10-CM | POA: Diagnosis not present

## 2019-04-21 DIAGNOSIS — Z5111 Encounter for antineoplastic chemotherapy: Secondary | ICD-10-CM | POA: Diagnosis not present

## 2019-04-21 DIAGNOSIS — R160 Hepatomegaly, not elsewhere classified: Secondary | ICD-10-CM | POA: Diagnosis not present

## 2019-04-21 DIAGNOSIS — R6889 Other general symptoms and signs: Secondary | ICD-10-CM | POA: Diagnosis not present

## 2019-04-21 DIAGNOSIS — M1711 Unilateral primary osteoarthritis, right knee: Secondary | ICD-10-CM | POA: Diagnosis not present

## 2019-04-21 DIAGNOSIS — Z951 Presence of aortocoronary bypass graft: Secondary | ICD-10-CM | POA: Diagnosis not present

## 2019-04-21 DIAGNOSIS — R978 Other abnormal tumor markers: Secondary | ICD-10-CM | POA: Diagnosis not present

## 2019-04-21 DIAGNOSIS — L988 Other specified disorders of the skin and subcutaneous tissue: Secondary | ICD-10-CM | POA: Diagnosis not present

## 2019-04-21 DIAGNOSIS — I2510 Atherosclerotic heart disease of native coronary artery without angina pectoris: Secondary | ICD-10-CM | POA: Diagnosis not present

## 2019-04-21 DIAGNOSIS — C9142 Hairy cell leukemia, in relapse: Secondary | ICD-10-CM | POA: Diagnosis not present

## 2019-04-21 DIAGNOSIS — R918 Other nonspecific abnormal finding of lung field: Secondary | ICD-10-CM | POA: Diagnosis not present

## 2019-04-21 DIAGNOSIS — H3560 Retinal hemorrhage, unspecified eye: Secondary | ICD-10-CM | POA: Diagnosis not present

## 2019-04-21 DIAGNOSIS — E119 Type 2 diabetes mellitus without complications: Secondary | ICD-10-CM | POA: Diagnosis not present

## 2019-04-21 DIAGNOSIS — T451X5A Adverse effect of antineoplastic and immunosuppressive drugs, initial encounter: Secondary | ICD-10-CM | POA: Diagnosis not present

## 2019-04-21 DIAGNOSIS — R6883 Chills (without fever): Secondary | ICD-10-CM | POA: Diagnosis not present

## 2019-04-21 DIAGNOSIS — H3530 Unspecified macular degeneration: Secondary | ICD-10-CM | POA: Diagnosis not present

## 2019-04-22 ENCOUNTER — Encounter (INDEPENDENT_AMBULATORY_CARE_PROVIDER_SITE_OTHER): Payer: Medicare Other | Admitting: Ophthalmology

## 2019-04-25 DIAGNOSIS — R911 Solitary pulmonary nodule: Secondary | ICD-10-CM | POA: Diagnosis not present

## 2019-04-25 DIAGNOSIS — K7689 Other specified diseases of liver: Secondary | ICD-10-CM | POA: Diagnosis not present

## 2019-04-25 DIAGNOSIS — K5730 Diverticulosis of large intestine without perforation or abscess without bleeding: Secondary | ICD-10-CM | POA: Diagnosis not present

## 2019-04-25 DIAGNOSIS — R5081 Fever presenting with conditions classified elsewhere: Secondary | ICD-10-CM | POA: Diagnosis not present

## 2019-04-25 DIAGNOSIS — I252 Old myocardial infarction: Secondary | ICD-10-CM | POA: Diagnosis not present

## 2019-04-25 DIAGNOSIS — D61818 Other pancytopenia: Secondary | ICD-10-CM | POA: Diagnosis not present

## 2019-04-25 DIAGNOSIS — D709 Neutropenia, unspecified: Secondary | ICD-10-CM | POA: Diagnosis not present

## 2019-04-25 DIAGNOSIS — I2510 Atherosclerotic heart disease of native coronary artery without angina pectoris: Secondary | ICD-10-CM | POA: Diagnosis not present

## 2019-04-25 DIAGNOSIS — C9140 Hairy cell leukemia not having achieved remission: Secondary | ICD-10-CM | POA: Diagnosis not present

## 2019-04-25 DIAGNOSIS — R935 Abnormal findings on diagnostic imaging of other abdominal regions, including retroperitoneum: Secondary | ICD-10-CM | POA: Diagnosis not present

## 2019-04-25 DIAGNOSIS — D126 Benign neoplasm of colon, unspecified: Secondary | ICD-10-CM | POA: Diagnosis not present

## 2019-04-25 DIAGNOSIS — C7800 Secondary malignant neoplasm of unspecified lung: Secondary | ICD-10-CM | POA: Diagnosis not present

## 2019-04-25 DIAGNOSIS — D122 Benign neoplasm of ascending colon: Secondary | ICD-10-CM | POA: Diagnosis not present

## 2019-04-25 DIAGNOSIS — J189 Pneumonia, unspecified organism: Secondary | ICD-10-CM | POA: Diagnosis not present

## 2019-04-25 DIAGNOSIS — J309 Allergic rhinitis, unspecified: Secondary | ICD-10-CM | POA: Diagnosis not present

## 2019-04-25 DIAGNOSIS — C787 Secondary malignant neoplasm of liver and intrahepatic bile duct: Secondary | ICD-10-CM | POA: Diagnosis not present

## 2019-04-25 DIAGNOSIS — C9142 Hairy cell leukemia, in relapse: Secondary | ICD-10-CM | POA: Diagnosis not present

## 2019-04-25 DIAGNOSIS — Z20822 Contact with and (suspected) exposure to covid-19: Secondary | ICD-10-CM | POA: Diagnosis not present

## 2019-04-25 DIAGNOSIS — R918 Other nonspecific abnormal finding of lung field: Secondary | ICD-10-CM | POA: Diagnosis not present

## 2019-04-25 DIAGNOSIS — D696 Thrombocytopenia, unspecified: Secondary | ICD-10-CM | POA: Diagnosis not present

## 2019-04-25 DIAGNOSIS — C220 Liver cell carcinoma: Secondary | ICD-10-CM | POA: Diagnosis not present

## 2019-04-25 DIAGNOSIS — R160 Hepatomegaly, not elsewhere classified: Secondary | ICD-10-CM | POA: Diagnosis not present

## 2019-04-25 DIAGNOSIS — I714 Abdominal aortic aneurysm, without rupture: Secondary | ICD-10-CM | POA: Diagnosis not present

## 2019-04-25 DIAGNOSIS — Z87891 Personal history of nicotine dependence: Secondary | ICD-10-CM | POA: Diagnosis not present

## 2019-04-27 DIAGNOSIS — C787 Secondary malignant neoplasm of liver and intrahepatic bile duct: Secondary | ICD-10-CM | POA: Diagnosis not present

## 2019-04-27 DIAGNOSIS — R918 Other nonspecific abnormal finding of lung field: Secondary | ICD-10-CM | POA: Diagnosis not present

## 2019-04-27 DIAGNOSIS — C220 Liver cell carcinoma: Secondary | ICD-10-CM | POA: Diagnosis not present

## 2019-04-27 DIAGNOSIS — K7689 Other specified diseases of liver: Secondary | ICD-10-CM | POA: Diagnosis not present

## 2019-04-27 LAB — HM COLONOSCOPY

## 2019-04-29 ENCOUNTER — Telehealth: Payer: Self-pay | Admitting: Family Medicine

## 2019-04-29 NOTE — Telephone Encounter (Signed)
There is an 840 visit on the 20th that can be used-list as hospital follow-up please.

## 2019-04-29 NOTE — Telephone Encounter (Signed)
See below

## 2019-04-29 NOTE — Telephone Encounter (Signed)
Kenney Houseman is calling from [facility] and is requesting a hospital follow up for 2-4 weeks, next available is not until 06/07/19

## 2019-04-29 NOTE — Telephone Encounter (Signed)
Can pt be worked in sooner?

## 2019-05-02 ENCOUNTER — Other Ambulatory Visit (HOSPITAL_COMMUNITY): Payer: Medicare Other

## 2019-05-02 DIAGNOSIS — R160 Hepatomegaly, not elsewhere classified: Secondary | ICD-10-CM | POA: Diagnosis not present

## 2019-05-02 DIAGNOSIS — R978 Other abnormal tumor markers: Secondary | ICD-10-CM | POA: Diagnosis not present

## 2019-05-02 DIAGNOSIS — C9142 Hairy cell leukemia, in relapse: Secondary | ICD-10-CM | POA: Diagnosis not present

## 2019-05-02 NOTE — Telephone Encounter (Signed)
Ok per [facility]

## 2019-05-02 NOTE — Telephone Encounter (Signed)
Ok to put in?

## 2019-05-02 NOTE — Telephone Encounter (Signed)
Called patient and he stated that he is unable to do morning appointments. Patient preferred Wednesdays, could we fit patient in at 11:20 on 05/04/19?

## 2019-05-02 NOTE — Telephone Encounter (Signed)
Done

## 2019-05-02 NOTE — Telephone Encounter (Signed)
Yes thanks 

## 2019-05-04 ENCOUNTER — Inpatient Hospital Stay: Payer: Medicare Other | Admitting: Family Medicine

## 2019-05-05 DIAGNOSIS — C9142 Hairy cell leukemia, in relapse: Secondary | ICD-10-CM | POA: Diagnosis not present

## 2019-05-05 DIAGNOSIS — Z5111 Encounter for antineoplastic chemotherapy: Secondary | ICD-10-CM | POA: Diagnosis not present

## 2019-05-06 ENCOUNTER — Other Ambulatory Visit: Payer: Self-pay | Admitting: Family Medicine

## 2019-05-06 DIAGNOSIS — Z5111 Encounter for antineoplastic chemotherapy: Secondary | ICD-10-CM | POA: Diagnosis not present

## 2019-05-06 DIAGNOSIS — C9142 Hairy cell leukemia, in relapse: Secondary | ICD-10-CM | POA: Diagnosis not present

## 2019-05-09 DIAGNOSIS — Z79899 Other long term (current) drug therapy: Secondary | ICD-10-CM | POA: Diagnosis not present

## 2019-05-09 DIAGNOSIS — C221 Intrahepatic bile duct carcinoma: Secondary | ICD-10-CM | POA: Diagnosis not present

## 2019-05-09 DIAGNOSIS — R160 Hepatomegaly, not elsewhere classified: Secondary | ICD-10-CM | POA: Diagnosis not present

## 2019-05-09 DIAGNOSIS — R197 Diarrhea, unspecified: Secondary | ICD-10-CM | POA: Diagnosis not present

## 2019-05-09 DIAGNOSIS — R112 Nausea with vomiting, unspecified: Secondary | ICD-10-CM | POA: Diagnosis not present

## 2019-05-09 DIAGNOSIS — R918 Other nonspecific abnormal finding of lung field: Secondary | ICD-10-CM | POA: Diagnosis not present

## 2019-05-09 DIAGNOSIS — C9140 Hairy cell leukemia not having achieved remission: Secondary | ICD-10-CM | POA: Diagnosis not present

## 2019-05-18 DIAGNOSIS — R160 Hepatomegaly, not elsewhere classified: Secondary | ICD-10-CM | POA: Diagnosis not present

## 2019-05-19 DIAGNOSIS — C9142 Hairy cell leukemia, in relapse: Secondary | ICD-10-CM | POA: Diagnosis not present

## 2019-05-19 DIAGNOSIS — K769 Liver disease, unspecified: Secondary | ICD-10-CM | POA: Diagnosis not present

## 2019-05-19 DIAGNOSIS — I2510 Atherosclerotic heart disease of native coronary artery without angina pectoris: Secondary | ICD-10-CM | POA: Diagnosis not present

## 2019-05-19 DIAGNOSIS — H3560 Retinal hemorrhage, unspecified eye: Secondary | ICD-10-CM | POA: Diagnosis not present

## 2019-05-19 DIAGNOSIS — M1711 Unilateral primary osteoarthritis, right knee: Secondary | ICD-10-CM | POA: Diagnosis not present

## 2019-05-19 DIAGNOSIS — I25810 Atherosclerosis of coronary artery bypass graft(s) without angina pectoris: Secondary | ICD-10-CM | POA: Diagnosis not present

## 2019-05-19 DIAGNOSIS — Z7984 Long term (current) use of oral hypoglycemic drugs: Secondary | ICD-10-CM | POA: Diagnosis not present

## 2019-05-19 DIAGNOSIS — Z951 Presence of aortocoronary bypass graft: Secondary | ICD-10-CM | POA: Diagnosis not present

## 2019-05-19 DIAGNOSIS — R918 Other nonspecific abnormal finding of lung field: Secondary | ICD-10-CM | POA: Diagnosis not present

## 2019-05-19 DIAGNOSIS — Z79899 Other long term (current) drug therapy: Secondary | ICD-10-CM | POA: Diagnosis not present

## 2019-05-19 DIAGNOSIS — H3530 Unspecified macular degeneration: Secondary | ICD-10-CM | POA: Diagnosis not present

## 2019-05-19 DIAGNOSIS — D61818 Other pancytopenia: Secondary | ICD-10-CM | POA: Diagnosis not present

## 2019-05-19 DIAGNOSIS — L988 Other specified disorders of the skin and subcutaneous tissue: Secondary | ICD-10-CM | POA: Diagnosis not present

## 2019-05-19 DIAGNOSIS — Z5111 Encounter for antineoplastic chemotherapy: Secondary | ICD-10-CM | POA: Diagnosis not present

## 2019-05-19 DIAGNOSIS — Z87891 Personal history of nicotine dependence: Secondary | ICD-10-CM | POA: Diagnosis not present

## 2019-05-19 DIAGNOSIS — E119 Type 2 diabetes mellitus without complications: Secondary | ICD-10-CM | POA: Diagnosis not present

## 2019-05-19 DIAGNOSIS — R160 Hepatomegaly, not elsewhere classified: Secondary | ICD-10-CM | POA: Diagnosis not present

## 2019-05-23 DIAGNOSIS — C229 Malignant neoplasm of liver, not specified as primary or secondary: Secondary | ICD-10-CM | POA: Diagnosis not present

## 2019-05-25 DIAGNOSIS — E1165 Type 2 diabetes mellitus with hyperglycemia: Secondary | ICD-10-CM | POA: Diagnosis not present

## 2019-05-25 DIAGNOSIS — C9140 Hairy cell leukemia not having achieved remission: Secondary | ICD-10-CM | POA: Diagnosis not present

## 2019-05-25 DIAGNOSIS — K1370 Unspecified lesions of oral mucosa: Secondary | ICD-10-CM | POA: Diagnosis not present

## 2019-05-25 DIAGNOSIS — R978 Other abnormal tumor markers: Secondary | ICD-10-CM | POA: Diagnosis not present

## 2019-05-25 DIAGNOSIS — R05 Cough: Secondary | ICD-10-CM | POA: Diagnosis not present

## 2019-05-25 DIAGNOSIS — C221 Intrahepatic bile duct carcinoma: Secondary | ICD-10-CM | POA: Diagnosis not present

## 2019-05-25 DIAGNOSIS — J811 Chronic pulmonary edema: Secondary | ICD-10-CM | POA: Diagnosis not present

## 2019-05-25 DIAGNOSIS — R918 Other nonspecific abnormal finding of lung field: Secondary | ICD-10-CM | POA: Diagnosis not present

## 2019-05-25 DIAGNOSIS — Z87891 Personal history of nicotine dependence: Secondary | ICD-10-CM | POA: Diagnosis not present

## 2019-05-25 DIAGNOSIS — R112 Nausea with vomiting, unspecified: Secondary | ICD-10-CM | POA: Diagnosis not present

## 2019-05-25 DIAGNOSIS — R5383 Other fatigue: Secondary | ICD-10-CM | POA: Diagnosis not present

## 2019-05-25 DIAGNOSIS — K1379 Other lesions of oral mucosa: Secondary | ICD-10-CM | POA: Diagnosis not present

## 2019-05-25 DIAGNOSIS — F1099 Alcohol use, unspecified with unspecified alcohol-induced disorder: Secondary | ICD-10-CM | POA: Diagnosis not present

## 2019-05-25 DIAGNOSIS — Z79899 Other long term (current) drug therapy: Secondary | ICD-10-CM | POA: Diagnosis not present

## 2019-05-25 DIAGNOSIS — R197 Diarrhea, unspecified: Secondary | ICD-10-CM | POA: Diagnosis not present

## 2019-05-30 ENCOUNTER — Other Ambulatory Visit: Payer: Self-pay | Admitting: Family Medicine

## 2019-06-02 DIAGNOSIS — C229 Malignant neoplasm of liver, not specified as primary or secondary: Secondary | ICD-10-CM | POA: Diagnosis not present

## 2019-06-02 DIAGNOSIS — I2510 Atherosclerotic heart disease of native coronary artery without angina pectoris: Secondary | ICD-10-CM | POA: Diagnosis not present

## 2019-06-02 DIAGNOSIS — D696 Thrombocytopenia, unspecified: Secondary | ICD-10-CM | POA: Diagnosis not present

## 2019-06-02 DIAGNOSIS — Z951 Presence of aortocoronary bypass graft: Secondary | ICD-10-CM | POA: Diagnosis not present

## 2019-06-02 DIAGNOSIS — R110 Nausea: Secondary | ICD-10-CM | POA: Diagnosis not present

## 2019-06-02 DIAGNOSIS — L989 Disorder of the skin and subcutaneous tissue, unspecified: Secondary | ICD-10-CM | POA: Diagnosis not present

## 2019-06-02 DIAGNOSIS — R1011 Right upper quadrant pain: Secondary | ICD-10-CM | POA: Diagnosis not present

## 2019-06-02 DIAGNOSIS — R197 Diarrhea, unspecified: Secondary | ICD-10-CM | POA: Diagnosis not present

## 2019-06-02 DIAGNOSIS — C9142 Hairy cell leukemia, in relapse: Secondary | ICD-10-CM | POA: Diagnosis not present

## 2019-06-02 DIAGNOSIS — D61818 Other pancytopenia: Secondary | ICD-10-CM | POA: Diagnosis not present

## 2019-06-02 DIAGNOSIS — Z79899 Other long term (current) drug therapy: Secondary | ICD-10-CM | POA: Diagnosis not present

## 2019-06-02 DIAGNOSIS — Z87891 Personal history of nicotine dependence: Secondary | ICD-10-CM | POA: Diagnosis not present

## 2019-06-02 DIAGNOSIS — Z5111 Encounter for antineoplastic chemotherapy: Secondary | ICD-10-CM | POA: Diagnosis not present

## 2019-06-02 DIAGNOSIS — T451X5A Adverse effect of antineoplastic and immunosuppressive drugs, initial encounter: Secondary | ICD-10-CM | POA: Diagnosis not present

## 2019-06-02 DIAGNOSIS — R160 Hepatomegaly, not elsewhere classified: Secondary | ICD-10-CM | POA: Diagnosis not present

## 2019-06-06 DIAGNOSIS — J383 Other diseases of vocal cords: Secondary | ICD-10-CM | POA: Diagnosis not present

## 2019-06-06 DIAGNOSIS — J384 Edema of larynx: Secondary | ICD-10-CM | POA: Diagnosis not present

## 2019-06-06 DIAGNOSIS — R05 Cough: Secondary | ICD-10-CM | POA: Diagnosis not present

## 2019-06-06 DIAGNOSIS — R1314 Dysphagia, pharyngoesophageal phase: Secondary | ICD-10-CM | POA: Diagnosis not present

## 2019-06-06 DIAGNOSIS — J387 Other diseases of larynx: Secondary | ICD-10-CM | POA: Diagnosis not present

## 2019-06-06 DIAGNOSIS — Z87891 Personal history of nicotine dependence: Secondary | ICD-10-CM | POA: Diagnosis not present

## 2019-06-10 DIAGNOSIS — R160 Hepatomegaly, not elsewhere classified: Secondary | ICD-10-CM | POA: Diagnosis not present

## 2019-06-10 DIAGNOSIS — R932 Abnormal findings on diagnostic imaging of liver and biliary tract: Secondary | ICD-10-CM | POA: Diagnosis not present

## 2019-06-10 DIAGNOSIS — R7989 Other specified abnormal findings of blood chemistry: Secondary | ICD-10-CM | POA: Diagnosis not present

## 2019-06-10 DIAGNOSIS — R29898 Other symptoms and signs involving the musculoskeletal system: Secondary | ICD-10-CM | POA: Diagnosis not present

## 2019-06-10 DIAGNOSIS — F10239 Alcohol dependence with withdrawal, unspecified: Secondary | ICD-10-CM | POA: Diagnosis not present

## 2019-06-10 DIAGNOSIS — R9431 Abnormal electrocardiogram [ECG] [EKG]: Secondary | ICD-10-CM | POA: Diagnosis not present

## 2019-06-10 DIAGNOSIS — C227 Other specified carcinomas of liver: Secondary | ICD-10-CM | POA: Diagnosis not present

## 2019-06-10 DIAGNOSIS — D649 Anemia, unspecified: Secondary | ICD-10-CM | POA: Diagnosis not present

## 2019-06-10 DIAGNOSIS — C221 Intrahepatic bile duct carcinoma: Secondary | ICD-10-CM | POA: Diagnosis not present

## 2019-06-10 DIAGNOSIS — R110 Nausea: Secondary | ICD-10-CM | POA: Diagnosis not present

## 2019-06-10 DIAGNOSIS — Z7984 Long term (current) use of oral hypoglycemic drugs: Secondary | ICD-10-CM | POA: Diagnosis not present

## 2019-06-10 DIAGNOSIS — Z79899 Other long term (current) drug therapy: Secondary | ICD-10-CM | POA: Diagnosis not present

## 2019-06-10 DIAGNOSIS — I444 Left anterior fascicular block: Secondary | ICD-10-CM | POA: Diagnosis not present

## 2019-06-10 DIAGNOSIS — I2510 Atherosclerotic heart disease of native coronary artery without angina pectoris: Secondary | ICD-10-CM | POA: Diagnosis not present

## 2019-06-10 DIAGNOSIS — I10 Essential (primary) hypertension: Secondary | ICD-10-CM | POA: Diagnosis not present

## 2019-06-10 DIAGNOSIS — C9140 Hairy cell leukemia not having achieved remission: Secondary | ICD-10-CM | POA: Diagnosis not present

## 2019-06-10 DIAGNOSIS — Z951 Presence of aortocoronary bypass graft: Secondary | ICD-10-CM | POA: Diagnosis not present

## 2019-06-10 DIAGNOSIS — E119 Type 2 diabetes mellitus without complications: Secondary | ICD-10-CM | POA: Diagnosis not present

## 2019-06-10 DIAGNOSIS — I4510 Unspecified right bundle-branch block: Secondary | ICD-10-CM | POA: Diagnosis not present

## 2019-06-11 DIAGNOSIS — E119 Type 2 diabetes mellitus without complications: Secondary | ICD-10-CM | POA: Diagnosis not present

## 2019-06-11 DIAGNOSIS — R160 Hepatomegaly, not elsewhere classified: Secondary | ICD-10-CM | POA: Diagnosis not present

## 2019-06-11 DIAGNOSIS — R9431 Abnormal electrocardiogram [ECG] [EKG]: Secondary | ICD-10-CM | POA: Diagnosis not present

## 2019-06-11 DIAGNOSIS — I10 Essential (primary) hypertension: Secondary | ICD-10-CM | POA: Diagnosis not present

## 2019-06-11 DIAGNOSIS — F10239 Alcohol dependence with withdrawal, unspecified: Secondary | ICD-10-CM | POA: Diagnosis not present

## 2019-06-11 DIAGNOSIS — Z951 Presence of aortocoronary bypass graft: Secondary | ICD-10-CM | POA: Diagnosis not present

## 2019-06-11 DIAGNOSIS — Z79899 Other long term (current) drug therapy: Secondary | ICD-10-CM | POA: Diagnosis not present

## 2019-06-11 DIAGNOSIS — C227 Other specified carcinomas of liver: Secondary | ICD-10-CM | POA: Diagnosis not present

## 2019-06-11 DIAGNOSIS — D649 Anemia, unspecified: Secondary | ICD-10-CM | POA: Diagnosis not present

## 2019-06-11 DIAGNOSIS — I2510 Atherosclerotic heart disease of native coronary artery without angina pectoris: Secondary | ICD-10-CM | POA: Diagnosis not present

## 2019-06-11 DIAGNOSIS — C9140 Hairy cell leukemia not having achieved remission: Secondary | ICD-10-CM | POA: Diagnosis not present

## 2019-06-11 DIAGNOSIS — Z7984 Long term (current) use of oral hypoglycemic drugs: Secondary | ICD-10-CM | POA: Diagnosis not present

## 2019-06-11 DIAGNOSIS — R110 Nausea: Secondary | ICD-10-CM | POA: Diagnosis not present

## 2019-06-11 DIAGNOSIS — R7989 Other specified abnormal findings of blood chemistry: Secondary | ICD-10-CM | POA: Diagnosis not present

## 2019-06-11 DIAGNOSIS — I444 Left anterior fascicular block: Secondary | ICD-10-CM | POA: Diagnosis not present

## 2019-06-11 DIAGNOSIS — R29898 Other symptoms and signs involving the musculoskeletal system: Secondary | ICD-10-CM | POA: Diagnosis not present

## 2019-06-11 DIAGNOSIS — I4510 Unspecified right bundle-branch block: Secondary | ICD-10-CM | POA: Diagnosis not present

## 2019-06-12 DIAGNOSIS — I4510 Unspecified right bundle-branch block: Secondary | ICD-10-CM | POA: Diagnosis not present

## 2019-06-12 DIAGNOSIS — E119 Type 2 diabetes mellitus without complications: Secondary | ICD-10-CM | POA: Diagnosis not present

## 2019-06-12 DIAGNOSIS — Z79899 Other long term (current) drug therapy: Secondary | ICD-10-CM | POA: Diagnosis not present

## 2019-06-12 DIAGNOSIS — I444 Left anterior fascicular block: Secondary | ICD-10-CM | POA: Diagnosis not present

## 2019-06-12 DIAGNOSIS — I10 Essential (primary) hypertension: Secondary | ICD-10-CM | POA: Diagnosis not present

## 2019-06-12 DIAGNOSIS — Z7984 Long term (current) use of oral hypoglycemic drugs: Secondary | ICD-10-CM | POA: Diagnosis not present

## 2019-06-12 DIAGNOSIS — C227 Other specified carcinomas of liver: Secondary | ICD-10-CM | POA: Diagnosis not present

## 2019-06-12 DIAGNOSIS — R29898 Other symptoms and signs involving the musculoskeletal system: Secondary | ICD-10-CM | POA: Diagnosis not present

## 2019-06-12 DIAGNOSIS — R160 Hepatomegaly, not elsewhere classified: Secondary | ICD-10-CM | POA: Diagnosis not present

## 2019-06-12 DIAGNOSIS — R7989 Other specified abnormal findings of blood chemistry: Secondary | ICD-10-CM | POA: Diagnosis not present

## 2019-06-12 DIAGNOSIS — D649 Anemia, unspecified: Secondary | ICD-10-CM | POA: Diagnosis not present

## 2019-06-12 DIAGNOSIS — C9140 Hairy cell leukemia not having achieved remission: Secondary | ICD-10-CM | POA: Diagnosis not present

## 2019-06-12 DIAGNOSIS — R110 Nausea: Secondary | ICD-10-CM | POA: Diagnosis not present

## 2019-06-12 DIAGNOSIS — R9431 Abnormal electrocardiogram [ECG] [EKG]: Secondary | ICD-10-CM | POA: Diagnosis not present

## 2019-06-12 DIAGNOSIS — Z951 Presence of aortocoronary bypass graft: Secondary | ICD-10-CM | POA: Diagnosis not present

## 2019-06-12 DIAGNOSIS — I2510 Atherosclerotic heart disease of native coronary artery without angina pectoris: Secondary | ICD-10-CM | POA: Diagnosis not present

## 2019-06-12 IMAGING — CR DG CHEST 2V
2 series · 2 of 2 positions shown · non-contrast
Comparison: Chest radiograph July 28, 2016.

CLINICAL DATA: Cough, hemoptysis for 1 day. History of
hypertension.

EXAM:
CHEST  2 VIEW

[x chest ap]
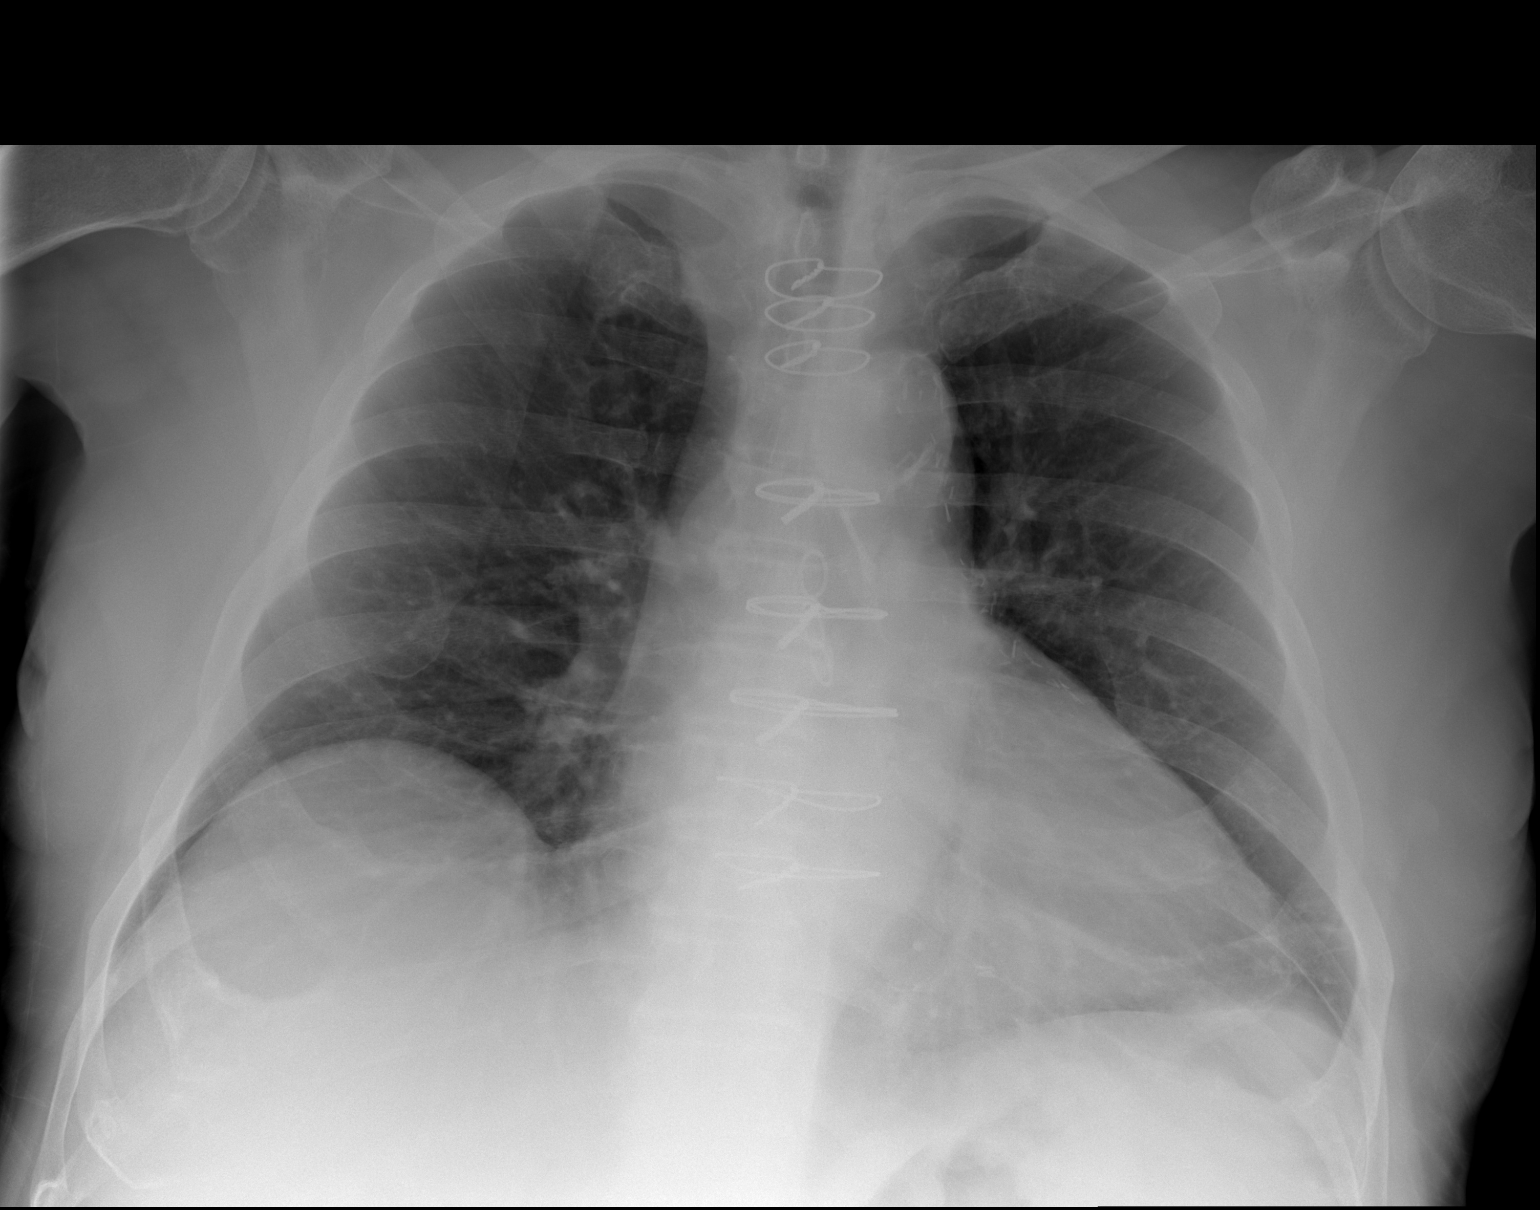

[w chest lat]
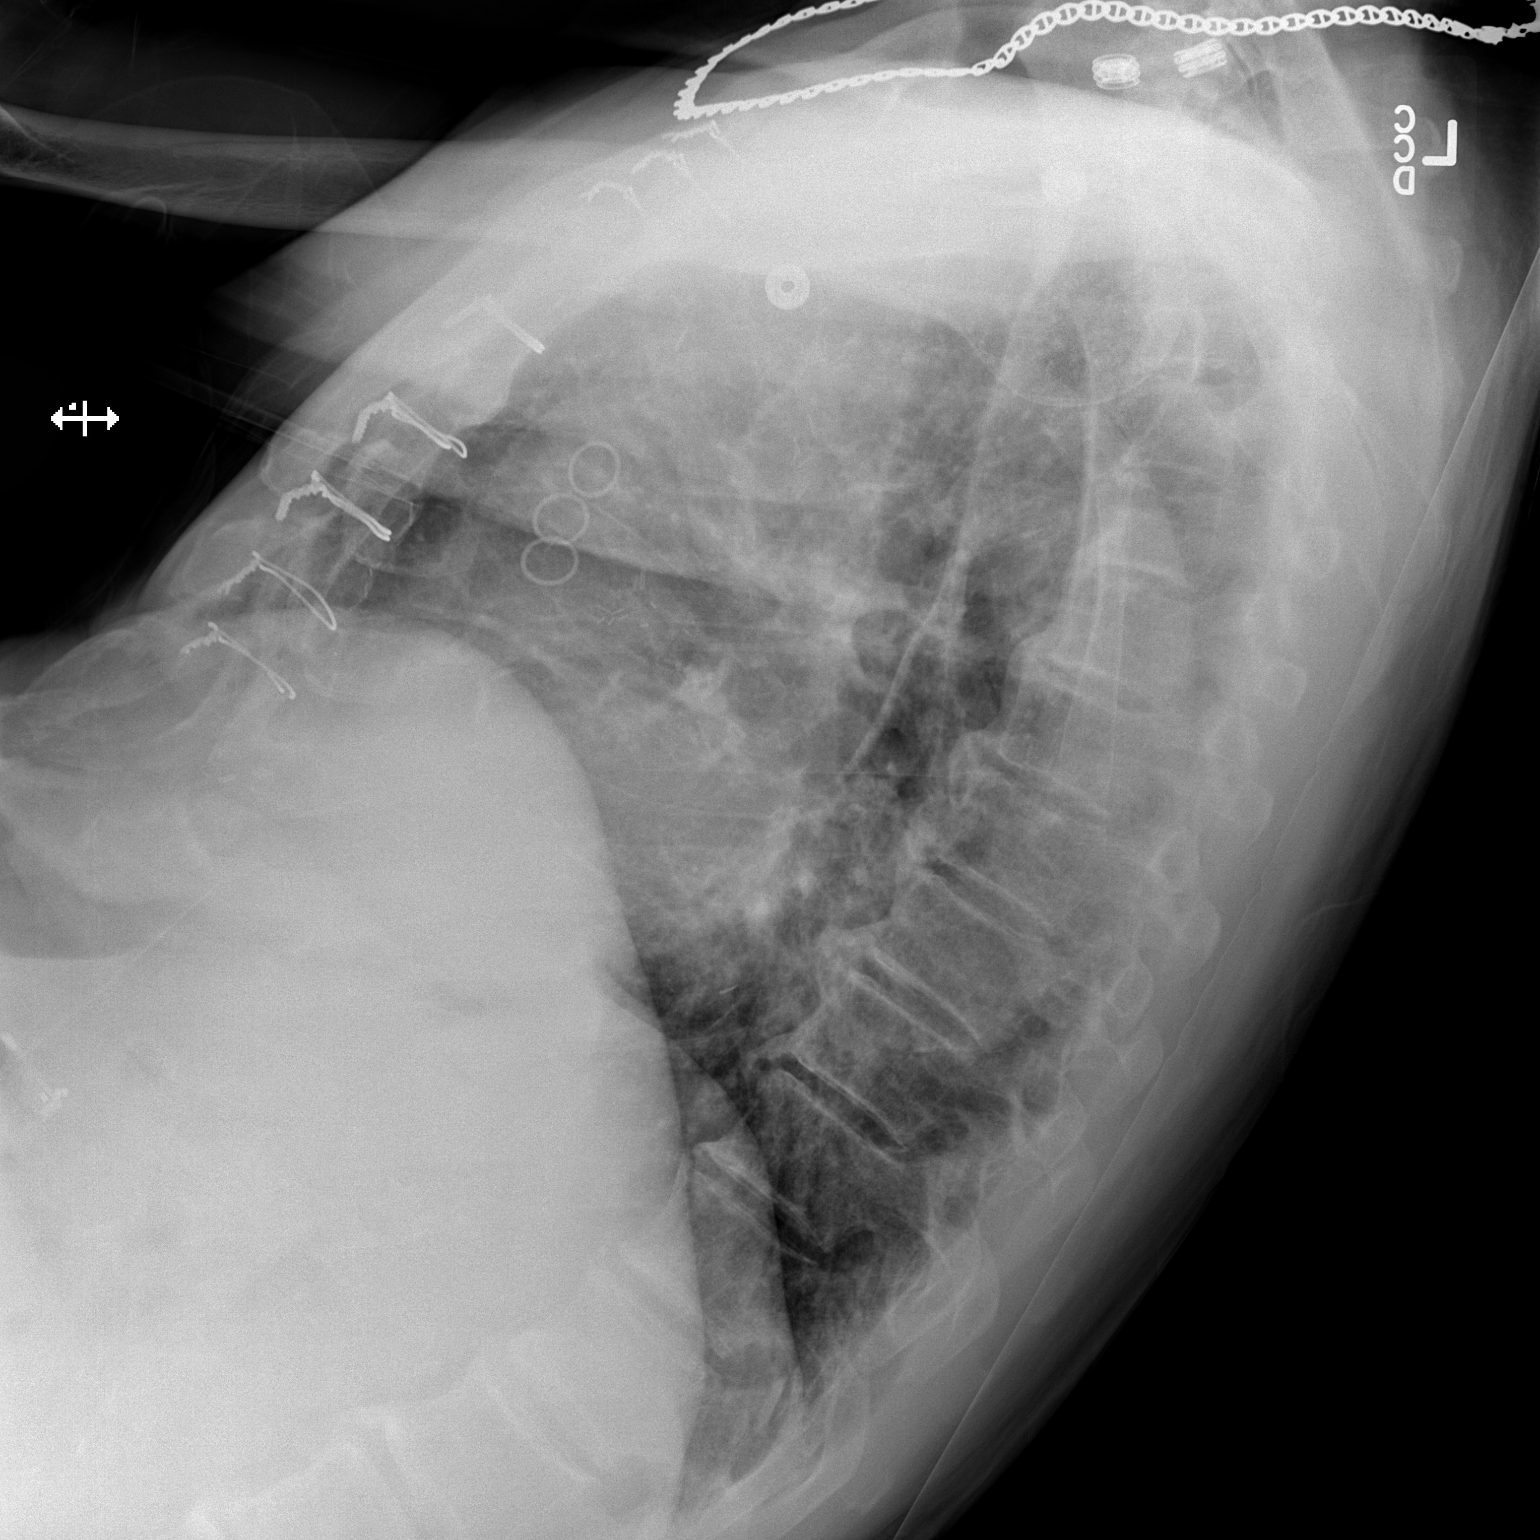

[2 of 2 positions shown; findings below may reference images not displayed]

FINDINGS: Cardiac silhouette is mildly enlarged and unchanged. Status post
median sternotomy for CABG. Calcified aortic knob. Mildly elevated
RIGHT hemidiaphragm with bibasilar strandy densities. No pleural
effusion or focal consolidation. No pneumothorax. Soft tissue planes
and included osseous structures are nonsuspicious. Mild degenerative
change of the thoracic spine.
IMPRESSION: Mild cardiomegaly.

Bibasilar atelectasis/scarring.

## 2019-06-15 DIAGNOSIS — C9140 Hairy cell leukemia not having achieved remission: Secondary | ICD-10-CM | POA: Diagnosis not present

## 2019-06-15 DIAGNOSIS — C220 Liver cell carcinoma: Secondary | ICD-10-CM | POA: Diagnosis not present

## 2019-06-15 DIAGNOSIS — I25810 Atherosclerosis of coronary artery bypass graft(s) without angina pectoris: Secondary | ICD-10-CM | POA: Diagnosis not present

## 2019-06-15 DIAGNOSIS — R918 Other nonspecific abnormal finding of lung field: Secondary | ICD-10-CM | POA: Diagnosis not present

## 2019-06-15 DIAGNOSIS — C787 Secondary malignant neoplasm of liver and intrahepatic bile duct: Secondary | ICD-10-CM | POA: Diagnosis not present

## 2019-06-15 DIAGNOSIS — C221 Intrahepatic bile duct carcinoma: Secondary | ICD-10-CM | POA: Diagnosis not present

## 2019-06-16 DIAGNOSIS — C9140 Hairy cell leukemia not having achieved remission: Secondary | ICD-10-CM | POA: Diagnosis not present

## 2019-06-16 DIAGNOSIS — C220 Liver cell carcinoma: Secondary | ICD-10-CM | POA: Diagnosis not present

## 2019-06-16 DIAGNOSIS — I25810 Atherosclerosis of coronary artery bypass graft(s) without angina pectoris: Secondary | ICD-10-CM | POA: Diagnosis not present

## 2019-06-16 DIAGNOSIS — R918 Other nonspecific abnormal finding of lung field: Secondary | ICD-10-CM | POA: Diagnosis not present

## 2019-06-17 DIAGNOSIS — I25810 Atherosclerosis of coronary artery bypass graft(s) without angina pectoris: Secondary | ICD-10-CM | POA: Diagnosis not present

## 2019-06-17 DIAGNOSIS — C220 Liver cell carcinoma: Secondary | ICD-10-CM | POA: Diagnosis not present

## 2019-06-17 DIAGNOSIS — R918 Other nonspecific abnormal finding of lung field: Secondary | ICD-10-CM | POA: Diagnosis not present

## 2019-06-17 DIAGNOSIS — C9140 Hairy cell leukemia not having achieved remission: Secondary | ICD-10-CM | POA: Diagnosis not present

## 2019-07-14 DEATH — deceased

## 2019-07-21 ENCOUNTER — Ambulatory Visit: Payer: Medicare Other | Admitting: Family Medicine
